# Patient Record
Sex: Female | Born: 1960 | Race: Black or African American | Hispanic: No | Marital: Single | Smoking: Never smoker
Health system: Southern US, Community
[De-identification: ages and names within clinical notes are randomized; demographics above are authoritative.]

## PROBLEM LIST (undated history)

## (undated) DIAGNOSIS — E785 Hyperlipidemia, unspecified: Secondary | ICD-10-CM

## (undated) DIAGNOSIS — Q039 Congenital hydrocephalus, unspecified: Secondary | ICD-10-CM

## (undated) DIAGNOSIS — F79 Unspecified intellectual disabilities: Secondary | ICD-10-CM

## (undated) DIAGNOSIS — K219 Gastro-esophageal reflux disease without esophagitis: Secondary | ICD-10-CM

## (undated) DIAGNOSIS — K5909 Other constipation: Secondary | ICD-10-CM

## (undated) DIAGNOSIS — G40909 Epilepsy, unspecified, not intractable, without status epilepticus: Secondary | ICD-10-CM

## (undated) DIAGNOSIS — D128 Benign neoplasm of rectum: Secondary | ICD-10-CM

## (undated) DIAGNOSIS — R569 Unspecified convulsions: Secondary | ICD-10-CM

## (undated) DIAGNOSIS — I10 Essential (primary) hypertension: Secondary | ICD-10-CM

## (undated) HISTORY — DX: Benign neoplasm of rectum: D12.8

## (undated) HISTORY — DX: Unspecified intellectual disabilities: F79

## (undated) HISTORY — DX: Epilepsy, unspecified, not intractable, without status epilepticus: G40.909

## (undated) HISTORY — DX: Essential (primary) hypertension: I10

## (undated) HISTORY — PX: OTHER SURGICAL HISTORY: SHX169

## (undated) HISTORY — DX: Congenital hydrocephalus, unspecified: Q03.9

## (undated) HISTORY — DX: Gastro-esophageal reflux disease without esophagitis: K21.9

## (undated) HISTORY — DX: Hyperlipidemia, unspecified: E78.5

## (undated) HISTORY — DX: Other constipation: K59.09

---

## 2000-09-21 ENCOUNTER — Encounter: Payer: Self-pay | Admitting: Family Medicine

## 2000-09-21 ENCOUNTER — Ambulatory Visit (HOSPITAL_COMMUNITY): Admission: RE | Admit: 2000-09-21 | Discharge: 2000-09-21 | Payer: Self-pay | Admitting: Family Medicine

## 2002-04-18 ENCOUNTER — Other Ambulatory Visit: Admission: RE | Admit: 2002-04-18 | Discharge: 2002-04-18 | Payer: Self-pay | Admitting: Obstetrics and Gynecology

## 2002-05-04 ENCOUNTER — Encounter: Payer: Self-pay | Admitting: Obstetrics and Gynecology

## 2002-05-04 ENCOUNTER — Ambulatory Visit (HOSPITAL_COMMUNITY): Admission: RE | Admit: 2002-05-04 | Discharge: 2002-05-04 | Payer: Self-pay | Admitting: Obstetrics and Gynecology

## 2002-05-11 ENCOUNTER — Encounter: Admission: RE | Admit: 2002-05-11 | Discharge: 2002-05-11 | Payer: Self-pay | Admitting: Obstetrics and Gynecology

## 2002-05-11 ENCOUNTER — Encounter: Payer: Self-pay | Admitting: Obstetrics and Gynecology

## 2003-04-16 ENCOUNTER — Ambulatory Visit (HOSPITAL_COMMUNITY): Admission: RE | Admit: 2003-04-16 | Discharge: 2003-04-16 | Payer: Self-pay | Admitting: Family Medicine

## 2003-04-23 ENCOUNTER — Emergency Department (HOSPITAL_COMMUNITY): Admission: EM | Admit: 2003-04-23 | Discharge: 2003-04-24 | Payer: Self-pay | Admitting: Emergency Medicine

## 2003-04-27 ENCOUNTER — Ambulatory Visit (HOSPITAL_COMMUNITY): Admission: RE | Admit: 2003-04-27 | Discharge: 2003-04-27 | Payer: Self-pay | Admitting: Internal Medicine

## 2003-04-30 ENCOUNTER — Ambulatory Visit (HOSPITAL_COMMUNITY): Admission: RE | Admit: 2003-04-30 | Discharge: 2003-04-30 | Payer: Self-pay | Admitting: Internal Medicine

## 2003-05-14 ENCOUNTER — Ambulatory Visit (HOSPITAL_COMMUNITY): Admission: RE | Admit: 2003-05-14 | Discharge: 2003-05-14 | Payer: Self-pay | Admitting: Internal Medicine

## 2003-08-14 ENCOUNTER — Ambulatory Visit (HOSPITAL_COMMUNITY): Admission: RE | Admit: 2003-08-14 | Discharge: 2003-08-14 | Payer: Self-pay | Admitting: Internal Medicine

## 2003-08-14 HISTORY — PX: FLEXIBLE SIGMOIDOSCOPY: SHX1649

## 2004-10-24 ENCOUNTER — Ambulatory Visit: Payer: Self-pay | Admitting: Internal Medicine

## 2004-10-24 ENCOUNTER — Encounter: Payer: Self-pay | Admitting: Family Medicine

## 2005-09-18 ENCOUNTER — Ambulatory Visit: Payer: Self-pay | Admitting: Family Medicine

## 2005-11-12 ENCOUNTER — Ambulatory Visit: Payer: Self-pay | Admitting: Family Medicine

## 2005-11-12 ENCOUNTER — Ambulatory Visit: Payer: Self-pay | Admitting: Internal Medicine

## 2005-12-25 ENCOUNTER — Ambulatory Visit: Payer: Self-pay | Admitting: Family Medicine

## 2006-02-08 ENCOUNTER — Encounter (INDEPENDENT_AMBULATORY_CARE_PROVIDER_SITE_OTHER): Payer: Self-pay | Admitting: *Deleted

## 2006-02-08 ENCOUNTER — Ambulatory Visit (HOSPITAL_COMMUNITY): Admission: RE | Admit: 2006-02-08 | Discharge: 2006-02-08 | Payer: Self-pay | Admitting: Internal Medicine

## 2006-02-08 ENCOUNTER — Ambulatory Visit: Payer: Self-pay | Admitting: Internal Medicine

## 2006-02-08 HISTORY — PX: COLONOSCOPY: SHX174

## 2006-04-27 ENCOUNTER — Encounter: Payer: Self-pay | Admitting: Family Medicine

## 2006-04-27 LAB — CONVERTED CEMR LAB
Albumin: 4.6 g/dL (ref 3.5–5.2)
Alkaline Phosphatase: 69 units/L (ref 39–117)
BUN: 5 mg/dL — ABNORMAL LOW (ref 6–23)
CO2: 27 meq/L (ref 19–32)
Chloride: 92 meq/L — ABNORMAL LOW (ref 96–112)
Creatinine, Ser: 0.63 mg/dL (ref 0.40–1.20)
HDL: 52 mg/dL (ref >39)
Indirect Bilirubin: 0.3 mg/dL (ref 0.0–0.9)
LDL Cholesterol: 93 mg/dL (ref 0–99)
Total Bilirubin: 0.4 mg/dL (ref 0.3–1.2)

## 2006-05-19 ENCOUNTER — Ambulatory Visit: Payer: Self-pay | Admitting: Family Medicine

## 2006-11-19 ENCOUNTER — Ambulatory Visit: Payer: Self-pay | Admitting: Internal Medicine

## 2006-11-19 ENCOUNTER — Encounter: Payer: Self-pay | Admitting: Family Medicine

## 2006-11-19 LAB — CONVERTED CEMR LAB
AST: 13 units/L (ref 0–37)
Alkaline Phosphatase: 104 units/L (ref 39–117)
BUN: 10 mg/dL (ref 6–23)
Basophils Absolute: 0 10*3/uL (ref 0.0–0.1)
Bilirubin, Direct: 0.1 mg/dL (ref 0.0–0.3)
CO2: 30 meq/L (ref 19–32)
Calcium: 9.9 mg/dL (ref 8.4–10.5)
Creatinine, Ser: 0.66 mg/dL (ref 0.40–1.20)
Eosinophils Relative: 2 % (ref 0–5)
Glucose, Bld: 94 mg/dL (ref 70–99)
HCT: 40.7 % (ref 36.0–46.0)
Hemoglobin: 13.6 g/dL (ref 12.0–15.0)
Indirect Bilirubin: 0.2 mg/dL (ref 0.0–0.9)
LDL Cholesterol: 92 mg/dL (ref 0–99)
Lymphocytes Relative: 34 % (ref 12–46)
Monocytes Absolute: 0.7 10*3/uL (ref 0.2–0.7)
Monocytes Relative: 10 % (ref 3–11)
RBC: 4.57 M/uL (ref 3.87–5.11)
RDW: 12.8 % (ref 11.5–14.0)
Total Bilirubin: 0.3 mg/dL (ref 0.3–1.2)

## 2006-12-06 ENCOUNTER — Ambulatory Visit: Payer: Self-pay | Admitting: Family Medicine

## 2007-01-20 ENCOUNTER — Encounter: Payer: Self-pay | Admitting: Family Medicine

## 2007-04-08 ENCOUNTER — Ambulatory Visit: Payer: Self-pay | Admitting: Family Medicine

## 2007-08-12 ENCOUNTER — Ambulatory Visit: Payer: Self-pay | Admitting: Family Medicine

## 2007-08-17 DIAGNOSIS — F79 Unspecified intellectual disabilities: Secondary | ICD-10-CM | POA: Insufficient documentation

## 2007-08-17 DIAGNOSIS — I10 Essential (primary) hypertension: Secondary | ICD-10-CM

## 2007-08-17 DIAGNOSIS — K5909 Other constipation: Secondary | ICD-10-CM

## 2007-08-17 DIAGNOSIS — Q039 Congenital hydrocephalus, unspecified: Secondary | ICD-10-CM | POA: Insufficient documentation

## 2007-08-17 DIAGNOSIS — E785 Hyperlipidemia, unspecified: Secondary | ICD-10-CM

## 2007-08-17 DIAGNOSIS — R569 Unspecified convulsions: Secondary | ICD-10-CM

## 2007-10-20 ENCOUNTER — Encounter: Payer: Self-pay | Admitting: Family Medicine

## 2007-11-18 ENCOUNTER — Ambulatory Visit: Payer: Self-pay | Admitting: Internal Medicine

## 2007-11-23 ENCOUNTER — Encounter: Payer: Self-pay | Admitting: Family Medicine

## 2007-12-03 ENCOUNTER — Encounter: Payer: Self-pay | Admitting: Family Medicine

## 2007-12-03 LAB — CONVERTED CEMR LAB
AST: 17 units/L (ref 0–37)
Albumin: 4.3 g/dL (ref 3.5–5.2)
Alkaline Phosphatase: 104 units/L (ref 39–117)
BUN: 9 mg/dL (ref 6–23)
Basophils Absolute: 0 10*3/uL (ref 0.0–0.1)
Basophils Relative: 0 % (ref 0–1)
CO2: 27 meq/L (ref 19–32)
Calcium: 9.4 mg/dL (ref 8.4–10.5)
Chloride: 95 meq/L — ABNORMAL LOW (ref 96–112)
Creatinine, Ser: 0.59 mg/dL (ref 0.40–1.20)
Eosinophils Relative: 2 % (ref 0–5)
HCT: 40.9 % (ref 36.0–46.0)
HDL: 41 mg/dL (ref >39)
Hemoglobin: 13.8 g/dL (ref 12.0–15.0)
Indirect Bilirubin: 0.2 mg/dL (ref 0.0–0.9)
LDL Cholesterol: 98 mg/dL (ref 0–99)
MCHC: 33.7 g/dL (ref 30.0–36.0)
Monocytes Absolute: 0.7 10*3/uL (ref 0.1–1.0)
Monocytes Relative: 9 % (ref 3–12)
RBC: 4.6 M/uL (ref 3.87–5.11)
RDW: 12.5 % (ref 11.5–15.5)
Total Bilirubin: 0.3 mg/dL (ref 0.3–1.2)
Triglycerides: 120 mg/dL (ref <150)

## 2007-12-09 ENCOUNTER — Ambulatory Visit: Payer: Self-pay | Admitting: Family Medicine

## 2007-12-23 ENCOUNTER — Ambulatory Visit: Payer: Self-pay | Admitting: Family Medicine

## 2008-02-01 ENCOUNTER — Encounter: Payer: Self-pay | Admitting: Family Medicine

## 2008-02-29 ENCOUNTER — Encounter: Payer: Self-pay | Admitting: Family Medicine

## 2008-04-02 ENCOUNTER — Encounter: Payer: Self-pay | Admitting: Family Medicine

## 2008-05-18 ENCOUNTER — Ambulatory Visit: Payer: Self-pay | Admitting: Family Medicine

## 2008-05-25 DIAGNOSIS — H6120 Impacted cerumen, unspecified ear: Secondary | ICD-10-CM

## 2008-05-30 ENCOUNTER — Encounter: Payer: Self-pay | Admitting: Family Medicine

## 2008-06-15 ENCOUNTER — Encounter: Payer: Self-pay | Admitting: Family Medicine

## 2008-06-19 LAB — CONVERTED CEMR LAB
ALT: 13 units/L (ref 0–35)
AST: 17 units/L (ref 0–37)
Albumin: 4.7 g/dL (ref 3.5–5.2)
Alkaline Phosphatase: 116 units/L (ref 39–117)
BUN: 9 mg/dL (ref 6–23)
Bilirubin, Direct: 0.1 mg/dL (ref 0.0–0.3)
CO2: 27 meq/L (ref 19–32)
Calcium: 9.8 mg/dL (ref 8.4–10.5)
Chloride: 98 meq/L (ref 96–112)
Cholesterol: 169 mg/dL (ref 0–200)
Creatinine, Ser: 0.72 mg/dL (ref 0.40–1.20)
Glucose, Bld: 81 mg/dL (ref 70–99)
HDL: 45 mg/dL (ref >39)
Indirect Bilirubin: 0.2 mg/dL (ref 0.0–0.9)
LDL Cholesterol: 107 mg/dL — ABNORMAL HIGH (ref 0–99)
Potassium: 4.2 meq/L (ref 3.5–5.3)
Sodium: 140 meq/L (ref 135–145)
Total Bilirubin: 0.3 mg/dL (ref 0.3–1.2)
Total CHOL/HDL Ratio: 3.8
Total Protein: 7.5 g/dL (ref 6.0–8.3)
Triglycerides: 86 mg/dL (ref <150)
VLDL: 17 mg/dL (ref 0–40)

## 2008-08-29 ENCOUNTER — Encounter: Payer: Self-pay | Admitting: Family Medicine

## 2008-10-15 ENCOUNTER — Encounter: Payer: Self-pay | Admitting: Family Medicine

## 2008-11-09 ENCOUNTER — Ambulatory Visit: Payer: Self-pay | Admitting: Family Medicine

## 2008-11-23 ENCOUNTER — Encounter: Payer: Self-pay | Admitting: Family Medicine

## 2008-11-29 DIAGNOSIS — K219 Gastro-esophageal reflux disease without esophagitis: Secondary | ICD-10-CM

## 2008-11-30 ENCOUNTER — Ambulatory Visit: Payer: Self-pay | Admitting: Gastroenterology

## 2008-11-30 DIAGNOSIS — Z860100 Personal history of colon polyps, unspecified: Secondary | ICD-10-CM | POA: Insufficient documentation

## 2008-12-04 ENCOUNTER — Telehealth (INDEPENDENT_AMBULATORY_CARE_PROVIDER_SITE_OTHER): Payer: Self-pay

## 2008-12-27 ENCOUNTER — Encounter: Payer: Self-pay | Admitting: Family Medicine

## 2009-04-12 ENCOUNTER — Ambulatory Visit: Payer: Self-pay | Admitting: Family Medicine

## 2009-04-17 ENCOUNTER — Telehealth: Payer: Self-pay | Admitting: Family Medicine

## 2009-04-17 ENCOUNTER — Encounter: Payer: Self-pay | Admitting: Family Medicine

## 2009-04-17 LAB — CONVERTED CEMR LAB
ALT: 21 units/L (ref 0–35)
Alkaline Phosphatase: 99 units/L (ref 39–117)
BUN: 9 mg/dL (ref 6–23)
Basophils Absolute: 0 10*3/uL (ref 0.0–0.1)
Bilirubin, Direct: 0.1 mg/dL (ref 0.0–0.3)
Cholesterol: 170 mg/dL (ref 0–200)
Creatinine, Ser: 0.74 mg/dL (ref 0.40–1.20)
Eosinophils Absolute: 0.2 10*3/uL (ref 0.0–0.7)
Eosinophils Relative: 2 % (ref 0–5)
Glucose, Bld: 97 mg/dL (ref 70–99)
HCT: 45.4 % (ref 36.0–46.0)
Hemoglobin: 14.8 g/dL (ref 12.0–15.0)
Indirect Bilirubin: 0.3 mg/dL (ref 0.0–0.9)
LDL Cholesterol: 111 mg/dL — ABNORMAL HIGH (ref 0–99)
Lymphocytes Relative: 36 % (ref 12–46)
MCHC: 32.6 g/dL (ref 30.0–36.0)
MCV: 91.9 fL (ref 78.0–100.0)
Monocytes Absolute: 0.6 10*3/uL (ref 0.1–1.0)
Platelets: 223 10*3/uL (ref 150–400)
Potassium: 3.8 meq/L (ref 3.5–5.3)
RDW: 12.8 % (ref 11.5–15.5)
VLDL: 21 mg/dL (ref 0–40)

## 2009-04-18 LAB — CONVERTED CEMR LAB: Vit D, 25-Hydroxy: <10 ng/mL — ABNORMAL LOW (ref 30–89)

## 2009-04-30 ENCOUNTER — Telehealth: Payer: Self-pay | Admitting: Family Medicine

## 2009-06-28 ENCOUNTER — Telehealth: Payer: Self-pay | Admitting: Family Medicine

## 2009-07-09 ENCOUNTER — Ambulatory Visit (HOSPITAL_COMMUNITY): Admission: RE | Admit: 2009-07-09 | Discharge: 2009-07-09 | Payer: Self-pay | Admitting: Family Medicine

## 2009-07-09 ENCOUNTER — Ambulatory Visit: Payer: Self-pay | Admitting: Family Medicine

## 2009-07-09 DIAGNOSIS — J209 Acute bronchitis, unspecified: Secondary | ICD-10-CM

## 2009-07-09 LAB — CONVERTED CEMR LAB
BUN: 8 mg/dL (ref 6–23)
CO2: 29 meq/L (ref 19–32)
Chloride: 98 meq/L (ref 96–112)
Eosinophils Relative: 1 % (ref 0–5)
Glucose, Bld: 119 mg/dL — ABNORMAL HIGH (ref 70–99)
HCT: 43.6 % (ref 36.0–46.0)
Hemoglobin: 14.4 g/dL (ref 12.0–15.0)
Lymphocytes Relative: 32 % (ref 12–46)
Lymphs Abs: 2.6 10*3/uL (ref 0.7–4.0)
MCV: 92.4 fL (ref 78.0–100.0)
Monocytes Absolute: 0.6 10*3/uL (ref 0.1–1.0)
Potassium: 4.3 meq/L (ref 3.5–5.3)
WBC: 8.3 10*3/uL (ref 4.0–10.5)

## 2009-08-17 ENCOUNTER — Encounter: Payer: Self-pay | Admitting: Family Medicine

## 2009-09-06 ENCOUNTER — Encounter: Payer: Self-pay | Admitting: Family Medicine

## 2009-10-18 ENCOUNTER — Ambulatory Visit: Payer: Self-pay | Admitting: Family Medicine

## 2009-10-21 ENCOUNTER — Encounter: Payer: Self-pay | Admitting: Family Medicine

## 2009-10-31 ENCOUNTER — Ambulatory Visit: Payer: Self-pay | Admitting: Family Medicine

## 2009-11-03 DIAGNOSIS — L819 Disorder of pigmentation, unspecified: Secondary | ICD-10-CM | POA: Insufficient documentation

## 2009-11-07 ENCOUNTER — Encounter (INDEPENDENT_AMBULATORY_CARE_PROVIDER_SITE_OTHER): Payer: Self-pay | Admitting: *Deleted

## 2009-11-21 ENCOUNTER — Telehealth: Payer: Self-pay | Admitting: Family Medicine

## 2009-11-21 DIAGNOSIS — L989 Disorder of the skin and subcutaneous tissue, unspecified: Secondary | ICD-10-CM | POA: Insufficient documentation

## 2009-11-27 ENCOUNTER — Encounter: Payer: Self-pay | Admitting: Family Medicine

## 2010-01-01 ENCOUNTER — Ambulatory Visit: Payer: Self-pay | Admitting: Internal Medicine

## 2010-02-18 NOTE — Progress Notes (Signed)
  Phone Note From Pharmacy   Caller: [facility]* Summary of Call: omeprazole requires pa insurance allowable is #90 every year Initial call taken by: Adella Hare LPN,  April 30, 2009 8:42 AM  Follow-up for Phone Call        pls check and se if they will do omeprazole 20mg  Take 1 capsule by mouth once a day #30 refill 11, a l;ower dose, if so let family knowwhy doseis decresed , and they should call if she appears to dev stomach pain Follow-up by: Syliva Overman MD,  April 30, 2009 12:11 PM  Additional Follow-up for Phone Call Additional follow up Details #1::        this is dose patient is currently on Additional Follow-up by: Adella Hare LPN,  April 30, 2009 4:01 PM    Additional Follow-up for Phone Call Additional follow up Details #2::    trial of  ranitidine in place of omeprazole, plsadvise pt and pharmacy, stamp and fax Follow-up by: Syliva Overman MD,  April 30, 2009 5:14 PM  Additional Follow-up for Phone Call Additional follow up Details #3:: Details for Additional Follow-up Action Taken: sent rx, caretaker aware Additional Follow-up by: Adella Hare LPN,  May 01, 2009 11:50 AM  New/Updated Medications: RANITIDINE HCL 150 MG TABS (RANITIDINE HCL) Take 1 tablet by mouth once a day Prescriptions: RANITIDINE HCL 150 MG TABS (RANITIDINE HCL) Take 1 tablet by mouth once a day  #30 x 11   Entered and Authorized by:   Syliva Overman MD   Signed by:   Syliva Overman MD on 04/30/2009   Method used:   Printed then faxed to ...       [facility] (retail)       [address]       [place], [place]  [address]       Ph: [phone]       Fax: [phone]   RxID:   [id number]

## 2010-02-18 NOTE — Letter (Signed)
Summary: med review sheet  med review sheet   Imported By: Rudene Anda 11/01/2009 15:55:51  _____________________________________________________________________  External Attachment:    Type:   Image     Comment:   External Document

## 2010-02-18 NOTE — Progress Notes (Signed)
Summary: CALL BACK  Phone Note Call from Patient   Summary of Call: [name] WANTS YOU TO CALL HER ABOUT [place] Initial call taken by: Lind Guest,  April 17, 2009 9:29 AM  Follow-up for Phone Call        spoke with her already  Follow-up by: Everitt Amber LPN,  April 17, 2009 12:59 PM

## 2010-02-18 NOTE — Progress Notes (Signed)
Summary: COUGH  Phone Note Call from Patient   Summary of Call: [name] CALLED AND SHE HAS A COUGH CONGESTED AND  NO FEVER OR ANYTHING SHE HAS NOBODY TO HELP BRING HER HERE HER BROTHER IS OUT OF TOWN AND WANTS TO KNOW WILL YOU CALL SOMETHING INTO [place] [facility] CALL BACK AT [phone] Initial call taken by: Lind Guest,  June 28, 2009 8:41 AM  Follow-up for Phone Call        advise her i wiill sendin tessalon perles, but this is probably not covered, if shethinks it's too much, she can give her robitussin dm one tsp 3 times daily for 5 days, then as needed Follow-up by: Syliva Overman MD,  June 28, 2009 12:21 PM  Additional Follow-up for Phone Call Additional follow up Details #1::        Aware Additional Follow-up by: Everitt Amber LPN,  June 28, 2009 4:08 PM    New/Updated Medications: TESSALON PERLES 100 MG CAPS (BENZONATATE) Take 1 capsule by mouth three times a day as needed Prescriptions: TESSALON PERLES 100 MG CAPS (BENZONATATE) Take 1 capsule by mouth three times a day as needed  #42 x 0   Entered and Authorized by:   Syliva Overman MD   Signed by:   Syliva Overman MD on 06/28/2009   Method used:   Electronically to        [facility] (retail)       [address]       [place], [place]  [address]       Ph: [phone]       Fax: [phone]   RxID:   [id number]

## 2010-02-18 NOTE — Letter (Signed)
Summary: CAPS  CAPS   Imported By: Lind Guest 10/22/2009 13:26:29  _____________________________________________________________________  External Attachment:    Type:   Image     Comment:   External Document

## 2010-02-18 NOTE — Medication Information (Signed)
Summary: wheelchair  wheelchair   Imported By: Curtis Sites 08/22/2009 14:45:58  _____________________________________________________________________  External Attachment:    Type:   Image     Comment:   External Document

## 2010-02-18 NOTE — Miscellaneous (Signed)
  Clinical Lists Changes  Medications: Removed medication of VITAMIN D (ERGOCALCIFEROL) 50000 UNIT CAPS (ERGOCALCIFEROL) one cap by mouth every week

## 2010-02-18 NOTE — Letter (Signed)
Summary: gastroerterology  gastroerterology   Imported By: Lind Guest 10/17/2009 15:07:43  _____________________________________________________________________  External Attachment:    Type:   Image     Comment:   External Document

## 2010-02-18 NOTE — Medication Information (Signed)
Summary: wheelchair   wheelchair   Imported By: Curtis Sites 08/20/2009 13:49:26  _____________________________________________________________________  External Attachment:    Type:   Image     Comment:   External Document

## 2010-02-18 NOTE — Assessment & Plan Note (Signed)
Summary: cough   Vital Signs:  Patient profile:   50 year old female O2 Sat:      97 % Pulse rate:   71 / minute Pulse rhythm:   regular Resp:     16 per minute BP sitting:   122 / 80  (left arm)  Vitals Entered By: Everitt Amber LPN (July 09, 2009 11:11 AM) CC: has a cough and is unable to cough anything up. Has been going on for over a week. The pills that were called in didn't help   Primary Care Provider:  Syliva Overman, MD  CC:  has a cough and is unable to cough anything up. Has been going on for over a week. The pills that were called in didn't help.  History of Present Illness: 10 day h/o chest congestion , no response top decongestants, apetite somewaht reduced. c/o left angle of mouth and rash around mouth x 5 days. no fevre or chills noted, energy level appears unchanged. Reports  that prior to this she had been doing well. Denies recent fever or chills. Denies sinus pressure, or nasal congestion ,  . Denies , orthopnea or leg swelling. Denies abdominal pain, nausea, vomitting, diarrhea or constipation. Denies change in bowel movements or bloody stool. Denies dysuria or  frequency, chronic reduced mobility of lower ext with lower ext weakness, and inability to weight bear or ambulate Denies, seizures. Denies depression, anxiety or insomnia.      Allergies (verified): No Known Drug Allergies  Review of Systems      See HPI Eyes:  Denies discharge and red eye. Derm:  Complains of itching, lesion(s), and rash; red rash around lower mouth x approx 5 days, and itching at corners of the mouth. Heme:  Denies abnormal bruising and bleeding.  Physical Exam  General:  alert, well-nourished, and well-hydrated.hydrocephalus. HEENT: No facial asymmetry,  EOMI, No sinus tenderness, TM's cler  bilaterally, oropharynx  pink and moist. Neck supple , no adenopathy  Chest: dewcreased air entry in the bases , with bibasilar crackles, no wheezes CVS: S1, S2, No murmurs,  No S3.   Abd: Soft, Nontender.  MS: decreaseede  ROM spine, hips, shoulders and knees.  Ext: No edema.   CNS: no facial assymetry, EOMI, vision grossly normal, hearing grossly normal,sensation normal throughout.Decreased power and tone.   Skin: Intact, erythematous maculo[papular rash on chin.  Psych: Good eye contact, normal affect.  , not anxious or depressed appearing.      Impression & Recommendations:  Problem # 1:  ACUTE BRONCHITIS (ICD-466.0) Assessment Comment Only  Her updated medication list for this problem includes:    Tessalon Perles 100 Mg Caps (Benzonatate) .Marland Kitchen... Take 1 capsule by mouth three times a day as needed    Penicillin V Potassium 500 Mg Tabs (Penicillin v potassium) .Marland Kitchen... Take 1 tablet by mouth three times a day  Orders: CXR- 2view (CXR) T-CBC w/Diff ([id number]) T-Basic Metabolic Panel ([id number]) Rocephin  250mg  (G9562) Admin of Therapeutic Inj  intramuscular or subcutaneous (13086) Albuterol Sulfate Sol 1mg  unit dose (V7846) Atrovent 1mg  (Neb) (N6295) Nebulizer Tx (28413)  Problem # 2:  UNSPECIFIED CONSTIPATION (ICD-564.00) Assessment: Improved  Her updated medication list for this problem includes:    Docusate Sodium 100 Mg Caps (Docusate sodium) .Marland Kitchen... Take 1 tablet by mouth two times a day  Problem # 3:  SEIZURE DISORDER (ICD-780.39) Assessment: Unchanged  Her updated medication list for this problem includes:    Phenobarbital 30 Mg Tabs (Phenobarbital) .Marland Kitchen... Take 1  tablet by mouth two times a day no seizure activity  Problem # 4:  HYPERTENSION (ICD-401.9) Assessment: Unchanged  Her updated medication list for this problem includes:    Bisoprolol-hydrochlorothiazide 5-6.25 Mg Tabs (Bisoprolol-hydrochlorothiazide) .Marland Kitchen... Take 1 tablet by mouth once a day  Orders: T-Basic Metabolic Panel [phone])  BP today: 122/80 Prior BP: 120/80 (04/12/2009)  Labs Reviewed: K+: 3.8 (04/12/2009) Creat: : 0.74 (04/12/2009)   Chol: 170  (04/12/2009)   HDL: 38 (04/12/2009)   LDL: 111 (04/12/2009)   TG: 103 (04/12/2009)  Complete Medication List: 1)  Polyethylene Glycol 3350 Liqd (Polyethylene glycol 3350) .... Swallow 17gm in 8oz h20 twice daily 2)  Simvastatin 10 Mg Tabs (Simvastatin) .... Take 1 tab by mouth at bedtime 3)  Docusate Sodium 100 Mg Caps (Docusate sodium) .... Take 1 tablet by mouth two times a day 4)  Bisoprolol-hydrochlorothiazide 5-6.25 Mg Tabs (Bisoprolol-hydrochlorothiazide) .... Take 1 tablet by mouth once a day 5)  Klor-con M10 10 Meq Cr-tabs (Potassium chloride crys cr) .... Take 1 tablet by mouth once a day 6)  Phenobarbital 30 Mg Tabs (Phenobarbital) .... Take 1 tablet by mouth two times a day 7)  Vitamin D (ergocalciferol) 50000 Unit Caps (Ergocalciferol) .... One cap by mouth every week 8)  Ranitidine Hcl 150 Mg Tabs (Ranitidine hcl) .... Take 1 tablet by mouth once a day 9)  Tessalon Perles 100 Mg Caps (Benzonatate) .... Take 1 capsule by mouth three times a day as needed 10)  Penicillin V Potassium 500 Mg Tabs (Penicillin v potassium) .... Take 1 tablet by mouth three times a day 11)  Prednisone 5 Mg Tabs (Prednisone) .... One tablet twice daily for 2 days then one tablet daily for 3 days  Patient Instructions: 1)  f/u as before. 2)  You are being treated for acute bronchitis. 3)  You will get a breathing treatment and an injection of Rocephin in the office today. Meds are als sent to [facility], It is vital that you take them all. 4)  For the rash on your face use a small quantity of hydrocortisone cream twice daily for the next 5 days. 5)  you may use pure vaseline to the corner of you mouth till it stops hurting, use 2 to 3 times daily   6)  CBC and diff and chem 7 today 7)  CXR today if possible Prescriptions: PREDNISONE 5 MG TABS (PREDNISONE) one tablet twice daily for 2 days then one tablet daily for 3 days  #7 x 0   Entered and Authorized by:   Syliva Overman MD   Signed by:    Syliva Overman MD on 07/09/2009   Method used:   Electronically to        [facility] (retail)       [address]       [place], [place]  [address]       Ph: [phone]       Fax: [phone]   RxID:   [id number] PENICILLIN V POTASSIUM 500 MG TABS (PENICILLIN V POTASSIUM) Take 1 tablet by mouth three times a day  #30 x 0   Entered and Authorized by:   Syliva Overman MD   Signed by:   Syliva Overman MD on 07/09/2009   Method used:   Electronically to        [facility] (retail)       [address]  [place], [place]  [address]       Ph: [phone]       Fax: [phone]   RxID:   [id number]    Medication Administration  Injection # 1:    Medication: Rocephin  250mg     Diagnosis: ACUTE BRONCHITIS (ICD-466.0)    Route: IM    Site: R deltoid    Exp Date: 4/13    Lot #: [id number]    Mfr: [email or web address]    Comments: rocephin 500mg  given    Patient tolerated injection without complications    Given by: Adella Hare LPN (July 09, 2009 12:05 PM)  Medication # 1:    Medication: Albuterol Sulfate Sol 1mg  unit dose    Diagnosis: ACUTE BRONCHITIS (ICD-466.0)    Dose: 2.5/57ml    Route: inhaled    Exp Date: 8/11    Lot #: [id number]    Mfr: nephron    Patient tolerated medication without complications    Given by: Everitt Amber LPN (July 10, 2009 9:03 AM)  Medication # 2:    Medication: Atrovent 1mg  (Neb)    Diagnosis: ACUTE BRONCHITIS (ICD-466.0)    Dose: 0.5/2.35ml    Route: inhaled    Exp Date: 8/11    Lot #: [id number]    Mfr: nephron    Patient tolerated medication without complications    Given by: Everitt Amber LPN (July 10, 2009 9:04 AM)  Orders Added: 1)  Est. Patient Level IV [40347] 2)  CXR- 2view [CXR] 3)  T-CBC w/Diff [[id number]] 4)  T-Basic Metabolic Panel [[id number]] 5)  Rocephin  250mg  [J0696] 6)  Admin of Therapeutic Inj  intramuscular or subcutaneous [96372] 7)  Albuterol  Sulfate Sol 1mg  unit dose [J7613] 8)  Atrovent 1mg  (Neb) [I4332] 9)  Nebulizer Tx [95188]

## 2010-02-18 NOTE — Letter (Signed)
Summary: Recall Office Visit  [facility]  [address]   [place], [place] [address]   Phone: [phone]  Fax: [phone]      November 07, 2009   April Watson [address] [place], [place]  [address] 08-21-1960   Dear [name],   According to our records, it is time for you to schedule a follow-up office visit with [place].   At your convenience, please call [phone] to schedule an office visit. If you have any questions, concerns, or feel that this letter is in error, we would appreciate your call.   Sincerely,    Rosine Beat  [facility] Ph: [phone]   Fax: [phone]

## 2010-02-18 NOTE — Miscellaneous (Signed)
Summary: CAPS  CAPS   Imported By: Lind Guest 10/22/2009 13:26:04  _____________________________________________________________________  External Attachment:    Type:   Image     Comment:   External Document

## 2010-02-18 NOTE — Assessment & Plan Note (Signed)
Summary: flu shot  Nurse Visit   Vitals Entered By: Everitt Amber LPN (October 18, 2009 10:06 AM) Comments Patient in to recieve flu vaccine     Allergies: No Known Drug Allergies  Orders Added: 1)  Influenza Vaccine NON MCR [00028]   Influenza Vaccine    Vaccine Type: Fluvax Non-MCR    Site: left deltoid    Mfr: novartis    Dose: 0.5 ml    Route: IM    Given by: Everitt Amber LPN    Exp. Date: 05/2010    Lot #: [id number]

## 2010-02-18 NOTE — Assessment & Plan Note (Signed)
Summary: F UP   Vital Signs:  Patient profile:   50 year old female O2 Sat:      92 % on Room air Pulse rate:   72 / minute Pulse rhythm:   regular Resp:     16 per minute BP sitting:   120 / 88  (left arm)  Vitals Entered By: Mauricia Area CMA (October 31, 2009 1:12 PM)  O2 Flow:  Room air CC: follow up   Primary Care Thomasenia Dowse:  Syliva Overman, MD  CC:  follow up.  History of Present Illness: Pt has been stable and doing well per the report of her family members. Her only conern is a dark areaon the right 5th toe over the past 2 to 3 weeks following trauma. Her apetite is unchanged, her bowels move regularly, aDN SHE HAS NO NEW OR INCREASED HEAD OR CHEST CONGESTION.  Allergies (verified): No Known Drug Allergies  Review of Systems      See HPI Eyes:  Denies discharge and red eye. ENT:  Denies nasal congestion. CV:  Denies difficulty breathing at night, difficulty breathing while lying down, and swelling of feet. Resp:  Denies cough and sputum productive. GI:  Denies abdominal pain, constipation, diarrhea, loss of appetite, and nausea. GU:  Denies dysuria and urinary frequency. MS:  Complains of muscle weakness; PT  has cerebral palsy. Derm:  Complains of lesion(s); dark lesion on leright 5th toe, hought to be due to truma, approx 3 weeks ago. Neuro:  Complains of inability to speak; expressive aphasia. Endo:  Denies excessive thirst and excessive urination. Heme:  Denies abnormal bruising and bleeding. Allergy:  Complains of seasonal allergies; denies hives or rash and itching eyes.  Physical Exam  General:  alert, well-nourished, and well-hydrated.hydrocephalus. HEENT: No facial asymmetry,  EOMI, No sinus tenderness, TM's cerumen bilaterally, oropharynx  pink and moist. Neck supple , no adenopathy  Chest: dewcreased air entry in the bases , with bibasilar crackles, no wheezes CVS: S1, S2, No murmurs, No S3.   Abd: Soft, Nontender.  MS: decreaseede  ROM spine,  hips, shoulders and knees.  Ext: No edema.   CNS: no facial assymetry, EOMI, vision grossly normal, hearing grossly normal,sensation normal throughout.Decreased power and tone.   Skin: Intact, hyperpigmented area on right 5th toe resemblig old bleed under the skin with h/o trauma Psych: Good eye contact, normal affect.  , not anxious or depressed appearing.      Impression & Recommendations:  Problem # 1:  ACUTE BRONCHITIS (ICD-466.0) Assessment Improved  Her updated medication list for this problem includes:    Tessalon Perles 100 Mg Caps (Benzonatate) .Marland Kitchen... Take 1 capsule by mouth three times a day as needed    Penicillin V Potassium 500 Mg Tabs (Penicillin v potassium) .Marland Kitchen... Take 1 tablet by mouth three times a day  Problem # 2:  UNSPECIFIED CONSTIPATION (ICD-564.00) Assessment: Improved  Her updated medication list for this problem includes:    Docusate Sodium 100 Mg Caps (Docusate sodium) .Marland Kitchen... Take 1 tablet by mouth two times a day  Problem # 3:  SEIZURE DISORDER (ICD-780.39) Assessment: Unchanged  Her updated medication list for this problem includes:    Phenobarbital 30 Mg Tabs (Phenobarbital) .Marland Kitchen... Take 1 tablet by mouth two times a day no seizure activity   Problem # 4:  HYPERTENSION (ICD-401.9) Assessment: Unchanged  Her updated medication list for this problem includes:    Bisoprolol-hydrochlorothiazide 5-6.25 Mg Tabs (Bisoprolol-hydrochlorothiazide) .Marland Kitchen... Take 1 tablet by mouth once a day  BP today: 120/88 Prior BP: 122/80 (07/09/2009)  Labs Reviewed: K+: 4.3 (07/09/2009) Creat: : 0.71 (07/09/2009)   Chol: 170 (04/12/2009)   HDL: 38 (04/12/2009)   LDL: 111 (04/12/2009)   TG: 103 (04/12/2009)  Problem # 5:  HYPERLIPIDEMIA (ICD-272.4) Assessment: Comment Only  Her updated medication list for this problem includes:    Simvastatin 10 Mg Tabs (Simvastatin) .Marland Kitchen... Take 1 tab by mouth at bedtime  Labs Reviewed: SGOT: 15 (04/12/2009)   SGPT: 21 (04/12/2009)    HDL:38 (04/12/2009), 45 (06/15/2008)  LDL:111 (04/12/2009), 107 (06/15/2008)  Chol:170 (04/12/2009), 169 (06/15/2008)  Trig:103 (04/12/2009), 86 (06/15/2008)  Problem # 6:  DYSCHROMIA, UNSPECIFIED (ICD-709.00) Assessment: Comment Only familyto monitor lesion for increase in size and advised to call for derm referral if this happens I explaind that i believe it is a "bruise" and shopuld resolve , they understand  Complete Medication List: 1)  Polyethylene Glycol 3350 Liqd (Polyethylene glycol 3350) .... Swallow 17gm in 8oz h20 twice daily 2)  Simvastatin 10 Mg Tabs (Simvastatin) .... Take 1 tab by mouth at bedtime 3)  Docusate Sodium 100 Mg Caps (Docusate sodium) .... Take 1 tablet by mouth two times a day 4)  Bisoprolol-hydrochlorothiazide 5-6.25 Mg Tabs (Bisoprolol-hydrochlorothiazide) .... Take 1 tablet by mouth once a day 5)  Klor-con M10 10 Meq Cr-tabs (Potassium chloride crys cr) .... Take 1 tablet by mouth once a day 6)  Phenobarbital 30 Mg Tabs (Phenobarbital) .... Take 1 tablet by mouth two times a day 7)  Ranitidine Hcl 150 Mg Tabs (Ranitidine hcl) .... Take 1 tablet by mouth once a day 8)  Tessalon Perles 100 Mg Caps (Benzonatate) .... Take 1 capsule by mouth three times a day as needed 9)  Penicillin V Potassium 500 Mg Tabs (Penicillin v potassium) .... Take 1 tablet by mouth three times a day 10)  Prednisone 5 Mg Tabs (Prednisone) .... One tablet twice daily for 2 days then one tablet daily for 3 days  Patient Instructions: 1)  f/u in inn5 months and 3 weeks 2)  BMP prior to visit, ICD-9: 3)  Hepatic Panel prior to visit, ICD-9:   4)  Lipid Panel prior to visit, ICD-9:   fasting in January. 5)  pls keep an eye on the lesion on the right 5th toe, it should DECREASE in size, if it gets bigger then pls call so I can have a podiatrist look at it.I am assuming it is due to old blood from trauma. 6)  Keep well. 7)  Happy 50 when iit comes. 8)  no med changes

## 2010-02-18 NOTE — Letter (Signed)
Summary: WHEELCHAIR  WHEELCHAIR   Imported By: Lind Guest 09/06/2009 13:43:16  _____________________________________________________________________  External Attachment:    Type:   Image     Comment:   External Document

## 2010-02-18 NOTE — Miscellaneous (Signed)
Summary: Home Care Report  Home Care Report   Imported By: Lind Guest 09/06/2009 13:50:38  _____________________________________________________________________  External Attachment:    Type:   Image     Comment:   External Document

## 2010-02-18 NOTE — Letter (Signed)
Summary: [facility]   Imported By: Lind Guest 12/10/2009 17:29:46  _____________________________________________________________________  External Attachment:    Type:   Image     Comment:   External Document

## 2010-02-18 NOTE — Assessment & Plan Note (Signed)
Summary: office visit   Vital Signs:  Patient profile:   50 year old female O2 Sat:      98 % Pulse rate:   73 / minute Resp:     16 per minute BP sitting:   120 / 80  (left arm)  Vitals Entered By: Everitt Amber LPN (April 12, 2009 11:35 AM) CC: Follow up chronic problems   Primary Care Provider:  Syliva Overman, MD  CC:  Follow up chronic problems.  History of Present Illness: the family reports  that she has been doing well.The entire history is from the family as the pt is aphasic. Denies recent fever or chills. Denies sinus pressure, nasal congestion , ear pain or sore throat. Denies chest congestion, or cough productive of sputum. Denies chest pain, palpitations, PND, orthopnea or leg swelling. Denies abdominal pain, nausea, vomitting, diarrhea or constipation. Denies change in bowel movements or bloody stool. Denies dysuria , frequency, or malodoros urine. Denies  joint pain, she does have chronic reduced mobility.she is not ambulatory and is in a wheelchair. Denies headaches, vertigo, seizures.  Denies  rash, lesions, or itch.     Current Medications (verified): 1)  Polyethylene Glycol 3350   Liqd (Polyethylene Glycol 3350) .... Swallow 17gm in 8oz H20 Twice Daily 2)  Simvastatin 10 Mg  Tabs (Simvastatin) .... Take 1 Tab By Mouth At Bedtime 3)  Docusate Sodium 100 Mg  Caps (Docusate Sodium) .... Take 1 Tablet By Mouth Two Times A Day 4)  Bisoprolol-Hydrochlorothiazide 5-6.25 Mg  Tabs (Bisoprolol-Hydrochlorothiazide) .... Take 1 Tablet By Mouth Once A Day 5)  Klor-Con M10 10 Meq  Cr-Tabs (Potassium Chloride Crys Cr) .... Take 1 Tablet By Mouth Once A Day 6)  Phenobarbital 30 Mg  Tabs (Phenobarbital) .... Take 1 Tablet By Mouth Two Times A Day 7)  Omeprazole 20 Mg  Cpdr (Omeprazole) .... Take 1 Tablet By Mouth Once A Day  Allergies (verified): No Known Drug Allergies  Review of Systems      See HPI Eyes:  Denies discharge and red eye. Neuro:  Complains of  inability to speak, seizures, and weakness. Heme:  Denies abnormal bruising and bleeding. Allergy:  Denies persistent infections and sneezing.  Physical Exam  General:  alert, well-nourished, and well-hydrated.hydrocephalus. HEENT: No facial asymmetry,  EOMI, No sinus tenderness, TM's partial impaction bilaterally, oropharynx  pink and moist.   Chest: Clear to auscultation bilaterally.  CVS: S1, S2, No murmurs, No S3.   Abd: Soft, Nontender.  MS: decreaseed though adequate  ROM spine, hips, shoulders and knees.  Ext: No edema.   CNS: no facial assymetry, EOMI, vision grossly normal, hearing grossly normal,sensation normal throughout.Decreased power and tone.   Skin: Intact, no visible lesions or rashes.  Psych: Good eye contact, normal affect.  , not anxious or depressed appearing.      Impression & Recommendations:  Problem # 1:  CERUMEN IMPACTION, RECURRENT (ICD-380.4) Assessment Comment Only  bilateral ear irrigation succesfully done at [facility]  Orders: Cerumen Impaction Removal (04540)  Problem # 2:  HYPERTENSION (ICD-401.9) Assessment: Unchanged  Her updated medication list for this problem includes:    Bisoprolol-hydrochlorothiazide 5-6.25 Mg Tabs (Bisoprolol-hydrochlorothiazide) .Marland Kitchen... Take 1 tablet by mouth once a day  BP today: 120/80 Prior BP: 120/82 (11/30/2008)  Labs Reviewed: K+: 4.2 (06/15/2008) Creat: : 0.72 (06/15/2008)   Chol: 169 (06/15/2008)   HDL: 45 (06/15/2008)   LDL: 107 (06/15/2008)   TG: 86 (06/15/2008)  Problem # 3:  HYPERLIPIDEMIA (ICD-272.4) Assessment: Comment  Only  Her updated medication list for this problem includes:    Simvastatin 10 Mg Tabs (Simvastatin) .Marland Kitchen... 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 Swallow 17gm in 8oz h20 twice daily 2)  Simvastatin 10 Mg Tabs (Simvastatin) .... Take 1 tab by mouth at bedtime 3)  Docusate Sodium 100 Mg Caps (Docusate sodium) .... Take 1 tablet by mouth two times a day 4)  Bisoprolol-hydrochlorothiazide 5-6.25 Mg Tabs (Bisoprolol-hydrochlorothiazide) .... Take 1 tablet by mouth once a day 5)  Klor-con M10 10 Meq Cr-tabs (Potassium chloride crys cr) .... Take 1 tablet by mouth once a day 6)  Phenobarbital 30 Mg Tabs (Phenobarbital) .... Take 1 tablet by mouth two times a day 7)  Omeprazole 20 Mg Cpdr (Omeprazole) .... Take 1 tablet by mouth once a day  Patient Instructions: 1)  F/U   end september or early October. 2)  No med changes . 3)  I will look into the referral for assistance with caregivers, contact Laverle Pillard, tele [phone] Prescriptions: PHENOBARBITAL 30 MG  TABS (PHENOBARBITAL) Take 1 tablet by mouth two times a day  #60 x 6   Entered by:   Everitt Amber LPN   Authorized by:   Syliva Overman MD   Signed by:   Everitt Amber LPN on 91/47/8295   Method used:   Printed then faxed to ...       [facility] (retail)       [address]       [place], [place]  [address]       Ph: [phone]       Fax: [phone]   RxID:   [id number] BISOPROLOL-HYDROCHLOROTHIAZIDE 5-6.25 MG  TABS (BISOPROLOL-HYDROCHLOROTHIAZIDE) Take 1 tablet by mouth once a day  #30 Each x 6   Entered by:   Everitt Amber LPN   Authorized by:   Syliva Overman MD   Signed by:   Everitt Amber LPN on 42/59/5638   Method used:   Electronically to        [facility] (retail)       [address] [place], [place]  [address]       Ph: [phone]       Fax: [phone]   RxID:   [id number] DOCUSATE SODIUM 100 MG  CAPS (DOCUSATE SODIUM) Take 1 tablet by mouth two times a day  #60 Each x 6   Entered by:   Everitt Amber LPN   Authorized by:   Syliva Overman MD   Signed by:   Everitt Amber LPN on 07/12/7626   Method used:   Electronically to        [facility] (retail)       [address]       [place], [place]  [address]  Ph: [phone]       Fax: [phone]   RxID:   [id number]

## 2010-02-18 NOTE — Progress Notes (Signed)
Summary: foot looks worse  Phone Note Call from Patient   Summary of Call: [name] called for patient [phone] patient is complaining of righ foot pain, family member states it looks worse then when Dr. Anthony Sar looked at it. Initial call taken by: Curtis Sites,  November 21, 2009 9:49 AM  Follow-up for Phone Call        pls refer to podiatry to eval and check for convenient dya/time first pls since 2 family members will have to take her. I will also enter in referalbox, thanks Follow-up by: Syliva Overman MD,  November 21, 2009 12:01 PM  Additional Follow-up for Phone Call Additional follow up Details #1::        [name] has completed this referal Additional Follow-up by: Adella Hare LPN,  November 21, 2009 3:26 PM  New Problems: SKIN LESION (ICD-709.9)   New Problems: SKIN LESION (ICD-709.9)

## 2010-02-20 NOTE — Assessment & Plan Note (Signed)
Summary: ONE YEAR F/U/LAW   Visit Type:  Follow-up Visit Primary Care Provider:  simpson  Chief Complaint:  1 year follow up- doing good.  History of Present Illness: One year followup GERD and constipation. History of colonic adenoma. Ms. Burack has done very well on her current acid suppression and bowel regimen. Along the way, she was switched from omeprazole to ranitidine 150 mg once daily. But, again, reflux symptoms seem to be well controlled on her current regimen. Denies dysphagia, nausea or vomiting. Has not been hospitalized / no surgeries. No rectal bleeding. Appetite described as being good according to accompanying caregivers. She will be due for a surveillance colonoscopy January 2013.  Current Problems (verified): 1)  Skin Lesion  (ICD-709.9) 2)  Dyschromia, Unspecified  (ICD-709.00) 3)  Acute Bronchitis  (ICD-466.0) 4)  Unspecified Constipation  (ICD-564.00) 5)  Gerd  (ICD-530.81) 6)  Cerumen Impaction, Recurrent  (ICD-380.4) 7)  Seizure Disorder  (ICD-780.39) 8)  Mental Retardation  (ICD-319) 9)  Hx of Colonic Polyps, Adenomatous, Hx of  (ICD-V12.72) 10)  Congenital Hydrocephalus  (ICD-742.3) 11)  Constipation, Chronic  (ICD-564.09) 12)  Hyperlipidemia  (ICD-272.4) 13)  Hypertension  (ICD-401.9)  Current Medications (verified): 1)  Polyethylene Glycol 3350   Liqd (Polyethylene Glycol 3350) .... Swallow 17gm in 8oz H20 Twice Daily 2)  Simvastatin 10 Mg Tabs (Simvastatin) .... Take 1 Tab By Mouth At Bedtime 3)  Docusate Sodium 100 Mg  Caps (Docusate Sodium) .... Take 1 Tablet By Mouth Two Times A Day 4)  Bisoprolol-Hydrochlorothiazide 5-6.25 Mg  Tabs (Bisoprolol-Hydrochlorothiazide) .... Take 1 Tablet By Mouth Once A Day 5)  Klor-Con M10 10 Meq  Cr-Tabs (Potassium Chloride Crys Cr) .... Take 1 Tablet By Mouth Once A Day 6)  Phenobarbital 30 Mg  Tabs (Phenobarbital) .... Take 1 Tablet By Mouth Two Times A Day 7)  Ranitidine Hcl 150 Mg Tabs (Ranitidine Hcl) .... 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 However, the procedure was aborted because she coded.  Family History: Last updated: 2010/01/17 NO CHILDREN MOTHER DECEASED  DIABETIC AND HAD RENAL FAILURE FATHER  DECEASED  PROSTATE CANCER SHE DOES HAVE SIBLINGS WITH HISTORIES OF COLON CANCER, LYMPHOMA, AND HYPERTENSION Sister, colon cancer in age 7s  Social History: Last updated: 08/17/2007 DISABLED Single Never Smoked Alcohol use-no Drug use-no  Risk Factors: Smoking Status: never (05/18/2008)  Family History: NO CHILDREN MOTHER DECEASED  DIABETIC AND HAD RENAL FAILURE FATHER  DECEASED  PROSTATE CANCER SHE DOES HAVE SIBLINGS WITH HISTORIES OF COLON CANCER, LYMPHOMA, AND HYPERTENSION Sister, colon cancer in age 53s  Vital Signs:  Patient profile:   50 year old female Temp:     97.9 degrees F oral Pulse rate:   68 / minute BP sitting:   120 / 82  (left arm) Cuff size:   regular  Vitals Entered By: Hendricks Limes LPN (January 17, 2010 11:12 AM)  Physical Exam  General:  alert conversant obviously mentally and physically handicapped confined to a wheelchair. Accompanied biopsied caregivers Eyes:  no jaundice Abdomen:  abdomen obese soft nontender no obvious mass or organomegaly  Impression & Recommendations: Impression: Reflux symptoms well-controlled. Constipationdoing well on her  current  regimen.  History of colonic adenoma ; due for surveillance 2013.  Recommendations: continue polyethylene glycol p.r.n. constipation.  Ranitidine once daily is nearly homeopathic dose of acid suppressing medication. [place] for tachyphlaxisis. However, as long as she is doing wel,l we'll leave her on this regimen. However, should she develop any symptoms consistent with recurrent reflux, would have a low threshold, for getting her back on omeprazole 20 mg orally daily.  Appended Document: Orders Update    Clinical Lists Changes  Orders: Added new Service order of Est. Patient Level III (57846) - Signed

## 2010-02-21 NOTE — Letter (Signed)
Summary: Historic Patient File  Historic Patient File   Imported By: Lind Guest 10/16/2009 11:09:40  _____________________________________________________________________  External Attachment:    Type:   Image     Comment:   External Document

## 2010-03-24 ENCOUNTER — Telehealth: Payer: Self-pay | Admitting: Family Medicine

## 2010-04-01 NOTE — Progress Notes (Signed)
Summary: THROWING UP  Phone Note Call from Patient   Summary of Call: April Watson CALLED AND SAID THAT April Watson IS THROWING UP  AND WHAT CAN SHE DO TO HELP HER PLEASE CALL BACK AT [phone] Initial call taken by: Lind Guest,  March 24, 2010 3:17 PM  Follow-up for Phone Call        pls advise ensure she drinks small quantitiies odf fluid often, and advise on the BRAt diet , I have entered phenegan suppos historically only, but she needs this also Follow-up by: Syliva Overman MD,  March 24, 2010 3:48 PM  Additional Follow-up for Phone Call Additional follow up Details #1::        mattie aware, med sent Additional Follow-up by: Adella Hare LPN,  March 24, 2010 3:53 PM    New/Updated Medications: PROMETHAZINE HCL 12.5 MG SUPP (PROMETHAZINE HCL) one suppository every 6 to 8 hours as needed for vomitting Prescriptions: PROMETHAZINE HCL 12.5 MG SUPP (PROMETHAZINE HCL) one suppository every 6 to 8 hours as needed for vomitting  #20 x 0   Entered by:   Adella Hare LPN   Authorized by:   Syliva Overman MD   Signed by:   Adella Hare LPN on 45/40/9811   Method used:   Electronically to        [facility] (retail)       [address]       [place], [place]  [address]       Ph: [phone]       Fax: [phone]   RxID:   [id number] PROMETHAZINE HCL 12.5 MG SUPP (PROMETHAZINE HCL) one suppository every 6 to 8 hours as needed for vomitting  #20 x 0   Entered and Authorized by:   Syliva Overman MD   Signed by:   Syliva Overman MD on 03/24/2010   Method used:   Historical   RxID:   [id number]

## 2010-04-21 ENCOUNTER — Other Ambulatory Visit: Payer: Self-pay | Admitting: Family Medicine

## 2010-04-22 ENCOUNTER — Ambulatory Visit: Payer: Self-pay | Admitting: Family Medicine

## 2010-04-23 ENCOUNTER — Other Ambulatory Visit: Payer: Self-pay

## 2010-04-23 ENCOUNTER — Telehealth: Payer: Self-pay | Admitting: Family Medicine

## 2010-04-23 DIAGNOSIS — R569 Unspecified convulsions: Secondary | ICD-10-CM

## 2010-04-23 MED ORDER — PHENOBARBITAL 32.4 MG PO TABS: 32.4000 mg | ORAL_TABLET | Freq: Two times a day (BID) | ORAL | Status: DC

## 2010-04-23 NOTE — Telephone Encounter (Signed)
Med sent to [facility]  ?

## 2010-04-28 ENCOUNTER — Other Ambulatory Visit: Payer: Self-pay | Admitting: Family Medicine

## 2010-04-29 ENCOUNTER — Encounter: Payer: Self-pay | Admitting: Family Medicine

## 2010-05-01 ENCOUNTER — Encounter: Payer: Self-pay | Admitting: Family Medicine

## 2010-05-01 ENCOUNTER — Ambulatory Visit (INDEPENDENT_AMBULATORY_CARE_PROVIDER_SITE_OTHER): Payer: Medicare Other | Admitting: Family Medicine

## 2010-05-01 VITALS — BP 110/80 | HR 72 | Resp 16

## 2010-05-01 DIAGNOSIS — R7301 Impaired fasting glucose: Secondary | ICD-10-CM

## 2010-05-01 DIAGNOSIS — R5383 Other fatigue: Secondary | ICD-10-CM

## 2010-05-01 DIAGNOSIS — R569 Unspecified convulsions: Secondary | ICD-10-CM

## 2010-05-01 DIAGNOSIS — I10 Essential (primary) hypertension: Secondary | ICD-10-CM

## 2010-05-01 DIAGNOSIS — K219 Gastro-esophageal reflux disease without esophagitis: Secondary | ICD-10-CM

## 2010-05-01 DIAGNOSIS — R5381 Other malaise: Secondary | ICD-10-CM

## 2010-05-01 DIAGNOSIS — E785 Hyperlipidemia, unspecified: Secondary | ICD-10-CM

## 2010-05-01 DIAGNOSIS — L989 Disorder of the skin and subcutaneous tissue, unspecified: Secondary | ICD-10-CM

## 2010-05-01 MED ORDER — SIMVASTATIN 10 MG PO TABS: 10.0000 mg | ORAL_TABLET | Freq: Every day | ORAL | Status: DC

## 2010-05-01 MED ORDER — RANITIDINE HCL 150 MG PO TABS: 150.0000 mg | ORAL_TABLET | Freq: Every day | ORAL | Status: DC

## 2010-05-01 MED ORDER — POTASSIUM CHLORIDE CRYS ER 10 MEQ PO TBCR: 10.0000 meq | EXTENDED_RELEASE_TABLET | Freq: Every day | ORAL | Status: DC

## 2010-05-01 MED ORDER — PHENOBARBITAL 32.4 MG PO TABS: 32.4000 mg | ORAL_TABLET | Freq: Two times a day (BID) | ORAL | Status: DC

## 2010-05-01 MED ORDER — BISOPROLOL-HYDROCHLOROTHIAZIDE 5-6.25 MG PO TABS: 1.0000 | ORAL_TABLET | Freq: Every day | ORAL | Status: DC

## 2010-05-01 NOTE — Patient Instructions (Signed)
F/U in 6 months.  I am referring you to the dermatologist about your left foot  You need to have fasting labs in the next 1 to 2 weeks

## 2010-05-08 ENCOUNTER — Encounter: Payer: Self-pay | Admitting: Family Medicine

## 2010-05-16 LAB — BASIC METABOLIC PANEL
CO2: 26 mEq/L (ref 19–32)
Chloride: 94 mEq/L — ABNORMAL LOW (ref 96–112)
Potassium: 4.4 mEq/L (ref 3.5–5.3)
Sodium: 137 mEq/L (ref 135–145)

## 2010-05-16 LAB — CBC WITH DIFFERENTIAL/PLATELET
Eosinophils Absolute: 0.1 10*3/uL (ref 0.0–0.7)
HCT: 45.1 % (ref 36.0–46.0)
Hemoglobin: 15 g/dL (ref 12.0–15.0)
Lymphs Abs: 2.4 10*3/uL (ref 0.7–4.0)
MCH: 30.4 pg (ref 26.0–34.0)
MCHC: 33.3 g/dL (ref 30.0–36.0)
MCV: 91.3 fL (ref 78.0–100.0)
Monocytes Absolute: 0.6 10*3/uL (ref 0.1–1.0)
Monocytes Relative: 7 % (ref 3–12)
Neutrophils Relative %: 60 % (ref 43–77)
RBC: 4.94 MIL/uL (ref 3.87–5.11)

## 2010-05-16 LAB — HEPATIC FUNCTION PANEL
AST: 16 U/L (ref 0–37)
Albumin: 4.5 g/dL (ref 3.5–5.2)
Alkaline Phosphatase: 87 U/L (ref 39–117)
Total Protein: 7.3 g/dL (ref 6.0–8.3)

## 2010-05-16 LAB — LIPID PANEL
HDL: 38 mg/dL — ABNORMAL LOW (ref >39)
LDL Cholesterol: 103 mg/dL — ABNORMAL HIGH (ref 0–99)
Total CHOL/HDL Ratio: 4.2 Ratio
Triglycerides: 101 mg/dL (ref <150)

## 2010-05-16 LAB — HEMOGLOBIN A1C: Hgb A1c MFr Bld: 7.2 % — ABNORMAL HIGH (ref <5.7)

## 2010-05-16 LAB — TSH: TSH: 2.018 u[IU]/mL (ref 0.350–4.500)

## 2010-05-16 MED ORDER — METFORMIN HCL 500 MG PO TABS: 500.0000 mg | ORAL_TABLET | Freq: Two times a day (BID) | ORAL | Status: DC

## 2010-05-19 ENCOUNTER — Encounter: Payer: Self-pay | Admitting: Family Medicine

## 2010-05-19 ENCOUNTER — Other Ambulatory Visit (INDEPENDENT_AMBULATORY_CARE_PROVIDER_SITE_OTHER): Payer: Medicare Other

## 2010-05-19 DIAGNOSIS — E119 Type 2 diabetes mellitus without complications: Secondary | ICD-10-CM

## 2010-05-19 MED ORDER — GLUCOSE BLOOD VI STRP: ORAL_STRIP | Status: DC

## 2010-05-19 MED ORDER — ACCU-CHEK MULTICLIX LANCETS MISC: Status: DC

## 2010-05-19 NOTE — Assessment & Plan Note (Signed)
Controlled, no change in medication  

## 2010-05-19 NOTE — Assessment & Plan Note (Signed)
Low fat diet stressed, continue current med unless lab shows worsened control

## 2010-05-19 NOTE — Progress Notes (Signed)
  Subjective:    Patient ID: April Watson, female    DOB: [birth year], 50 y.o.   MRN: [id number]  HPI Pt in for follow up up of chronic conditions. She is accompanied by 2 siblings , whose only concern is regarding a dark area on her right foot which may be enlarging. There is no h/o known trauma , no bleeding or drainage from the area. Her apetite is reportedly fair, but both siblings are concerned that her consumption of sweets is excessive. I respectfully remind them that due to her limitations from cerebral palsy, she is only able to eat what is given. They both state the desire to improve her diet.    Review of Systems History is provided by family, pt has expressive aphasia Denies recent fever or chills. Denies sinus pressure, nasal congestion, ear pain or sore throat. Denies chest congestion, productive cough or wheezing. Denies  paroxysmal nocturnal dyspnea, orthopnea and leg swelling Denies abdominal pain, nausea, vomiting,diarrhea or constipation.  Denies rectal bleeding or change in bowel movement. Denies dysuria,or malodorus  urine. Denies joint pain, swelling and limitation in mobility. Denies headaches, seizure, numbness, or tingling. Denies depression, anxiety or insomnia.        Objective:   Physical Exam Patient alert  and in no Cardiopulmonary distress.Pt is chronically wheelchair bound  HEENT :Macrocephaly.No facial asymmetry, EOMI, no sinus tenderness, TM's clear, Oropharynx pink and moist.  Neck decreased ROM no adenopathy.  Chest: Clear to auscultation bilaterally.Decreased in bases  CVS: S1, S2 no murmurs, no S3.  ABD: Soft non tender. Bowel sounds normal.  Ext: No edema  MS: decreased  ROM spine, shoulders, hips and knees.  Skin: Intact, hyperpigmented macular lesion on dorsum of foot  Psych: Good eye contact, normal affect. not anxious or depressed appearing.  CNS: CN 2-12 intact        Assessment & Plan:

## 2010-05-19 NOTE — Assessment & Plan Note (Signed)
Stable and controlled on current meds no change at this time

## 2010-05-19 NOTE — Assessment & Plan Note (Signed)
Increased in size with irregularity in pigmentation ,referral will be made for eval

## 2010-05-29 ENCOUNTER — Other Ambulatory Visit: Payer: Self-pay | Admitting: Family Medicine

## 2010-06-03 NOTE — Assessment & Plan Note (Signed)
NAMEADRAIN, BUTRICK                  CHART#:  [id number]   DATE:  11/19/2006                       DOB:  11/04/1960   HISTORY:  History of gastroesophageal reflux disease, obstipation and  constipation, a distant history of colonic polyps, last seen February 08, 2006, at which time we did a complete colonoscopy given her history of  polyps.  It was a difficult prep.  She had a long, redundant colon.  She  had some erosions in her proximal rectum and sigmoid colon.  Biopsies  revealed mild ischemic colitis, which is likely pressure effect from  obstipation.  She has been on Colace 100 mg b.i.d. and MiraLax 17 gm  orally nightly, and she is really moving her bowels once daily and is  doing well.  Has a history of gastroesophageal reflux disease taking  Reglan 10 mg twice daily and Omeprazole 20 mg orally daily.  Apparently  no side effects with Reglan, but the family members really think that  the Reglan is not adding much particularly since her bowel movements  have improved tremendously.   CURRENT MEDICATION REGIMEN:  See list.   ALLERGIES:  No known drug allergies.   PHYSICAL EXAMINATION:  GENERAL:  Ms. Vandenboom is in the wheelchair  accompanied by her family members.  VITAL SIGNS:  Temp 97.4, BP 122/82, pulse 62.  EXTREMITIES:  She has marked contractures of her extremities.  ABDOMEN:  Flat, soft, and nontender without appreciable mass or  organomegaly.   ASSESSMENT:  Gastroesophageal reflux disease symptoms well-controlled on  Omeprazole, and she also takes Reglan.  No apparent side effects, but I  have recommended that Ms. Breidenbach stop taking Reglan and if they can tell  no difference in how she is doing, would leave that medicine off for  good and continue Omeprazole 20 mg orally daily indefinitely.  Her  constipation and obstipation are much better, some changes of ischemic  colitis recent biopsies not surprising; however, I suspect she would do  just fine on a regimen of stool  softeners and daily MiraLax.  That is  just what I have recommended they continue with at this point in time.  She is slated to have a repeat colonoscopy in 2013 given her prior  history of polyps, and we will plan to see this nice patient and family  back in one year.  Again, if she seems not to do as well off of  Reglan, I have told her family to get her right back on at 10 mg orally  b.i.d., but there is a risk of tardive dyskinesia and dystonia with this  agent.       Jonathon Bellows, M.D.  Electronically Signed     RMR/MEDQ  D:  11/19/2006  T:  11/19/2006  Job:  [id number]   cc:   Milus Mallick. Lodema Hong, M.D.

## 2010-06-03 NOTE — Assessment & Plan Note (Signed)
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  The patient has done  well.  She is well cared for by family members as always.  She is  reporting that she is moving her bowels every day.  Reflux symptoms are  well controlled on omeprazole 20 mg orally daily.  She is not taking any  Reglan at this time nor she take any stool softeners.  She is doing very  well.  She is slated to have a surveillance colonoscopy in January 2013.  She is keeping up with her appointments with Dr. Lodema Hong and apparently  is not having any difficulties otherwise.   CURRENT MEDICATIONS:  See updated list.   ALLERGIES:  No known drug allergies.   PHYSICAL EXAMINATION:  GENERAL:  She is accompanied by family members  and is seen in the wheelchair.  VITAL SIGNS:  Temperature 97.9, BP 120/78, and pulse 72.  SKIN:  Warm and dry.  LUNGS:  Clear to auscultation.  CARDIAC:  Regular rate and rhythm without murmur, gallop, or rub.  ABDOMEN:  Obese, soft, but obvious nontender to palpation.  No obvious  mass or organomegaly.   ASSESSMENT:  1. Gastroesophageal reflux disease, quiescent on omeprazole.  She is      to continue that regimen.  2. History of obstipation, constipation well managed with MiraLax 17 g      orally at bedtime.  3. Personal history of colonic adenoma, due for surveillance in 2013.   Unless something comes up, plan to touch base with the patient to see  how she is doing in 1 year.       Jonathon Bellows, M.D.  Electronically Signed     RMR/MEDQ  D:  11/18/2007  T:  11/19/2007  Job:  [id number]   cc:   Milus Mallick. Lodema Hong, M.D.

## 2010-06-06 NOTE — Op Note (Signed)
NAME:  April, Watson                           ACCOUNT NO.:  [id number]   MEDICAL RECORD NO.:  [id number]                   PATIENT TYPE:  AMB   LOCATION:  DAY                                  FACILITY:  [facility]   PHYSICIAN:  R. Roetta Sessions, M.D.              DATE OF BIRTH:  1960-03-15   DATE OF PROCEDURE:  DATE OF DISCHARGE:                                 OPERATIVE REPORT   PROCEDURE:  Incomplete colonoscopy with rectal snare polypectomy.   ENDOSCOPIST:  Gerrit Friends. Rourk, M.D.   INDICATIONS FOR PROCEDURE:  The patient is a 50 year old lady who presented  to me with severe obstipation.  She had 2 rounds of GoLYTELY last week plus  enemas.  We attempted the colonoscopy on April 27, 2003; however, the prep  was still poor. We found a good size 2 cm sessile polyp in her rectum which  was not manipulated.  She was reprepped over the weekend.  We gave her  Zelnorm for 3 days.  She now comes for colonoscopy.  This approach has been  discussed with the patient's family at length.  The potential risks,  benefits, and alternatives have been reviewed.  Please see the documentation  in the medical record.   PROCEDURE NOTE:  O2 saturation, blood pressure, pulse and respirations were  monitored throughout the entirety of the procedure.  Conscious sedation: IV  Versed in incremental doses.   INSTRUMENT:  Olympus CF-140 colonoscope.   FINDINGS:  A digital rectal exam revealed some liquid stool.   ENDOSCOPIC FINDINGS:  The prep was marginal; however, a colonoscopy was  doable and was much better than previously seen 3 days' earlier.   RECTUM:  Examination of the rectal mucosa including the retroflex view of  the anal verge again revealed an irregular somewhat ulcerated, 2 cm polypoid  lesion at 15 cm in from the anal verge.  Please see photos.  The remainder  of the rectal mucosa appeared normal.   COLON:  The colonic mucosa was surveyed from the rectosigmoid junction well  into what, I feel,  is the right colon; however, the colon was quite  elongated, capacious, and there was viscous mucus stool which kept  repeatedly clogging the scope. I was able to wash and suction this material  out.  However, do to the redundancy of the colon I was unable to make it to  the cecum in spite of changing of the patient's position and various  applications of external abdominal pressure.  No other lesions were seen.   The scope was pulled back to the rectum and this 2 cm polyp was removed,  totally, in piece meal fashion.  There was good hemostasis.  No apparent  complications.  Multiple fragments were recovered.   The patient tolerated the procedure well and was reacted in endoscopy.   IMPRESSION:  1. A large rectal polyp removed with snare cautery in  piecemeal fashion,     otherwise normal rectum.  2. Long capacious colon incompletely seen as described above.  No colonic     mucosal abnormalities observed.   RECOMMENDATIONS:  1. No aspirin or arthritis medications for 10 days.  2. Colace 100 mg orally b.i.d.  3. Was to continue Zelnorm 6 mg twice daily.  4. MiraLax 8 ounces at bedtime.  5. Air contrast barium enema in 2-3 weeks to evaluate the right colon not     seen adequately today.  6. Follow up appointment with [place] in 3 months.      ___________________________________________                                            Jonathon Bellows, M.D.   RMR/MEDQ  D:  04/30/2003  T:  04/30/2003  Job:  [id number]   cc:   Annia Friendly. Loleta Chance, M.D.  [address]  [place]  [place] [address]  Fax: [phone]   R. Roetta Sessions, M.D.  [address]  [place]  [place] [address]  Fax: [phone]

## 2010-06-06 NOTE — Op Note (Signed)
NAME:  April Watson, April Watson                           ACCOUNT NO.:  [id number]   MEDICAL RECORD NO.:  [id number]                   PATIENT TYPE:  AMB   LOCATION:  DAY                                  FACILITY:  [facility]   PHYSICIAN:  R. Roetta Sessions, M.D.              DATE OF BIRTH:  [birth year]   DATE OF PROCEDURE:  08/14/2003  DATE OF DISCHARGE:                                 OPERATIVE REPORT   PROCEDURE PERFORMED:  Flexible sigmoidoscopy.   INDICATIONS FOR PROCEDURE:  The patient is a 50 year old lady with chronic  constipation who underwent attempted colonoscopy back in April and had a 2  cm rectal adenoma at 15 cm which was removed in piecemeal fashion.  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  Because of the piecemeal  nature of the removal, she is being brought back to check the site.  It is  notable I was unable to reach the cecum previously.  She was sent for barium  enema.  The radiologist was unable to reach the cecum.  The right colon has  not been imaged.  Sigmoidoscopy is now being done to look at the polypectomy  site.  This approach has been discussed with caregivers.  Potential risks,  benefits and alternatives have been reviewed and questions answered.  Please  see documentation in the medical record.   DESCRIPTION OF PROCEDURE:  Oxygen saturations, blood pressure, pulse and  respirations were monitored throughout the entire procedure.  Conscious  sedation Versed 2 mg IV, Demerol 50 mg IV in divided doses.  Instrument  Olympus video scope system.   FINDINGS:  Digital rectal exam revealed stool on examiner's finger.  Endoscopically prep was poor.  Liquid stool in the rectum.  Examination of  rectal mucosa.  Examination of the sigmoid all the way up to 50 cm was  undertaken.  The rectal mucosa appeared normal as did the sigmoid mucosa to  50 cm.  The mucosa around the site of prior polypectomy appeared entirely  normal.  The patient tolerated the procedure  well, reacted in endoscopy.   IMPRESSION:  Normal-appearing rectum, normal-appearing sigmoid colon to 50  cm.  Relatively poor prep made exam more difficult.   RECOMMENDATIONS:  Will attempt a surveillance colonoscopy in three years.  However, in the near future, will attempt to get her a virtual colonoscopy  to look at her right colon which has not yet been seen.  Further  recommendations to follow.      ___________________________________________                                            Jonathon Bellows, M.D.   RMR/MEDQ  D:  08/14/2003  T:  08/14/2003  Job:  [id number]   cc:   Annia Friendly.  Loleta Chance, M.D.  [address]  [place]  [place] [address]  Fax: [phone]

## 2010-06-06 NOTE — Op Note (Signed)
NAME:  April Watson, April Watson                           ACCOUNT NO.:  [id number]   MEDICAL RECORD NO.:  [id number]                   PATIENT TYPE:  AMB   LOCATION:  DAY                                  FACILITY:  [facility]   PHYSICIAN:  R. Roetta Sessions, M.D.              DATE OF BIRTH:  1960-10-24   DATE OF PROCEDURE:  DATE OF DISCHARGE:                                 OPERATIVE REPORT   PROCEDURE:  Attempted colonoscopy.   ENDOSCOPIST:  Gerrit Friends. Rourk, M.D.   INDICATIONS FOR PROCEDURE:  The patient is a 50 year old lady with advanced  mental retardation who presented with spurious diarrhea.  She was found to  have severe obstipation/constipation.  She has been given 2 rounds of  GoLYTELY earlier this week and actually took a third round in preparation  for colonoscopy today because she was Hemoccult-positive.  This morning a  digital examination by me revealed formed stool and lots of liquid stool in  the rectal vault.  Subsequently she was given tap water enemas until clear.  This afternoon a digital rectal examination revealed minimal stool residue  on the examiner's finger.   ENDOSCOPIC FINDINGS:  There was a large amount of liquid stool in the rectum  and at least a 2 cm sessile polyp, in the rectum, at 15 cm.  At the  rectosigmoid there was a large formed stool that was occluding the lumen  (please see photos).  This precluded attempts at completing the exam and  certainly precluded attempts at a major snare polypectomy with cautery.   The procedure was terminated, the patient was not given any conscious  sedation.   ASSESSMENT:  Severe obstipation/constipation with an extensive colon prep  earlier this week still not adequate for complete colonoscopy and snare  polypectomy.   RECOMMENDATIONS:  I have discussed this situation, at length, with the  patient's family.  She has been able to take p.o. without any problems.  I  have recommended that we keep her on clear liquids  over-the-weekend and have  her go to the office this afternoon to start her on Zelnorm 6 mg orally  b.i.d. over the next 3 days.  Reprep with an additional 4 liters of GoLYTELY  Sunday afternoon and bring her back here Monday morning, April 30, 2003 for  enemas and another attempt at full colonoscopy with polypectomy.  The family  members understand this approach and are agreeable.  Will plan to continue  prep as an outpatient and bring her back for a colonoscopy on April 30, 2003.  Will also go ahead and take a serum calcium and TSH today.  Further  recommendations to follow.      ___________________________________________  Jonathon Bellows, M.D.   RMR/MEDQ  D:  04/27/2003  T:  04/28/2003  Job:  [id number]   cc:   Annia Friendly. Loleta Chance, M.D.  [address]  [place]  [place] [address]  Fax: [phone]   R. Roetta Sessions, M.D.  [address]  [place]  [place] [address]  Fax: [phone]

## 2010-06-06 NOTE — H&P (Signed)
NAME:  April Watson, April Watson                           ACCOUNT NO.:  [id number]   MEDICAL RECORD NO.:  [id number]                   PATIENT TYPE:  OUT   LOCATION:  RAD                                  FACILITY:  [facility]   PHYSICIAN:  R. Roetta Sessions, M.D.              DATE OF BIRTH:  [birth year]   DATE OF ADMISSION:  05/14/2003  DATE OF DISCHARGE:  05/14/2003                                HISTORY & PHYSICAL   PROCEDURE:  Flexible sigmoidoscopy.   HISTORY OF PRESENT ILLNESS:  April Watson is a 50 year old African-American  female with advanced mental retardation, who was found to have an irregular,  somewhat ulcerated 2 cm polypoid lesion at 15 cm in from the anal verge.  The polyp was removed totally in piecemeal fashion by April Watson on  colonoscopy on April 30, 2003.  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  There was no invasive  tumor identified.  Due to the redundancy of the colon, April Watson was unable  to make it to the cecum in spite of position changes and external pressure.  Therefore, she was scheduled for barium enema.  Barium enema revealed a  dilated atonic colon from rectum to mid transverse colon.  There was no sign  of obstruction or mass seen at that point.  As far as her obstipation is  concerned, she has had regular bowel movements on a daily basis which are  soft and brown since the initiation of Zelnorm 6 mg b.i.d. along with Colace  100 mg b.i.d. and Miralax 17 g daily.  Her sister accompanies with her today  with whom she lives, and she denies any rectal bleeding or melena.  She  denies any abdominal pain or proctalgia.  She notes that her appetite is  good, and she is continuing with her normal activity level.  She denies any  fever or chills.  Ionized calcium was low back in April at 1.04, and family  is unsure whether this was treated.   PAST MEDICAL HISTORY:  1. Hypercholesterolemia.  2. GERD.  3. Hypertension.  4. Constipation.  5. Obstipation.  6. Adenomatous rectal polyp as described in HPI with the last colonoscopy by     April Watson for November 2005.   PAST SURGICAL HISTORY:  Denies any.   CURRENT MEDICATIONS:  1. Zocor 10 mg daily.  2. Phenobarbital 30 mg t.i.d.  3. Mellaril 25 mg t.i.d.  4. Reglan 10 mg t.i.d.  5. Protonix 40 mg daily.  6. K-Dur 10 mEq daily.  7. Ziac 5/6.25 mg daily.  8. Zelnorm 6 mg b.i.d.  9. Colace 100 mg b.i.d.  10.      Miralax 17 g q.h.s.   ALLERGIES:  No known drug allergies.   FAMILY HISTORY:  Mother had rheumatic fever and history of colonic polyps.  Father died with prostate cancer.  No history of chronic  GI or liver  disease, otherwise.   SOCIAL HISTORY:  April Watson is single and disabled.  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  She currently  lives with her sister.  She denies any tobacco, alcohol, or drug use.   REVIEW OF SYSTEMS:  See HPI.   PHYSICAL EXAMINATION:  VITAL SIGNS:  Temp 98.1, blood pressure 120/76, pulse  65.  GENERAL:  April Watson is a 50 year old, wheelchair-bound, African-American  female who is alert and pleasant.  HEENT:  Hydrocephalic.  Sclerae clear, nonicteric, conjunctivae pink.  Oropharynx pink and moist without any lesions.  CHEST/HEART:  Regular rate and rhythm with normal S1, S2, without any  murmurs, rubs, thrills, or gallops.  ABDOMEN:  Positive bowel sounds.  Slightly distended.  Nontender.  Without  any mass or hepatosplenomegaly.  No rebound tenderness or guarding.   ASSESSMENT:  April Watson is a 50 year old African-American female with  significant mental retardation who will need follow up on adenomatous rectal  polyp, as described in HPI.  We will schedule her for a flexible  sigmoidoscopy.  As far as her severe obstipation/constipation is concerned,  she is responding well to Zelnorm stool softener and Miralax.  We will  continue this regimen.   RECOMMENDATIONS:  1. I  do not see where TSH has been obtained, and we will check this.  Also,     we will follow up on metabolic panel and check calcium to see if she     still has hypocalcemia.  2. We will schedule flexible sigmoidoscopy with April Watson in the near     future.  She may need some IV sedation during the procedure as well, and     I will discuss this further with April Watson.  I have discussed this     procedure including risks and benefits with April Watson's sister and     brother today as well.  3. Further recommendations pending procedure.  4. Continue current medications.     _____________________________________  ___________________________________________  April Watson, N.P.               April Watson, M.D.   KC/MEDQ  D:  08/06/2003  T:  08/06/2003  Job:  [id number]   cc:   April Watson, M.D.  [address]  [place]  [place] [address]  Fax: [phone]

## 2010-06-06 NOTE — Op Note (Signed)
NAME:  April Watson, April Watson                 ACCOUNT NO.:  [id number]   MEDICAL RECORD NO.:  [id number]          PATIENT TYPE:  AMB   LOCATION:  DAY                           FACILITY:  [facility]   PHYSICIAN:  R. Roetta Sessions, M.D. DATE OF BIRTH:  1960-02-06   DATE OF PROCEDURE:  02/08/2006  DATE OF DISCHARGE:                               OPERATIVE REPORT   PROCEDURE:  Colonoscopy surveillance.   INDICATIONS FOR PROCEDURE:  The patient is a 50 year old African-  American female with history of colonic polyps.  She has had a difficult  time with a prep previously.  She comes here in the setting of a  somewhat prolonged prep for surveillance colonoscopy.  This approach has  discussed with the patient at length.  Potential risks, benefits and  alternatives have been reviewed, questions answered, she is agreeable.  Please see documentation in the medical record.   PROCEDURE NOTE:  O2 saturation, blood pressure, pulse and respirations  were monitored throughout the entire procedure.   CONSCIOUS SEDATION:  Versed 2 mg IV, Demerol 50 mg IV in a single dose.   INSTRUMENT:  Pentax video chip system.   FINDINGS:  Digital rectal exam revealed mild anal stenosis.   ENDOSCOPIC FINDINGS:  Prep was marginal with pools of liquid stool  throughout colon which had to be suctioned and washed.  Examination of  colonic mucosa was undertaken.  The scope was advanced from the  rectosigmoid junction through the left, transverse and right colon to  area of the appendiceal orifice, ileocecal valve, and cecum.  These  structures were well seen and photographed for the record.  From this  level scope was slowly withdrawn.  All previously mentioned mucosal  surfaces were again seen.  The scope was pulled down to the rectum where  thorough examination of rectal mucosa including retroflex view of the  anal verge was undertaken.  The patient had a long redundant baggy  colon.  The mucosa appeared entirely normal and  except for in the  proximal rectum and distal sigmoid there were numerous 1-2 mm small  erosions present.  Please see photos.  This was not at all consistent  with enema tip trauma.  These areas were biopsied.  Otherwise the rectal  and colonic mucosa appeared normal.  The patient tolerated the procedure  very well and was reacted in endoscopy.   IMPRESSION:  Mild anal stenosis, multiple erosions of the proximal  rectum and sigmoid as described above, biopsied.  Remainder of rectal  and colonic mucosa appeared normal.  The patient had a long redundant  colon.   RECOMMENDATIONS:  1. Follow-up on path.  2. Further recommendations to follow.   ADDENDUM:  This patient is chronically constipated.  There has been no  diarrhea or hematochezia.      Jonathon Bellows, M.D.  Electronically Signed     RMR/MEDQ  D:  02/08/2006  T:  02/08/2006  Job:  [id number]   cc:   Annia Friendly. Loleta Chance, MD  Fax: [phone]

## 2010-06-20 ENCOUNTER — Other Ambulatory Visit: Payer: Self-pay | Admitting: Family Medicine

## 2010-07-16 ENCOUNTER — Telehealth: Payer: Self-pay | Admitting: *Deleted

## 2010-07-16 DIAGNOSIS — E785 Hyperlipidemia, unspecified: Secondary | ICD-10-CM

## 2010-07-16 DIAGNOSIS — I10 Essential (primary) hypertension: Secondary | ICD-10-CM

## 2010-07-16 NOTE — Telephone Encounter (Signed)
Orders only

## 2010-08-23 LAB — BASIC METABOLIC PANEL
CO2: 29 mEq/L (ref 19–32)
Glucose, Bld: 86 mg/dL (ref 70–99)
Potassium: 4.1 mEq/L (ref 3.5–5.3)
Sodium: 133 mEq/L — ABNORMAL LOW (ref 135–145)

## 2010-09-15 ENCOUNTER — Other Ambulatory Visit: Payer: Self-pay | Admitting: Family Medicine

## 2010-10-15 ENCOUNTER — Other Ambulatory Visit: Payer: Self-pay | Admitting: Family Medicine

## 2010-10-24 ENCOUNTER — Ambulatory Visit (INDEPENDENT_AMBULATORY_CARE_PROVIDER_SITE_OTHER): Payer: Medicare Other

## 2010-10-24 DIAGNOSIS — Z23 Encounter for immunization: Secondary | ICD-10-CM

## 2010-10-27 ENCOUNTER — Other Ambulatory Visit: Payer: Self-pay | Admitting: Family Medicine

## 2010-11-05 ENCOUNTER — Ambulatory Visit: Payer: Medicare Other | Admitting: Family Medicine

## 2010-11-07 ENCOUNTER — Encounter: Payer: Self-pay | Admitting: Internal Medicine

## 2010-11-18 ENCOUNTER — Other Ambulatory Visit: Payer: Self-pay | Admitting: Family Medicine

## 2010-11-26 ENCOUNTER — Other Ambulatory Visit: Payer: Self-pay | Admitting: Family Medicine

## 2010-12-08 ENCOUNTER — Encounter: Payer: Self-pay | Admitting: Family Medicine

## 2010-12-17 ENCOUNTER — Encounter: Payer: Self-pay | Admitting: Internal Medicine

## 2010-12-17 ENCOUNTER — Ambulatory Visit (INDEPENDENT_AMBULATORY_CARE_PROVIDER_SITE_OTHER): Payer: Medicare Other | Admitting: Family Medicine

## 2010-12-17 ENCOUNTER — Other Ambulatory Visit: Payer: Self-pay

## 2010-12-17 VITALS — BP 122/66 | HR 72 | Resp 18

## 2010-12-17 DIAGNOSIS — I10 Essential (primary) hypertension: Secondary | ICD-10-CM

## 2010-12-17 DIAGNOSIS — K5909 Other constipation: Secondary | ICD-10-CM

## 2010-12-17 DIAGNOSIS — E119 Type 2 diabetes mellitus without complications: Secondary | ICD-10-CM

## 2010-12-17 DIAGNOSIS — E785 Hyperlipidemia, unspecified: Secondary | ICD-10-CM

## 2010-12-17 DIAGNOSIS — R5381 Other malaise: Secondary | ICD-10-CM

## 2010-12-17 DIAGNOSIS — R7301 Impaired fasting glucose: Secondary | ICD-10-CM

## 2010-12-17 DIAGNOSIS — K219 Gastro-esophageal reflux disease without esophagitis: Secondary | ICD-10-CM

## 2010-12-17 LAB — HEMOGLOBIN A1C: Mean Plasma Glucose: 128 mg/dL — ABNORMAL HIGH (ref <117)

## 2010-12-17 MED ORDER — GLUCOSE BLOOD VI STRP: ORAL_STRIP | Status: DC

## 2010-12-17 MED ORDER — METFORMIN HCL 500 MG PO TABS: ORAL_TABLET | ORAL | Status: DC

## 2010-12-17 MED ORDER — ACCU-CHEK MULTICLIX LANCETS MISC: Status: DC

## 2010-12-17 NOTE — Progress Notes (Signed)
  Subjective:    Patient ID: April Watson, female    DOB: 30-Jul-1960, 50 y.o.   MRN: [id number]  HPI  The PT is here for follow up and re-evaluation of chronic medical conditions, medication management and review of any available recent lab and radiology data.  Preventive health is updated, specifically  Cancer screening and Immunization.   Questions or concerns regarding consultations or procedures which the PT has had in the interim are  addressed. The PT denies any adverse reactions to current medications since the last visit.  There are no new concerns.  There are no specific complaints      Review of Systems See HPI Denies recent fever or chills. Denies sinus pressure, nasal congestion, ear pain or sore throat. Denies chest congestion, productive cough or wheezing. Denies chest pains, palpitations and leg swelling Denies abdominal pain, nausea, vomiting,diarrhea or constipation.   Denies dysuria, frequency, hesitancy or incontinence. Chronic joint pain and reduced mobility. Denies headaches, seizures, numbness, or tingling. Denies depression, anxiety or insomnia. Denies skin break down or rash.        Objective:   Physical Exam Patient alert and oriented and in no cardiopulmonary distress.  HEENT: No facial asymmetry, EOMI, no sinus tenderness,  oropharynx pink and moist.  Neck supple no adenopathy.Nacrocephaly, mental retardation  Chest: Clear to auscultation bilaterally.  CVS: S1, S2 no murmurs, no S3.  ABD: Soft non tender. Bowel sounds normal.  Ext: No edema  WU:[id number] ROM spine , and reduced power in lower extremities  Skin: Intact, no ulcerations or rash noted.  Psych: Good eye contact, normal affect.  not anxious or depressed appearing.  CNS: CN 2-12 intact, power, tone and sensation normal throughout.        Assessment & Plan:

## 2010-12-17 NOTE — Patient Instructions (Addendum)
F/U April 30 or after.  You are doing very well, no changes in medication.  HBA1C and chem 7 today,  Cbc , fasting cmp and EGFR, lipid , TSH and HBA1C April 26 or after.  Needs microalbumin asap, pls give family a container

## 2010-12-18 ENCOUNTER — Ambulatory Visit (INDEPENDENT_AMBULATORY_CARE_PROVIDER_SITE_OTHER): Payer: Medicare Other | Admitting: Gastroenterology

## 2010-12-18 ENCOUNTER — Encounter: Payer: Self-pay | Admitting: Gastroenterology

## 2010-12-18 VITALS — BP 135/83 | HR 73 | Temp 97.3°F

## 2010-12-18 DIAGNOSIS — Z86010 Personal history of colonic polyps: Secondary | ICD-10-CM

## 2010-12-18 DIAGNOSIS — K219 Gastro-esophageal reflux disease without esophagitis: Secondary | ICD-10-CM

## 2010-12-18 DIAGNOSIS — K5909 Other constipation: Secondary | ICD-10-CM

## 2010-12-18 LAB — BASIC METABOLIC PANEL
CO2: 29 mEq/L (ref 19–32)
Chloride: 94 mEq/L — ABNORMAL LOW (ref 96–112)
Sodium: 136 mEq/L (ref 135–145)

## 2010-12-18 NOTE — Progress Notes (Signed)
Primary Care Physician:  Syliva Overman, MD, MD  Primary Gastroenterologist:  Roetta Sessions, MD   Chief Complaint  Patient presents with  . Follow-up    HPI:  April Watson is a 50 y.o. female here with her 2 sisters today. She is here to followup chronic constipation, GERD. She also has a history of colon polyps, in 2005 had one with high-grade dysplasia. She is due for a surveillance colonoscopy in January 2013. In 2008, she had a prolonged bowel prep because of history of very poor bowel prep and incomplete colonoscopies. In 2008, she consumed a clear liquid diet for 4 days. She had GoLYTELY and 2 enemas. According to her sisters, patient has been doing fairly well at home. There's been no concerns for her heartburn, difficulty swallowing, vomiting, abdominal pain. She has a bowel movement almost every day on Colace and MiraLax. No melena or rectal bleeding. They are requesting to postpone her surveillance colonoscopy for a few months in order to coordinate family help. Their brother is recovering from surgery.  Current Outpatient Prescriptions  Medication Sig Dispense Refill  . DOC-Q-LACE 100 MG capsule TAKE 1 CAPSULE TWICE     DAILY FOR CONSTIPATION.  60 each  4  . glucose blood (ACCU-CHEK AVIVA) test strip Use as instructed once daily  DX 250.00  100 each  12  . Lancets (ACCU-CHEK MULTICLIX) lancets Use as directed once daily DX 250.00  100 each  12  . metFORMIN (GLUCOPHAGE) 500 MG tablet TAKE ONE TABLET ORALLY   TWICE DAILY WITH MEALS.  60 tablet  4  . MIRALAX powder MIX 1 CAPFUL (17 G) IN 8 OUNCES OF WATER/JUICE ANDDRINK TWICE A DAY FOR    CONSTIPATION.  850 g  3  . PHENobarbital (LUMINAL) 32.4 MG tablet TAKE 1 TABLET BY MOUTH   TWO TIMES DAILY FORSEIZURE DISORDER.  60 tablet  3  . potassium chloride (K-DUR,KLOR-CON) 10 MEQ tablet TAKE (1) TABLET BY MOUTH ONCE DAILY WITH FOOD.  30 tablet  2  . promethazine (PHENERGAN) 12.5 MG suppository Place 12.5 mg rectally every 6 (six) hours as  needed. One suppository every 6 to 8 hours as needed for vomiting       . ZANTAC 150 MG tablet TAKE 1 TABLET BY MOUTH ONCE A DAY.  30 each  4  . ZIAC 5-6.25 MG per tablet TAKE (1) TABLET BY MOUTH ONCE DAILY FOR BLOOD PRESSURE.  30 each  2  . ZOCOR 10 MG tablet TAKE (1) TABLET BY MOUTH AT BEDTIME FOR CHOLESTEROL.  30 each  2    Allergies as of 12/18/2010  . (No Known Allergies)    Past Medical History  Diagnosis Date  . Seizure disorder   . Mental retardation   . 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 Congenital hydrocephalus   . Chronic constipation   . Hyperlipidemia   . Hypertension   . GERD (gastroesophageal reflux disease)     Past Surgical History  Procedure Date  . Patient reports that they had brain surgery attempted for hydrocephalus in infancy. however , the procedure was aborted because she coded. .   . Cholecystectomy   . Colonoscopy 02/08/06    mild anal stenosis, multiple erosions o fprox rectum and sigmoid. Long redundant colon. Bx  ?ischemic colitis    Family History  Problem Relation Age of Onset  . Kidney failure Mother   . Diabetes Mother   . Prostate cancer Father   .  Colon cancer      family history   . Lymphoma      family history   . Hypertension      family history   . Colon cancer Sister 44    History   Social History  . Marital Status: Single    Spouse Name: N/A    Number of Children: 0  . Years of Education: N/A   Occupational History  . disabled     Social History Main Topics  . Smoking status: Never Smoker   . Smokeless tobacco: Not on file  . Alcohol Use: No  . Drug Use: No  . Sexually Active: Not on file   Other Topics Concern  . Not on file   Social History Narrative  . No narrative on file      ROS: Patient unable to provide review of systems. According to family review of systems are negative. General: Negative for anorexia, weight loss, fever, chills, fatigue, weakness. Eyes: Negative for  vision changes.  ENT: Negative for hoarseness, difficulty swallowing , nasal congestion. CV: Negative for chest pain, angina, palpitations, dyspnea on exertion, peripheral edema.  Respiratory: Negative for dyspnea at rest, dyspnea on exertion, cough, sputum, wheezing.  GI: See history of present illness. GU:  Negative for dysuria, hematuria, urinary incontinence, urinary frequency, nocturnal urination.  MS: Negative for joint pain, low back pain.  Derm: Negative for rash or itching.  Neuro: Negative for weakness, abnormal sensation, seizure, frequent headaches, memory loss, confusion.  Psych: Negative for anxiety, depression, suicidal ideation, hallucinations.  Endo: Negative for unusual weight change.  Heme: Negative for bruising or bleeding. Allergy: Negative for rash or hives.    Physical Examination:  BP 135/83  Pulse 73  Temp(Src) 97.3 F (36.3 C) (Temporal)   General: Well-nourished, well-developed in no acute distress. Very pleasant. Does not communicate verbally during visit. Head: Normocephalic, atraumatic.   Eyes: Conjunctiva pink, no icterus. Mouth: Oropharyngeal mucosa moist and pink , no lesions erythema or exudate. Neck: Supple without thyromegaly, masses, or lymphadenopathy.  Lungs: Clear to auscultation bilaterally.  Heart: Regular rate and rhythm, no murmurs rubs or gallops.  Abdomen: Bowel sounds are normal, nontender, nondistended, no hepatosplenomegaly or masses, no abdominal bruits or    hernia , no rebound or guarding. Exam was limited as it had to be done in a wheelchair   Rectal: Not performed. Extremities: No lower extremity edema. No clubbing or deformities.  Neuro: Alert and oriented x 4 , grossly normal neurologically.  Skin: Warm and dry, no rash or jaundice.   Psych: Alert and cooperative. Smiling constantly.

## 2010-12-18 NOTE — Assessment & Plan Note (Signed)
For surveillance colonoscopy January 2013. Family requests to postpone for a few months. We will send him a reminder in March 2013. They will need to make appointment for an office visit in order to schedule procedure. She will need a prolonged clear liquid diet and bowel prep as before. Previously received 4 days on clear liquids. Consider split dose prepping as patient would likely tolerate better. May consider giving dulcolax several days prior to procedure and/or given an additional 2 L of GoLYTELY.

## 2010-12-18 NOTE — Progress Notes (Signed)
Cc to PCP 

## 2010-12-18 NOTE — Patient Instructions (Signed)
You will be due for a colonoscopy in January 2013. As discussed during your office visit, due to family circumstances we will postpone your procedure until you are ready. We will send a reminder letter in March of 2013. He will need to have an office visit prior to scheduling the colonoscopy.  Continue stool softener, MiraLax daily for constipation.

## 2010-12-18 NOTE — Assessment & Plan Note (Signed)
Continue current regimen

## 2010-12-21 DIAGNOSIS — E1149 Type 2 diabetes mellitus with other diabetic neurological complication: Secondary | ICD-10-CM | POA: Insufficient documentation

## 2010-12-21 NOTE — Assessment & Plan Note (Signed)
Improved, no med change 

## 2010-12-21 NOTE — Assessment & Plan Note (Signed)
Low fat diet encouraged, continue medication, rept labs next year

## 2010-12-21 NOTE — Assessment & Plan Note (Signed)
Well managed on current med regime

## 2010-12-21 NOTE — Assessment & Plan Note (Signed)
Controlled, no change in medication  

## 2010-12-23 NOTE — Progress Notes (Signed)
Reminder in epic to have OV in March 2013 prior to setting up tcs

## 2011-02-14 ENCOUNTER — Other Ambulatory Visit: Payer: Self-pay | Admitting: Family Medicine

## 2011-02-23 ENCOUNTER — Other Ambulatory Visit: Payer: Self-pay | Admitting: Family Medicine

## 2011-04-06 ENCOUNTER — Encounter: Payer: Self-pay | Admitting: Gastroenterology

## 2011-04-15 ENCOUNTER — Ambulatory Visit: Payer: Medicare Other | Admitting: Gastroenterology

## 2011-04-24 ENCOUNTER — Other Ambulatory Visit: Payer: Self-pay | Admitting: Family Medicine

## 2011-05-18 ENCOUNTER — Other Ambulatory Visit: Payer: Self-pay | Admitting: Family Medicine

## 2011-05-18 ENCOUNTER — Encounter: Payer: Self-pay | Admitting: Internal Medicine

## 2011-05-19 ENCOUNTER — Ambulatory Visit (INDEPENDENT_AMBULATORY_CARE_PROVIDER_SITE_OTHER): Payer: Medicare Other | Admitting: Family Medicine

## 2011-05-19 ENCOUNTER — Ambulatory Visit (INDEPENDENT_AMBULATORY_CARE_PROVIDER_SITE_OTHER): Payer: Medicare Other | Admitting: Urgent Care

## 2011-05-19 ENCOUNTER — Encounter: Payer: Self-pay | Admitting: Family Medicine

## 2011-05-19 ENCOUNTER — Encounter: Payer: Self-pay | Admitting: Urgent Care

## 2011-05-19 VITALS — BP 142/92 | HR 83 | Temp 98.1°F

## 2011-05-19 VITALS — BP 118/80 | HR 75 | Resp 16

## 2011-05-19 DIAGNOSIS — Z86010 Personal history of colonic polyps: Secondary | ICD-10-CM | POA: Diagnosis not present

## 2011-05-19 DIAGNOSIS — E119 Type 2 diabetes mellitus without complications: Secondary | ICD-10-CM | POA: Diagnosis not present

## 2011-05-19 DIAGNOSIS — Z800 Family history of malignant neoplasm of digestive organs: Secondary | ICD-10-CM

## 2011-05-19 DIAGNOSIS — K5909 Other constipation: Secondary | ICD-10-CM | POA: Diagnosis not present

## 2011-05-19 DIAGNOSIS — K219 Gastro-esophageal reflux disease without esophagitis: Secondary | ICD-10-CM | POA: Diagnosis not present

## 2011-05-19 DIAGNOSIS — E785 Hyperlipidemia, unspecified: Secondary | ICD-10-CM

## 2011-05-19 DIAGNOSIS — R7301 Impaired fasting glucose: Secondary | ICD-10-CM | POA: Diagnosis not present

## 2011-05-19 DIAGNOSIS — R5381 Other malaise: Secondary | ICD-10-CM | POA: Diagnosis not present

## 2011-05-19 DIAGNOSIS — I10 Essential (primary) hypertension: Secondary | ICD-10-CM

## 2011-05-19 DIAGNOSIS — R5383 Other fatigue: Secondary | ICD-10-CM | POA: Diagnosis not present

## 2011-05-19 DIAGNOSIS — R569 Unspecified convulsions: Secondary | ICD-10-CM

## 2011-05-19 LAB — CBC WITH DIFFERENTIAL/PLATELET
Basophils Relative: 0 % (ref 0–1)
Eosinophils Absolute: 0.1 10*3/uL (ref 0.0–0.7)
HCT: 39.9 % (ref 36.0–46.0)
Hemoglobin: 13.3 g/dL (ref 12.0–15.0)
MCH: 29.6 pg (ref 26.0–34.0)
MCHC: 33.3 g/dL (ref 30.0–36.0)
MCV: 88.9 fL (ref 78.0–100.0)
Monocytes Absolute: 0.7 10*3/uL (ref 0.1–1.0)
Monocytes Relative: 10 % (ref 3–12)

## 2011-05-19 LAB — HEMOGLOBIN A1C: Hgb A1c MFr Bld: 5.8 % — ABNORMAL HIGH (ref <5.7)

## 2011-05-19 MED ORDER — ZOCOR 10 MG PO TABS: ORAL_TABLET | ORAL | Status: DC

## 2011-05-19 NOTE — Patient Instructions (Signed)
F/U in early October.  Please call if you need me before OK to test blood sugars twice weekly   Labs in October before next visit   No med changes  Keep well and keep smiling

## 2011-05-19 NOTE — Progress Notes (Signed)
Primary Care Physician:  Syliva Overman, MD, MD Primary Gastroenterologist:  Dr. Jena Gauss  Chief Complaint  Patient presents with  . Follow-up    colonoscopy   HPI:  April Watson is a 51 y.o. female here to set up colonoscopy with her two sisters today. She has a history of adenomatous colon polyps in 2005, one with high-grade dysplasia. She was due for a surveillance colonoscopy in January 2013, but this was postponed secondary to scheduling conflicts. In 2008, she had a prolonged bowel prep because of history of very poor bowel prep and incomplete colonoscopies. She also has a family history of colon ancer in a sister at age 31.   Denies any lower GI symptoms including constipation, diarrhea, rectal bleeding, melena or weight loss.  Past Medical History  Diagnosis Date  . Seizure disorder   . Mental retardation   . 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 Congenital hydrocephalus   . Chronic constipation   . Hyperlipidemia   . Hypertension   . GERD (gastroesophageal reflux disease)     Past Surgical History  Procedure Date  . Patient reports that they had brain surgery attempted for hydrocephalus in infancy. however , the procedure was aborted because she coded. .   . Cholecystectomy   . Colonoscopy 02/08/06    Rourk-mild anal stenosis, multiple erosions o fprox rectum and sigmoid. Long redundant colon. Bx  ?ischemic colitis  . Flexible sigmoidoscopy 08/14/2003    Rourk- Normal-appearing rectum, normal-appearing sigmoid colon to 50  cm.  Relatively poor prep made exam more difficult   Current Outpatient Prescriptions  Medication Sig Dispense Refill  . DOC-Q-LACE 100 MG capsule TAKE 1 CAPSULE TWICE DAILY FOR CONSTIPATION.  60 each  4  . GLUCOPHAGE 500 MG tablet TAKE ONE TABLET BY MOUTH TWICE DAILY WITH MEALS.  60 each  4  . glucose blood (ACCU-CHEK AVIVA) test strip Use as instructed once daily  DX 250.00  100 each  12  . Lancets (ACCU-CHEK MULTICLIX)  lancets Use as directed once daily DX 250.00  100 each  12  . MIRALAX powder MIX 1 CAPFUL (17 G) IN 8 OUNCES OF WATER/JUICE AND DRINK TWICE A DAY FOR CONSTIPATION.  1054 g  4  . PHENobarbital (LUMINAL) 32.4 MG tablet TAKE 1 TABLET TWICE DAILY FOR SEIZURE DISORDER  60 tablet  3  . potassium chloride (K-DUR,KLOR-CON) 10 MEQ tablet TAKE (1) TABLET BY MOUTH ONCE DAILY WITH FOOD.  30 tablet  4  . ZANTAC 150 MG tablet TAKE 1 TABLET BY MOUTH ONCE A DAY.  30 each  3  . ZIAC 5-6.25 MG per tablet TAKE (1) TABLET BY MOUTH ONCE DAILY FOR BLOOD PRESSURE.  30 each  4  . ZOCOR 10 MG tablet TAKE (1) TABLET BY MOUTH AT BEDTIME FOR CHOLESTEROL.  30 each  4  . DISCONTD: ZOCOR 10 MG tablet TAKE (1) TABLET BY MOUTH AT BEDTIME FOR CHOLESTEROL.  30 each  4   No Known Allergies  Family History  Problem Relation Age of Onset  . Kidney failure Mother   . Diabetes Mother   . Prostate cancer Father   . Colon cancer      family history   . Lymphoma      family history   . Hypertension      family history   . Colon cancer Sister 69    History   Social History  . Marital Status: Single  Spouse Name: N/A    Number of Children: 0  . Years of Education: N/A   Occupational History  . disabled     Social History Main Topics  . Smoking status: Never Smoker   . Smokeless tobacco: Not on file  . Alcohol Use: No  . Drug Use: No  . Sexually Active: Not on file   Other Topics Concern  . Not on file   Social History Narrative   Sisters assist w/ pt care    ROS: Patient is unable to answer. Per her sisters, negative review of systems other than history of present illness.  Physical Exam: BP 142/92  Pulse 83  Temp(Src) 98.1 F (36.7 C) (Temporal) General:   Alert, well-nourished, pleasant and cooperative in NAD.  She is wheelchair-bound and mentally challenged. She is accompanied by her sisters today. Head:  Hydrocephalic and elongated. Atraumatic. Eyes:  Sclera clear, no icterus.   Conjunctiva  pink. Ears:  Normal auditory acuity. Nose:  No deformity, discharge,  or lesions. Mouth:  No deformity or lesions, dentition poor. Neck:  Supple; no masses or thyromegaly. Lungs:  Clear throughout to auscultation.   No wheezes, crackles, or rhonchi. No acute distress. Heart:  Regular rate and rhythm; no murmurs, clicks, rubs,  or gallops. Abdomen:  Soft, nontender and nondistended. No masses, hepatosplenomegaly or hernias noted. Normal bowel sounds, without guarding, and without rebound.   Rectal:  Deferred until time of colonoscopy.   Msk:  Symmetrical. Pulses:  Normal pulses noted. Extremities:  Without  edema. Neurologic:  Alert. Skin:  Intact without significant lesions or rashes. Cervical Nodes:  No significant cervical adenopathy. Psych:  Alert and cooperative. At her baseline neurologically.

## 2011-05-19 NOTE — Assessment & Plan Note (Signed)
April Watson is a pleasant 51 y.o. female who is due for surveillance colonoscopy given history of adenomatous colon polyp 2005. She does have a family history of colon cancer in her sister in her 72s. April Watson is wheelchair-bound and mentally challenged. She is accompanied by her 2 sisters today who will assist with her colonoscopy prep.  Given history of incomplete prep years ago, she will have a tailored lengthy prep.  I have discussed risks & benefits which include, but are not limited to, bleeding, infection, perforation & drug reaction.  The patient agrees with this plan & written consent will be obtained.    She will begin Dulcolax the day before her colonoscopy prep. She will begin clear liquids the day before her colonoscopy prep begins. She will have a 2-day split prep.  If at any point her sisters are concerned that she her stools are not becoming clear or she is unable to tolerate prep, they're asked to call us. She is asked to hold her Glucophage the day of the procedure.

## 2011-05-19 NOTE — Patient Instructions (Signed)
Colonoscopy with Dr. Rourk.  

## 2011-05-19 NOTE — Progress Notes (Signed)
  Subjective:    Patient ID: April Watson, female    DOB: September 08, 1960, 51 y.o.   MRN: [id number]  HPI The PT is here for follow up and re-evaluation of chronic medical conditions, medication management and review of any available recent lab and radiology data.  Preventive health is updated, specifically  Cancer screening and Immunization.   Questions or concerns regarding consultations or procedures which the PT has had in the interim are  addressed. The PT denies any adverse reactions to current medications since the last visit.  There are no new concerns.  There are no specific complaints  Pt is aphasic, history is provided by her sisters who are her caregivers     Review of Systems See HPI, history is per family members Denies recent fever or chills. Denies sinus pressure, nasal congestion, ear pain or sore throat. Denies chest congestion, productive cough or wheezing. Denies chest pains, palpitations and leg swelling Denies abdominal pain, nausea, vomiting,diarrhea or constipation.   Denies dysuria, frequency, hesitancy or incontinence.  Chronic  limitation in mobility. Denies headaches, seizures, numbness, or tingling. Denies depression, anxiety or insomnia. Denies skin break down or rash.        Objective:   Physical Exam Patient alert and oriented and in no cardiopulmonary distress.  HEENT: No facial asymmetry, EOMI, no sinus tenderness,  oropharynx pink and moist.  Neck supple no adenopathy.  Chest: Clear to auscultation bilaterally.  CVS: S1, S2 no murmurs, no S3.  ABD: Soft non tender. Bowel sounds normal.  Ext: No edema  [id number]  ROM spine, shoulders, hips and knees.  Skin: Intact, no ulcerations or rash noted.  Psych: Good eye contact, normal affect.Not anxious or depressed appearing.  CNS: CN 2-12 intact, power, tone and sensation normal throughout.        Assessment & Plan:

## 2011-05-20 LAB — COMPLETE METABOLIC PANEL WITH GFR
Albumin: 4.7 g/dL (ref 3.5–5.2)
Alkaline Phosphatase: 66 U/L (ref 39–117)
BUN: 6 mg/dL (ref 6–23)
GFR, Est African American: >89 mL/min
GFR, Est Non African American: >89 mL/min
Glucose, Bld: 87 mg/dL (ref 70–99)
Total Bilirubin: 0.3 mg/dL (ref 0.3–1.2)

## 2011-05-20 LAB — LIPID PANEL
Cholesterol: 186 mg/dL (ref 0–200)
HDL: 43 mg/dL (ref >39)
Total CHOL/HDL Ratio: 4.3 Ratio

## 2011-05-20 NOTE — Progress Notes (Signed)
Faxed to PCP

## 2011-05-22 ENCOUNTER — Other Ambulatory Visit: Payer: Self-pay | Admitting: Gastroenterology

## 2011-05-22 DIAGNOSIS — Z800 Family history of malignant neoplasm of digestive organs: Secondary | ICD-10-CM

## 2011-05-22 MED ORDER — PEG-KCL-NACL-NASULF-NA ASC-C 100 G PO SOLR: 1.0000 | Freq: Once | ORAL | Status: DC

## 2011-05-24 NOTE — Assessment & Plan Note (Signed)
Low fat encouraged, no change in medication at this time

## 2011-05-24 NOTE — Assessment & Plan Note (Signed)
Controlled, no change in medication  

## 2011-05-24 NOTE — Assessment & Plan Note (Signed)
Controlled , will asses need for ongoing medication based on updated lab

## 2011-05-24 NOTE — Assessment & Plan Note (Signed)
Seizure free on chronic medication

## 2011-06-02 MED ORDER — MIDAZOLAM HCL 2 MG/2ML IJ SOLN
INTRAMUSCULAR | Status: AC
  Filled 2011-06-02: qty 2

## 2011-06-02 MED ORDER — SCOPOLAMINE 1 MG/3DAYS TD PT72
MEDICATED_PATCH | TRANSDERMAL | Status: AC
  Filled 2011-06-02: qty 1

## 2011-06-02 MED ORDER — ONDANSETRON HCL 4 MG/2ML IJ SOLN
INTRAMUSCULAR | Status: AC
  Filled 2011-06-02: qty 2

## 2011-06-02 MED ORDER — GLYCOPYRROLATE 0.2 MG/ML IJ SOLN
INTRAMUSCULAR | Status: AC
  Filled 2011-06-02: qty 1

## 2011-06-02 MED ORDER — DEXAMETHASONE SODIUM PHOSPHATE 4 MG/ML IJ SOLN
INTRAMUSCULAR | Status: AC
  Filled 2011-06-02: qty 1

## 2011-06-08 ENCOUNTER — Encounter (HOSPITAL_COMMUNITY): Payer: Self-pay | Admitting: Pharmacy Technician

## 2011-06-11 MED ORDER — SODIUM CHLORIDE 0.45 % IV SOLN
Freq: Once | INTRAVENOUS | Status: AC
  Administered 2011-06-12: 1000 mL via INTRAVENOUS

## 2011-06-12 ENCOUNTER — Encounter (HOSPITAL_COMMUNITY)
Admission: RE | Disposition: A | Discharge status: Home / Self Care | Payer: Self-pay | Source: Ambulatory Visit | Attending: Internal Medicine

## 2011-06-12 ENCOUNTER — Ambulatory Visit (HOSPITAL_COMMUNITY)
Admission: RE | Admit: 2011-06-12 | Discharge: 2011-06-12 | Disposition: A | Discharge status: Home / Self Care | Payer: Medicare Other | Source: Ambulatory Visit | Attending: Internal Medicine | Admitting: Internal Medicine

## 2011-06-12 ENCOUNTER — Encounter (HOSPITAL_COMMUNITY): Payer: Self-pay | Admitting: *Deleted

## 2011-06-12 DIAGNOSIS — E785 Hyperlipidemia, unspecified: Secondary | ICD-10-CM | POA: Diagnosis not present

## 2011-06-12 DIAGNOSIS — Z09 Encounter for follow-up examination after completed treatment for conditions other than malignant neoplasm: Secondary | ICD-10-CM | POA: Diagnosis not present

## 2011-06-12 DIAGNOSIS — Z538 Procedure and treatment not carried out for other reasons: Secondary | ICD-10-CM | POA: Insufficient documentation

## 2011-06-12 DIAGNOSIS — Z79899 Other long term (current) drug therapy: Secondary | ICD-10-CM | POA: Diagnosis not present

## 2011-06-12 DIAGNOSIS — E119 Type 2 diabetes mellitus without complications: Secondary | ICD-10-CM | POA: Diagnosis not present

## 2011-06-12 DIAGNOSIS — Z01812 Encounter for preprocedural laboratory examination: Secondary | ICD-10-CM | POA: Diagnosis not present

## 2011-06-12 DIAGNOSIS — Z800 Family history of malignant neoplasm of digestive organs: Secondary | ICD-10-CM | POA: Insufficient documentation

## 2011-06-12 DIAGNOSIS — I10 Essential (primary) hypertension: Secondary | ICD-10-CM | POA: Insufficient documentation

## 2011-06-12 DIAGNOSIS — Z86010 Personal history of colonic polyps: Secondary | ICD-10-CM

## 2011-06-12 HISTORY — PX: COLONOSCOPY: SHX5424

## 2011-06-12 HISTORY — DX: Unspecified convulsions: R56.9

## 2011-06-12 LAB — GLUCOSE, CAPILLARY: Glucose-Capillary: 78 mg/dL (ref 70–99)

## 2011-06-12 SURGERY — COLONOSCOPY
Anesthesia: Moderate Sedation

## 2011-06-12 MED ORDER — MIDAZOLAM HCL 5 MG/5ML IJ SOLN
INTRAMUSCULAR | Status: AC
  Filled 2011-06-12: qty 10

## 2011-06-12 MED ORDER — MEPERIDINE HCL 100 MG/ML IJ SOLN
INTRAMUSCULAR | Status: AC
  Filled 2011-06-12: qty 1

## 2011-06-12 NOTE — Interval H&P Note (Signed)
History and Physical Interval Note:  06/12/2011 11:57 AM  April Watson  has presented today for surgery, with the diagnosis of family hx of CRC  The various methods of treatment have been discussed with the patient and family. After consideration of risks, benefits and other options for treatment, the patient has consented to  Procedure(s) (LRB): COLONOSCOPY (N/A) as a surgical intervention .  The patients' history has been reviewed, patient examined, no change in status, stable for surgery.  I have reviewed the patients' chart and labs.  Questions were answered to the patient's satisfaction.     Eula Listen

## 2011-06-12 NOTE — Discharge Instructions (Signed)
Colonoscopy Discharge Instructions  Read the instructions outlined below and refer to this sheet in the next few weeks. These discharge instructions provide you with general information on caring for yourself after you leave the hospital. Your doctor may also give you specific instructions. While your treatment has been planned according to the most current medical practices available, unavoidable complications occasionally occur. If you have any problems or questions after discharge, call Dr. Jena Gauss at [phone]. ACTIVITY  You may resume your regular activity, but move at a slower pace for the next 24 hours.   Take frequent rest periods for the next 24 hours.   Walking will help get rid of the air and reduce the bloated feeling in your belly (abdomen).   No driving for 24 hours (because of the medicine (anesthesia) used during the test).    Do not sign any important legal documents or operate any machinery for 24 hours (because of the anesthesia used during the test).  NUTRITION  Drink plenty of fluids.   You may resume your normal diet as instructed by your doctor.   Begin with a light meal and progress to your normal diet. Heavy or fried foods are harder to digest and may make you feel sick to your stomach (nauseated).   Avoid alcoholic beverages for 24 hours or as instructed.  MEDICATIONS  You may resume your normal medications unless your doctor tells you otherwise.  WHAT YOU CAN EXPECT TODAY  Some feelings of bloating in the abdomen.   Passage of more gas than usual.   Spotting of blood in your stool or on the toilet paper.  IF YOU HAD POLYPS REMOVED DURING THE COLONOSCOPY:  No aspirin products for 7 days or as instructed.   No alcohol for 7 days or as instructed.   Eat a soft diet for the next 24 hours.  FINDING OUT THE RESULTS OF YOUR TEST Not all test results are available during your visit. If your test results are not back during the visit, make an appointment  with your caregiver to find out the results. Do not assume everything is normal if you have not heard from your caregiver or the medical facility. It is important for you to follow up on all of your test results.  SEEK IMMEDIATE MEDICAL ATTENTION IF:  You have more than a spotting of blood in your stool.   Your belly is swollen (abdominal distention).   You are nauseated or vomiting.   You have a temperature over 101.   You have abdominal pain or discomfort that is severe or gets worse throughout the day.   Colonoscopy not successful as discussed. We will attempt barium enema in the near future when you are ready to schedule to take pictures of the up stream colon not seen today.Colonoscopy A colonoscopy is an exam to evaluate your entire colon. In this exam, your colon is cleansed. A long fiberoptic tube is inserted through your rectum and into your colon. The fiberoptic scope (endoscope) is a long bundle of enclosed and very flexible fibers. These fibers transmit light to the area examined and send images from that area to your caregiver. Discomfort is usually minimal. You may be given a drug to help you sleep (sedative) during or prior to the procedure. This exam helps to detect lumps (tumors), polyps, inflammation, and areas of bleeding. Your caregiver may also take a small piece of tissue (biopsy) that will be examined under a microscope. LET YOUR CAREGIVER KNOW ABOUT:  Allergies to food or medicine.   Medicines taken, including vitamins, herbs, eyedrops, over-the-counter medicines, and creams.   Use of steroids (by mouth or creams).   Previous problems with anesthetics or numbing medicines.   History of bleeding problems or blood clots.   Previous surgery.   Other health problems, including diabetes and kidney problems.   Possibility of pregnancy, if this applies.  BEFORE THE PROCEDURE   A clear liquid diet may be required for 2 days before the exam.   Ask your caregiver  about changing or stopping your regular medications.   Liquid injections (enemas) or laxatives may be required.   A large amount of electrolyte solution may be given to you to drink over a short period of time. This solution is used to clean out your colon.   You should be present 60 minutes prior to your procedure or as directed by your caregiver.  AFTER THE PROCEDURE   If you received a sedative or pain relieving medication, you will need to arrange for someone to drive you home.   Occasionally, there is a little blood passed with the first bowel movement. Do not be concerned.  FINDING OUT THE RESULTS OF YOUR TEST Not all test results are available during your visit. If your test results are not back during the visit, make an appointment with your caregiver to find out the results. Do not assume everything is normal if you have not heard from your caregiver or the medical facility. It is important for you to follow up on all of your test results. HOME CARE INSTRUCTIONS   It is not unusual to pass moderate amounts of gas and experience mild abdominal cramping following the procedure. This is due to air being used to inflate your colon during the exam. Walking or a warm pack on your belly (abdomen) may help.   You may resume all normal meals and activities after sedatives and medicines have worn off.   Only take over-the-counter or prescription medicines for pain, discomfort, or fever as directed by your caregiver. Do not use aspirin or blood thinners if a biopsy was taken. Consult your caregiver for medicine usage if biopsies were taken.  SEEK IMMEDIATE MEDICAL CARE IF:   You have a fever.   You pass large blood clots or fill a toilet with blood following the procedure. This may also occur 10 to 14 days following the procedure. This is more likely if a biopsy was taken.   You develop abdominal pain that keeps getting worse and cannot be relieved with medicine.  Document Released:  01/03/2000 Document Revised: 12/25/2010 Document Reviewed: 08/18/2007 [facility] Patient Information 2012 [place], [place].

## 2011-06-12 NOTE — H&P (View-Only) (Signed)
  Subjective:    Patient ID: April Watson, female    DOB: 05/27/1960, 50 y.o.   MRN: [id number]  HPI The PT is here for follow up and re-evaluation of chronic medical conditions, medication management and review of any available recent lab and radiology data.  Preventive health is updated, specifically  Cancer screening and Immunization.   Questions or concerns regarding consultations or procedures which the PT has had in the interim are  addressed. The PT denies any adverse reactions to current medications since the last visit.  There are no new concerns.  There are no specific complaints  Pt is aphasic, history is provided by her sisters who are her caregivers     Review of Systems See HPI, history is per family members Denies recent fever or chills. Denies sinus pressure, nasal congestion, ear pain or sore throat. Denies chest congestion, productive cough or wheezing. Denies chest pains, palpitations and leg swelling Denies abdominal pain, nausea, vomiting,diarrhea or constipation.   Denies dysuria, frequency, hesitancy or incontinence.  Chronic  limitation in mobility. Denies headaches, seizures, numbness, or tingling. Denies depression, anxiety or insomnia. Denies skin break down or rash.        Objective:   Physical Exam Patient alert and oriented and in no cardiopulmonary distress.  HEENT: No facial asymmetry, EOMI, no sinus tenderness,  oropharynx pink and moist.  Neck supple no adenopathy.  Chest: Clear to auscultation bilaterally.  CVS: S1, S2 no murmurs, no S3.  ABD: Soft non tender. Bowel sounds normal.  Ext: No edema  MS:decreased  ROM spine, shoulders, hips and knees.  Skin: Intact, no ulcerations or rash noted.  Psych: Good eye contact, normal affect.Not anxious or depressed appearing.  CNS: CN 2-12 intact, power, tone and sensation normal throughout.        Assessment & Plan:   

## 2011-06-14 NOTE — Op Note (Signed)
[facility] [address] [place], [place]  [address]  COLONOSCOPY PROCEDURE REPORT  PATIENT:  April Watson, April Watson  MR#:  [id number] BIRTHDATE:  10/20/1960, 50 yrs. old  GENDER:  female ENDOSCOPIST:  R. Roetta Sessions, MD FACP [facility] REF. BY:  Syliva Overman, M.D. PROCEDURE DATE:  06/12/2011 PROCEDURE:  Attempted colonoscopy  INDICATIONS:  Family history of colon cancer and personal history of colonic adenoma  INFORMED CONSENT:  The risks, benefits, alternatives and imponderables including but not limited to bleeding, perforation as well as the possibility of a missed lesion have been reviewed. The potential for biopsy, lesion removal, etc. have also been discussed.  Questions have been answered.  All parties agreeable. Please see the history and physical in the medical record for more information.  MEDICATIONS:  None given. Patient very poor peripheral IV access. Even the PICC team and ultrasound could not find a peripheral IV site  DESCRIPTION OF PROCEDURE:  After a digital rectal exam was performed, the EC-[id number] ([id number]) and [id number] ([id number]) colonoscope was advanced from the anus through the rectum and colon to the area of the cecum, ileocecal valve and appendiceal orifice.  The cecum was deeply intubated.  These structures were well-seen and photographed for the record.  From the level of the cecum and ileocecal valve, the scope was slowly and cautiously withdrawn.  The mucosal surfaces were carefully surveyed utilizing scope tip deflection to facilitate fold flattening as needed.  The scope was pulled down into the rectum where a thorough examination including retroflexion was performed. <<PROCEDUREIMAGES>>  FINDINGS: Poor/inadequate preparation. Large dilated rectum and sigmoid colon large amount of vegetable matter / stool in the colonic effluent precluded completion of examination.  THERAPEUTIC / DIAGNOSTIC MANEUVERS PERFORMED: None  COMPLICATIONS:   None CECAL WITHDRAWAL TIME: Not applicable  IMPRESSION:  Incomplete colonoscopy/inadequate prep  RECOMMENDATIONS:  Will have patient come back in 4 weeks to attempt an air-contrast barium enema to image the upstream colon not seen today. Family wants to re-roup. They will call us back when they are ready to schedule the procedure er plan  ______________________________ R. Roetta Sessions, MD Caleen Essex  CC:  Syliva Overman, M.D.  n. eSIGNED:   R. Roetta Sessions at 06/14/2011 01:48 PM  Glory Buff, [phone]

## 2011-06-17 ENCOUNTER — Encounter (HOSPITAL_COMMUNITY): Payer: Self-pay | Admitting: Internal Medicine

## 2011-06-24 ENCOUNTER — Other Ambulatory Visit: Payer: Self-pay | Admitting: Family Medicine

## 2011-07-16 ENCOUNTER — Other Ambulatory Visit: Payer: Self-pay | Admitting: Family Medicine

## 2011-08-25 ENCOUNTER — Other Ambulatory Visit: Payer: Self-pay | Admitting: Family Medicine

## 2011-09-14 ENCOUNTER — Other Ambulatory Visit: Payer: Self-pay | Admitting: Family Medicine

## 2011-10-08 ENCOUNTER — Encounter: Payer: Self-pay | Admitting: *Deleted

## 2011-10-15 ENCOUNTER — Other Ambulatory Visit: Payer: Self-pay | Admitting: Family Medicine

## 2011-10-20 ENCOUNTER — Other Ambulatory Visit: Payer: Self-pay | Admitting: Family Medicine

## 2011-11-12 ENCOUNTER — Telehealth: Payer: Self-pay | Admitting: Family Medicine

## 2011-11-12 DIAGNOSIS — R5383 Other fatigue: Secondary | ICD-10-CM

## 2011-11-12 DIAGNOSIS — I10 Essential (primary) hypertension: Secondary | ICD-10-CM

## 2011-11-12 DIAGNOSIS — E785 Hyperlipidemia, unspecified: Secondary | ICD-10-CM

## 2011-11-12 DIAGNOSIS — E119 Type 2 diabetes mellitus without complications: Secondary | ICD-10-CM

## 2011-11-12 NOTE — Telephone Encounter (Signed)
Order CMET, CBC, A1C, FLP, diagnosis in chart

## 2011-11-12 NOTE — Telephone Encounter (Signed)
Pt coming in Monday. Hasn't had labs done since last year. Can you order labwork for her to have done tomorrow?

## 2011-11-12 NOTE — Telephone Encounter (Signed)
mattie aware and order faxed to lab

## 2011-11-16 ENCOUNTER — Ambulatory Visit (INDEPENDENT_AMBULATORY_CARE_PROVIDER_SITE_OTHER): Payer: Medicare Other | Admitting: Family Medicine

## 2011-11-16 ENCOUNTER — Other Ambulatory Visit: Payer: Self-pay | Admitting: Family Medicine

## 2011-11-16 ENCOUNTER — Encounter: Payer: Self-pay | Admitting: Family Medicine

## 2011-11-16 VITALS — BP 138/92 | HR 89 | Resp 15

## 2011-11-16 DIAGNOSIS — Z23 Encounter for immunization: Secondary | ICD-10-CM | POA: Diagnosis not present

## 2011-11-16 DIAGNOSIS — R569 Unspecified convulsions: Secondary | ICD-10-CM

## 2011-11-16 DIAGNOSIS — R5381 Other malaise: Secondary | ICD-10-CM | POA: Diagnosis not present

## 2011-11-16 DIAGNOSIS — I10 Essential (primary) hypertension: Secondary | ICD-10-CM | POA: Diagnosis not present

## 2011-11-16 DIAGNOSIS — E119 Type 2 diabetes mellitus without complications: Secondary | ICD-10-CM

## 2011-11-16 DIAGNOSIS — Z860100 Personal history of colon polyps, unspecified: Secondary | ICD-10-CM

## 2011-11-16 DIAGNOSIS — K5909 Other constipation: Secondary | ICD-10-CM | POA: Diagnosis not present

## 2011-11-16 DIAGNOSIS — E785 Hyperlipidemia, unspecified: Secondary | ICD-10-CM

## 2011-11-16 DIAGNOSIS — H6120 Impacted cerumen, unspecified ear: Secondary | ICD-10-CM

## 2011-11-16 LAB — CBC WITH DIFFERENTIAL/PLATELET
Basophils Absolute: 0 10*3/uL (ref 0.0–0.1)
Basophils Relative: 1 % (ref 0–1)
Eosinophils Relative: 1 % (ref 0–5)
Lymphocytes Relative: 32 % (ref 12–46)
MCV: 82.8 fL (ref 78.0–100.0)
Platelets: 321 10*3/uL (ref 150–400)
RDW: 12.6 % (ref 11.5–15.5)
WBC: 6.3 10*3/uL (ref 4.0–10.5)

## 2011-11-16 LAB — LIPID PANEL
Cholesterol: 175 mg/dL (ref 0–200)
Total CHOL/HDL Ratio: 3.8 Ratio

## 2011-11-16 LAB — BASIC METABOLIC PANEL
BUN: 8 mg/dL (ref 6–23)
Calcium: 10.1 mg/dL (ref 8.4–10.5)
Creat: 0.59 mg/dL (ref 0.50–1.10)
Glucose, Bld: 80 mg/dL (ref 70–99)
Sodium: 128 mEq/L — ABNORMAL LOW (ref 135–145)

## 2011-11-16 MED ORDER — RANITIDINE HCL 150 MG PO TABS: 150.0000 mg | ORAL_TABLET | Freq: Every day | ORAL | Status: DC

## 2011-11-16 MED ORDER — BISOPROLOL-HYDROCHLOROTHIAZIDE 5-6.25 MG PO TABS: 1.0000 | ORAL_TABLET | Freq: Every day | ORAL | Status: DC

## 2011-11-16 MED ORDER — POLYETHYLENE GLYCOL 3350 17 GM/SCOOP PO POWD: 17.0000 g | Freq: Every day | ORAL | Status: DC

## 2011-11-16 MED ORDER — ZOCOR 10 MG PO TABS: ORAL_TABLET | ORAL | Status: DC

## 2011-11-16 NOTE — Assessment & Plan Note (Signed)
Stable , no recent seizure activity

## 2011-11-16 NOTE — Assessment & Plan Note (Signed)
Blood pressure slightly elevated at visit. No med change, dietary change only

## 2011-11-16 NOTE — Assessment & Plan Note (Signed)
Updated lab today 

## 2011-11-16 NOTE — Assessment & Plan Note (Signed)
stool specimens to be returned at follow up

## 2011-11-16 NOTE — Progress Notes (Signed)
  Subjective:    Patient ID: April Watson, female    DOB: [birth year], 51 y.o.   MRN: [id number]  HPI Pt in for follow up of her chronic illnesses. Recent colonoscopy attempt  due to heavy stool burden despite family's best attempt. They will reconsider attempts to rept this at a later date. Appetite unchanged, "not the best " but adequate, blood sugars seldom over 90 fasting. Bowel movements remain regular, no malodorous urine, fever chills or cough. No skin breakdown   Review of Systems See HPI, history provided by family members Denies recent fever or chills. Denies e, nasal congestion, . Denies chest congestion, productive cough or wheezing. Denies chest pains, palpitations and leg swelling Denies abdominal pain, nausea, vomiting,diarrhea or constipation.    Chronic limitation in mobility, due to lower extremity weakness, from congenital hydrocephalus, wheelchair dependent. Requests gel cushion Denies headaches, seizures, numbness, or tingling. Denies  insomnia. Denies skin break down or rash.        Objective:   Physical Exam Patient alert  and in no cardiopulmonary distress.Expressive aphasia. Macrocephaly  HEENT: No facial asymmetry, EOMI, no sinus tenderness,  oropharynx pink and moist.  Neck decreased ROM no adenopathy.Bilateral cerumen impaction  Chest: Clear to auscultation bilaterally.  CVS: S1, S2 no murmurs, no S3.  ABD: Soft non tender. Bowel sounds normal.  Ext: No edema  MS: Decreased  ROM spine, shoulders, hips and knees.  Skin: Intact, no ulcerations or rash noted.  Psych: Good eye contact, not anxious or depressed appearing.  CNS: CN 2-12 intact, power and  tone decreased in upper and lower extremities        Assessment & Plan:

## 2011-11-16 NOTE — Assessment & Plan Note (Signed)
Updated lab today Hyperlipidemia:Low fat diet discussed and encouraged.   

## 2011-11-16 NOTE — Patient Instructions (Addendum)
F/u in 5.5 month  Please bring back 3 FOB stool cards to next visit for colons screen  No med changes, and keep well.  Call if you need me please   Flu vaccine today    Gel cushion script to be given  Bilateral ear irrigation today.  Please cut back on salty food, and eat a lot of fruit and vegetable blood pressure slightly high.  Consider mammogram , you need one

## 2011-11-16 NOTE — Assessment & Plan Note (Signed)
Bilateral ear irrigation with no complications and succesful cerumen removal

## 2011-11-16 NOTE — Assessment & Plan Note (Signed)
Adequately managed on current regime

## 2011-11-17 ENCOUNTER — Other Ambulatory Visit: Payer: Self-pay | Admitting: Family Medicine

## 2011-11-19 ENCOUNTER — Telehealth: Payer: Self-pay | Admitting: Family Medicine

## 2011-11-19 NOTE — Telephone Encounter (Signed)
Just apply topical antibiotic like neosporin OTC, no rx , twice daily, and most important, keep her off the area, with turning and pillows for support so that it heals

## 2011-11-19 NOTE — Telephone Encounter (Signed)
Noticed a little skin break on her left hip. States its a small area but she wants to know if she needs to be using anything on it or needs something sent in for it

## 2011-11-20 NOTE — Telephone Encounter (Signed)
mattie aware

## 2011-12-11 ENCOUNTER — Telehealth: Payer: Self-pay

## 2011-12-11 NOTE — Telephone Encounter (Signed)
Patient wheelchair bound - For a few weeks family has been watching a sore that had been developing on left hip. Pt keeps leaning on her left side even when repositioned. Now the area has developed into a stage 2 sore (3-4 cm wide abd 1/4-1/2 cm deep) Mainly pink in color- no odor. Recommends wound care nurse evaluation. They don't have any at [facility] so a referral to [facility] for would care is recommended. Also she thinks a gel pack to apply to the area would be helpful and relieve some of the pressure. Please advise

## 2011-12-11 NOTE — Telephone Encounter (Signed)
Referred to [facility] for wound eval

## 2011-12-11 NOTE — Telephone Encounter (Signed)
You can send orders for [facility] to assess and treat stage II ulcer

## 2011-12-12 DIAGNOSIS — G40909 Epilepsy, unspecified, not intractable, without status epilepticus: Secondary | ICD-10-CM | POA: Diagnosis not present

## 2011-12-12 DIAGNOSIS — I10 Essential (primary) hypertension: Secondary | ICD-10-CM | POA: Diagnosis not present

## 2011-12-12 DIAGNOSIS — L8992 Pressure ulcer of unspecified site, stage 2: Secondary | ICD-10-CM | POA: Diagnosis not present

## 2011-12-12 DIAGNOSIS — Q039 Congenital hydrocephalus, unspecified: Secondary | ICD-10-CM | POA: Diagnosis not present

## 2011-12-12 DIAGNOSIS — L89209 Pressure ulcer of unspecified hip, unspecified stage: Secondary | ICD-10-CM | POA: Diagnosis not present

## 2011-12-12 DIAGNOSIS — K5900 Constipation, unspecified: Secondary | ICD-10-CM | POA: Diagnosis not present

## 2011-12-14 DIAGNOSIS — I10 Essential (primary) hypertension: Secondary | ICD-10-CM | POA: Diagnosis not present

## 2011-12-14 DIAGNOSIS — K5900 Constipation, unspecified: Secondary | ICD-10-CM | POA: Diagnosis not present

## 2011-12-14 DIAGNOSIS — L8992 Pressure ulcer of unspecified site, stage 2: Secondary | ICD-10-CM | POA: Diagnosis not present

## 2011-12-14 DIAGNOSIS — Q039 Congenital hydrocephalus, unspecified: Secondary | ICD-10-CM | POA: Diagnosis not present

## 2011-12-14 DIAGNOSIS — L89209 Pressure ulcer of unspecified hip, unspecified stage: Secondary | ICD-10-CM | POA: Diagnosis not present

## 2011-12-14 DIAGNOSIS — G40909 Epilepsy, unspecified, not intractable, without status epilepticus: Secondary | ICD-10-CM | POA: Diagnosis not present

## 2011-12-16 DIAGNOSIS — L8992 Pressure ulcer of unspecified site, stage 2: Secondary | ICD-10-CM | POA: Diagnosis not present

## 2011-12-16 DIAGNOSIS — G40909 Epilepsy, unspecified, not intractable, without status epilepticus: Secondary | ICD-10-CM | POA: Diagnosis not present

## 2011-12-16 DIAGNOSIS — K5900 Constipation, unspecified: Secondary | ICD-10-CM | POA: Diagnosis not present

## 2011-12-16 DIAGNOSIS — Q039 Congenital hydrocephalus, unspecified: Secondary | ICD-10-CM | POA: Diagnosis not present

## 2011-12-16 DIAGNOSIS — L89209 Pressure ulcer of unspecified hip, unspecified stage: Secondary | ICD-10-CM | POA: Diagnosis not present

## 2011-12-16 DIAGNOSIS — I10 Essential (primary) hypertension: Secondary | ICD-10-CM | POA: Diagnosis not present

## 2011-12-22 ENCOUNTER — Telehealth: Payer: Self-pay

## 2011-12-22 ENCOUNTER — Other Ambulatory Visit: Payer: Self-pay

## 2011-12-22 ENCOUNTER — Other Ambulatory Visit: Payer: Self-pay | Admitting: Family Medicine

## 2011-12-22 DIAGNOSIS — I10 Essential (primary) hypertension: Secondary | ICD-10-CM | POA: Diagnosis not present

## 2011-12-22 DIAGNOSIS — IMO0002 Reserved for concepts with insufficient information to code with codable children: Secondary | ICD-10-CM

## 2011-12-22 DIAGNOSIS — Q039 Congenital hydrocephalus, unspecified: Secondary | ICD-10-CM | POA: Diagnosis not present

## 2011-12-22 DIAGNOSIS — G40909 Epilepsy, unspecified, not intractable, without status epilepticus: Secondary | ICD-10-CM | POA: Diagnosis not present

## 2011-12-22 DIAGNOSIS — K5900 Constipation, unspecified: Secondary | ICD-10-CM | POA: Diagnosis not present

## 2011-12-22 DIAGNOSIS — L89209 Pressure ulcer of unspecified hip, unspecified stage: Secondary | ICD-10-CM | POA: Diagnosis not present

## 2011-12-22 DIAGNOSIS — L8992 Pressure ulcer of unspecified site, stage 2: Secondary | ICD-10-CM | POA: Diagnosis not present

## 2011-12-22 MED ORDER — SULFAMETHOXAZOLE-TRIMETHOPRIM 800-160 MG PO TABS: 1.0000 | ORAL_TABLET | Freq: Two times a day (BID) | ORAL | Status: DC

## 2011-12-22 NOTE — Telephone Encounter (Signed)
Will hold off on referral to wound center for now, pls f/u with family, pls go ahead and send in the antibiotic today Pls ask the nurse to let family know to call to spk with you in the am

## 2011-12-22 NOTE — Telephone Encounter (Signed)
noted 

## 2011-12-22 NOTE — Telephone Encounter (Signed)
The wound measures 1.3x2x0.2.  April Watson with [facility] states that she will call with a status update next week.  Unable to notify family today due to not being available.  Cell phone number for sister incorrect.  Will try again in the morning.

## 2011-12-22 NOTE — Telephone Encounter (Signed)
Can I hear from the wound nurse width and depth of the ulcer. i will likely refer for wound care to follow as well. Will also start septra twice daily for 1 week.pls fax in abiotic after you spk with nurse and family Please get back to me asap.  Give verbal OK to increase nurse visits and change treatment as requested please

## 2011-12-23 NOTE — Telephone Encounter (Signed)
Family aware  

## 2011-12-25 DIAGNOSIS — L89209 Pressure ulcer of unspecified hip, unspecified stage: Secondary | ICD-10-CM | POA: Diagnosis not present

## 2011-12-25 DIAGNOSIS — G40909 Epilepsy, unspecified, not intractable, without status epilepticus: Secondary | ICD-10-CM | POA: Diagnosis not present

## 2011-12-25 DIAGNOSIS — Q039 Congenital hydrocephalus, unspecified: Secondary | ICD-10-CM | POA: Diagnosis not present

## 2011-12-25 DIAGNOSIS — K5900 Constipation, unspecified: Secondary | ICD-10-CM | POA: Diagnosis not present

## 2011-12-25 DIAGNOSIS — L8992 Pressure ulcer of unspecified site, stage 2: Secondary | ICD-10-CM | POA: Diagnosis not present

## 2011-12-25 DIAGNOSIS — I10 Essential (primary) hypertension: Secondary | ICD-10-CM | POA: Diagnosis not present

## 2011-12-28 DIAGNOSIS — L8992 Pressure ulcer of unspecified site, stage 2: Secondary | ICD-10-CM | POA: Diagnosis not present

## 2011-12-28 DIAGNOSIS — I10 Essential (primary) hypertension: Secondary | ICD-10-CM | POA: Diagnosis not present

## 2011-12-28 DIAGNOSIS — Q039 Congenital hydrocephalus, unspecified: Secondary | ICD-10-CM | POA: Diagnosis not present

## 2011-12-28 DIAGNOSIS — G40909 Epilepsy, unspecified, not intractable, without status epilepticus: Secondary | ICD-10-CM | POA: Diagnosis not present

## 2011-12-28 DIAGNOSIS — K5900 Constipation, unspecified: Secondary | ICD-10-CM | POA: Diagnosis not present

## 2011-12-28 DIAGNOSIS — L89209 Pressure ulcer of unspecified hip, unspecified stage: Secondary | ICD-10-CM | POA: Diagnosis not present

## 2011-12-30 DIAGNOSIS — L8992 Pressure ulcer of unspecified site, stage 2: Secondary | ICD-10-CM | POA: Diagnosis not present

## 2011-12-30 DIAGNOSIS — K5900 Constipation, unspecified: Secondary | ICD-10-CM | POA: Diagnosis not present

## 2011-12-30 DIAGNOSIS — G40909 Epilepsy, unspecified, not intractable, without status epilepticus: Secondary | ICD-10-CM | POA: Diagnosis not present

## 2011-12-30 DIAGNOSIS — Q039 Congenital hydrocephalus, unspecified: Secondary | ICD-10-CM | POA: Diagnosis not present

## 2011-12-30 DIAGNOSIS — I10 Essential (primary) hypertension: Secondary | ICD-10-CM | POA: Diagnosis not present

## 2011-12-30 DIAGNOSIS — L89209 Pressure ulcer of unspecified hip, unspecified stage: Secondary | ICD-10-CM | POA: Diagnosis not present

## 2012-01-01 DIAGNOSIS — L89209 Pressure ulcer of unspecified hip, unspecified stage: Secondary | ICD-10-CM | POA: Diagnosis not present

## 2012-01-01 DIAGNOSIS — L8992 Pressure ulcer of unspecified site, stage 2: Secondary | ICD-10-CM | POA: Diagnosis not present

## 2012-01-01 DIAGNOSIS — K5900 Constipation, unspecified: Secondary | ICD-10-CM | POA: Diagnosis not present

## 2012-01-01 DIAGNOSIS — Q039 Congenital hydrocephalus, unspecified: Secondary | ICD-10-CM | POA: Diagnosis not present

## 2012-01-01 DIAGNOSIS — G40909 Epilepsy, unspecified, not intractable, without status epilepticus: Secondary | ICD-10-CM | POA: Diagnosis not present

## 2012-01-01 DIAGNOSIS — I10 Essential (primary) hypertension: Secondary | ICD-10-CM | POA: Diagnosis not present

## 2012-01-08 DIAGNOSIS — Q039 Congenital hydrocephalus, unspecified: Secondary | ICD-10-CM | POA: Diagnosis not present

## 2012-01-08 DIAGNOSIS — K5900 Constipation, unspecified: Secondary | ICD-10-CM | POA: Diagnosis not present

## 2012-01-08 DIAGNOSIS — L8992 Pressure ulcer of unspecified site, stage 2: Secondary | ICD-10-CM | POA: Diagnosis not present

## 2012-01-08 DIAGNOSIS — I10 Essential (primary) hypertension: Secondary | ICD-10-CM | POA: Diagnosis not present

## 2012-01-08 DIAGNOSIS — L89209 Pressure ulcer of unspecified hip, unspecified stage: Secondary | ICD-10-CM | POA: Diagnosis not present

## 2012-01-08 DIAGNOSIS — G40909 Epilepsy, unspecified, not intractable, without status epilepticus: Secondary | ICD-10-CM | POA: Diagnosis not present

## 2012-01-15 DIAGNOSIS — Q039 Congenital hydrocephalus, unspecified: Secondary | ICD-10-CM | POA: Diagnosis not present

## 2012-01-15 DIAGNOSIS — G40909 Epilepsy, unspecified, not intractable, without status epilepticus: Secondary | ICD-10-CM | POA: Diagnosis not present

## 2012-01-15 DIAGNOSIS — L89209 Pressure ulcer of unspecified hip, unspecified stage: Secondary | ICD-10-CM | POA: Diagnosis not present

## 2012-01-15 DIAGNOSIS — L8992 Pressure ulcer of unspecified site, stage 2: Secondary | ICD-10-CM | POA: Diagnosis not present

## 2012-01-15 DIAGNOSIS — I10 Essential (primary) hypertension: Secondary | ICD-10-CM | POA: Diagnosis not present

## 2012-01-15 DIAGNOSIS — K5900 Constipation, unspecified: Secondary | ICD-10-CM | POA: Diagnosis not present

## 2012-01-22 DIAGNOSIS — G40909 Epilepsy, unspecified, not intractable, without status epilepticus: Secondary | ICD-10-CM | POA: Diagnosis not present

## 2012-01-22 DIAGNOSIS — I10 Essential (primary) hypertension: Secondary | ICD-10-CM | POA: Diagnosis not present

## 2012-01-22 DIAGNOSIS — K5900 Constipation, unspecified: Secondary | ICD-10-CM | POA: Diagnosis not present

## 2012-01-22 DIAGNOSIS — L8992 Pressure ulcer of unspecified site, stage 2: Secondary | ICD-10-CM | POA: Diagnosis not present

## 2012-01-22 DIAGNOSIS — Q039 Congenital hydrocephalus, unspecified: Secondary | ICD-10-CM | POA: Diagnosis not present

## 2012-01-22 DIAGNOSIS — L89209 Pressure ulcer of unspecified hip, unspecified stage: Secondary | ICD-10-CM | POA: Diagnosis not present

## 2012-01-26 ENCOUNTER — Telehealth: Payer: Self-pay | Admitting: Family Medicine

## 2012-01-26 DIAGNOSIS — G40909 Epilepsy, unspecified, not intractable, without status epilepticus: Secondary | ICD-10-CM | POA: Diagnosis not present

## 2012-01-26 DIAGNOSIS — Q039 Congenital hydrocephalus, unspecified: Secondary | ICD-10-CM | POA: Diagnosis not present

## 2012-01-26 DIAGNOSIS — L8992 Pressure ulcer of unspecified site, stage 2: Secondary | ICD-10-CM

## 2012-01-26 DIAGNOSIS — L89209 Pressure ulcer of unspecified hip, unspecified stage: Secondary | ICD-10-CM | POA: Diagnosis not present

## 2012-01-26 NOTE — Telephone Encounter (Signed)
Called [place]. She didn't know who had called or what it was pertaining to. Will try to find out and call back

## 2012-01-29 DIAGNOSIS — L89209 Pressure ulcer of unspecified hip, unspecified stage: Secondary | ICD-10-CM | POA: Diagnosis not present

## 2012-01-29 DIAGNOSIS — L8992 Pressure ulcer of unspecified site, stage 2: Secondary | ICD-10-CM | POA: Diagnosis not present

## 2012-01-29 DIAGNOSIS — Q039 Congenital hydrocephalus, unspecified: Secondary | ICD-10-CM | POA: Diagnosis not present

## 2012-01-29 DIAGNOSIS — K5900 Constipation, unspecified: Secondary | ICD-10-CM | POA: Diagnosis not present

## 2012-01-29 DIAGNOSIS — G40909 Epilepsy, unspecified, not intractable, without status epilepticus: Secondary | ICD-10-CM | POA: Diagnosis not present

## 2012-01-29 DIAGNOSIS — I10 Essential (primary) hypertension: Secondary | ICD-10-CM | POA: Diagnosis not present

## 2012-02-03 DIAGNOSIS — I10 Essential (primary) hypertension: Secondary | ICD-10-CM | POA: Diagnosis not present

## 2012-02-03 DIAGNOSIS — G40909 Epilepsy, unspecified, not intractable, without status epilepticus: Secondary | ICD-10-CM | POA: Diagnosis not present

## 2012-02-03 DIAGNOSIS — L89209 Pressure ulcer of unspecified hip, unspecified stage: Secondary | ICD-10-CM | POA: Diagnosis not present

## 2012-02-03 DIAGNOSIS — Q039 Congenital hydrocephalus, unspecified: Secondary | ICD-10-CM | POA: Diagnosis not present

## 2012-02-03 DIAGNOSIS — L8992 Pressure ulcer of unspecified site, stage 2: Secondary | ICD-10-CM | POA: Diagnosis not present

## 2012-02-03 DIAGNOSIS — K5900 Constipation, unspecified: Secondary | ICD-10-CM | POA: Diagnosis not present

## 2012-02-04 ENCOUNTER — Other Ambulatory Visit: Payer: Self-pay | Admitting: Family Medicine

## 2012-02-04 ENCOUNTER — Telehealth: Payer: Self-pay

## 2012-02-04 DIAGNOSIS — IMO0002 Reserved for concepts with insufficient information to code with codable children: Secondary | ICD-10-CM

## 2012-02-04 NOTE — Telephone Encounter (Signed)
pls let Cammy Brochure, pt's sister know and the nurse who has been caring that she is being referred to Dr Tanda Rockers , [facility] , wound center,I am entering the referral

## 2012-02-05 NOTE — Telephone Encounter (Signed)
Sister as well as Lyla Son with [facility] notified.

## 2012-02-18 ENCOUNTER — Other Ambulatory Visit: Payer: Self-pay | Admitting: Family Medicine

## 2012-02-18 ENCOUNTER — Other Ambulatory Visit: Payer: Self-pay

## 2012-02-18 DIAGNOSIS — Z79899 Other long term (current) drug therapy: Secondary | ICD-10-CM | POA: Diagnosis not present

## 2012-02-18 DIAGNOSIS — L8994 Pressure ulcer of unspecified site, stage 4: Secondary | ICD-10-CM | POA: Diagnosis not present

## 2012-02-18 DIAGNOSIS — E7800 Pure hypercholesterolemia, unspecified: Secondary | ICD-10-CM | POA: Diagnosis not present

## 2012-02-18 DIAGNOSIS — G911 Obstructive hydrocephalus: Secondary | ICD-10-CM | POA: Diagnosis not present

## 2012-02-18 DIAGNOSIS — G8220 Paraplegia, unspecified: Secondary | ICD-10-CM | POA: Diagnosis not present

## 2012-02-18 DIAGNOSIS — I10 Essential (primary) hypertension: Secondary | ICD-10-CM | POA: Diagnosis not present

## 2012-02-18 DIAGNOSIS — G40909 Epilepsy, unspecified, not intractable, without status epilepticus: Secondary | ICD-10-CM | POA: Diagnosis not present

## 2012-02-18 DIAGNOSIS — Q079 Congenital malformation of nervous system, unspecified: Secondary | ICD-10-CM | POA: Diagnosis not present

## 2012-02-18 DIAGNOSIS — L89209 Pressure ulcer of unspecified hip, unspecified stage: Secondary | ICD-10-CM | POA: Diagnosis not present

## 2012-02-18 MED ORDER — PHENOBARBITAL 32.4 MG PO TABS: ORAL_TABLET | ORAL | Status: DC

## 2012-02-19 DIAGNOSIS — L8993 Pressure ulcer of unspecified site, stage 3: Secondary | ICD-10-CM | POA: Diagnosis not present

## 2012-02-19 DIAGNOSIS — L89209 Pressure ulcer of unspecified hip, unspecified stage: Secondary | ICD-10-CM | POA: Diagnosis not present

## 2012-02-19 DIAGNOSIS — Z7401 Bed confinement status: Secondary | ICD-10-CM | POA: Diagnosis not present

## 2012-02-22 DIAGNOSIS — L89209 Pressure ulcer of unspecified hip, unspecified stage: Secondary | ICD-10-CM | POA: Diagnosis not present

## 2012-02-22 DIAGNOSIS — L8993 Pressure ulcer of unspecified site, stage 3: Secondary | ICD-10-CM | POA: Diagnosis not present

## 2012-02-22 DIAGNOSIS — Z7401 Bed confinement status: Secondary | ICD-10-CM | POA: Diagnosis not present

## 2012-02-24 DIAGNOSIS — Z7401 Bed confinement status: Secondary | ICD-10-CM | POA: Diagnosis not present

## 2012-02-24 DIAGNOSIS — L8993 Pressure ulcer of unspecified site, stage 3: Secondary | ICD-10-CM | POA: Diagnosis not present

## 2012-02-24 DIAGNOSIS — L89209 Pressure ulcer of unspecified hip, unspecified stage: Secondary | ICD-10-CM | POA: Diagnosis not present

## 2012-02-26 DIAGNOSIS — L89209 Pressure ulcer of unspecified hip, unspecified stage: Secondary | ICD-10-CM | POA: Diagnosis not present

## 2012-02-26 DIAGNOSIS — L8993 Pressure ulcer of unspecified site, stage 3: Secondary | ICD-10-CM | POA: Diagnosis not present

## 2012-02-26 DIAGNOSIS — Z7401 Bed confinement status: Secondary | ICD-10-CM | POA: Diagnosis not present

## 2012-02-29 DIAGNOSIS — Z7401 Bed confinement status: Secondary | ICD-10-CM | POA: Diagnosis not present

## 2012-02-29 DIAGNOSIS — L89209 Pressure ulcer of unspecified hip, unspecified stage: Secondary | ICD-10-CM | POA: Diagnosis not present

## 2012-02-29 DIAGNOSIS — L8993 Pressure ulcer of unspecified site, stage 3: Secondary | ICD-10-CM | POA: Diagnosis not present

## 2012-03-02 DIAGNOSIS — Z7401 Bed confinement status: Secondary | ICD-10-CM | POA: Diagnosis not present

## 2012-03-02 DIAGNOSIS — L8993 Pressure ulcer of unspecified site, stage 3: Secondary | ICD-10-CM | POA: Diagnosis not present

## 2012-03-02 DIAGNOSIS — L89209 Pressure ulcer of unspecified hip, unspecified stage: Secondary | ICD-10-CM | POA: Diagnosis not present

## 2012-03-05 DIAGNOSIS — Z7401 Bed confinement status: Secondary | ICD-10-CM | POA: Diagnosis not present

## 2012-03-05 DIAGNOSIS — L89209 Pressure ulcer of unspecified hip, unspecified stage: Secondary | ICD-10-CM | POA: Diagnosis not present

## 2012-03-05 DIAGNOSIS — L8993 Pressure ulcer of unspecified site, stage 3: Secondary | ICD-10-CM | POA: Diagnosis not present

## 2012-03-07 DIAGNOSIS — L89209 Pressure ulcer of unspecified hip, unspecified stage: Secondary | ICD-10-CM | POA: Diagnosis not present

## 2012-03-07 DIAGNOSIS — L8993 Pressure ulcer of unspecified site, stage 3: Secondary | ICD-10-CM | POA: Diagnosis not present

## 2012-03-07 DIAGNOSIS — Z7401 Bed confinement status: Secondary | ICD-10-CM | POA: Diagnosis not present

## 2012-03-09 DIAGNOSIS — L8994 Pressure ulcer of unspecified site, stage 4: Secondary | ICD-10-CM | POA: Diagnosis not present

## 2012-03-09 DIAGNOSIS — L89209 Pressure ulcer of unspecified hip, unspecified stage: Secondary | ICD-10-CM | POA: Diagnosis not present

## 2012-03-11 DIAGNOSIS — L89209 Pressure ulcer of unspecified hip, unspecified stage: Secondary | ICD-10-CM | POA: Diagnosis not present

## 2012-03-14 DIAGNOSIS — L8993 Pressure ulcer of unspecified site, stage 3: Secondary | ICD-10-CM | POA: Diagnosis not present

## 2012-03-14 DIAGNOSIS — L89209 Pressure ulcer of unspecified hip, unspecified stage: Secondary | ICD-10-CM | POA: Diagnosis not present

## 2012-03-14 DIAGNOSIS — Z7401 Bed confinement status: Secondary | ICD-10-CM | POA: Diagnosis not present

## 2012-03-16 DIAGNOSIS — Z7401 Bed confinement status: Secondary | ICD-10-CM | POA: Diagnosis not present

## 2012-03-16 DIAGNOSIS — L89209 Pressure ulcer of unspecified hip, unspecified stage: Secondary | ICD-10-CM | POA: Diagnosis not present

## 2012-03-18 DIAGNOSIS — L89209 Pressure ulcer of unspecified hip, unspecified stage: Secondary | ICD-10-CM | POA: Diagnosis not present

## 2012-03-21 DIAGNOSIS — L8993 Pressure ulcer of unspecified site, stage 3: Secondary | ICD-10-CM | POA: Diagnosis not present

## 2012-03-21 DIAGNOSIS — L89209 Pressure ulcer of unspecified hip, unspecified stage: Secondary | ICD-10-CM | POA: Diagnosis not present

## 2012-03-23 DIAGNOSIS — L8993 Pressure ulcer of unspecified site, stage 3: Secondary | ICD-10-CM | POA: Diagnosis not present

## 2012-03-26 DIAGNOSIS — Z7401 Bed confinement status: Secondary | ICD-10-CM | POA: Diagnosis not present

## 2012-03-26 DIAGNOSIS — L8993 Pressure ulcer of unspecified site, stage 3: Secondary | ICD-10-CM | POA: Diagnosis not present

## 2012-03-26 DIAGNOSIS — L89209 Pressure ulcer of unspecified hip, unspecified stage: Secondary | ICD-10-CM | POA: Diagnosis not present

## 2012-03-28 DIAGNOSIS — L8993 Pressure ulcer of unspecified site, stage 3: Secondary | ICD-10-CM | POA: Diagnosis not present

## 2012-03-28 DIAGNOSIS — Z7401 Bed confinement status: Secondary | ICD-10-CM | POA: Diagnosis not present

## 2012-03-30 DIAGNOSIS — L8993 Pressure ulcer of unspecified site, stage 3: Secondary | ICD-10-CM | POA: Diagnosis not present

## 2012-04-01 DIAGNOSIS — L8993 Pressure ulcer of unspecified site, stage 3: Secondary | ICD-10-CM | POA: Diagnosis not present

## 2012-04-05 DIAGNOSIS — L89209 Pressure ulcer of unspecified hip, unspecified stage: Secondary | ICD-10-CM | POA: Diagnosis not present

## 2012-04-06 DIAGNOSIS — L8994 Pressure ulcer of unspecified site, stage 4: Secondary | ICD-10-CM | POA: Diagnosis not present

## 2012-04-06 DIAGNOSIS — L89209 Pressure ulcer of unspecified hip, unspecified stage: Secondary | ICD-10-CM | POA: Diagnosis not present

## 2012-04-08 DIAGNOSIS — L89209 Pressure ulcer of unspecified hip, unspecified stage: Secondary | ICD-10-CM | POA: Diagnosis not present

## 2012-04-08 DIAGNOSIS — Z7401 Bed confinement status: Secondary | ICD-10-CM | POA: Diagnosis not present

## 2012-04-08 DIAGNOSIS — L8993 Pressure ulcer of unspecified site, stage 3: Secondary | ICD-10-CM | POA: Diagnosis not present

## 2012-04-11 DIAGNOSIS — L8993 Pressure ulcer of unspecified site, stage 3: Secondary | ICD-10-CM | POA: Diagnosis not present

## 2012-04-11 DIAGNOSIS — L89209 Pressure ulcer of unspecified hip, unspecified stage: Secondary | ICD-10-CM | POA: Diagnosis not present

## 2012-04-11 DIAGNOSIS — Z7401 Bed confinement status: Secondary | ICD-10-CM | POA: Diagnosis not present

## 2012-04-13 DIAGNOSIS — L89209 Pressure ulcer of unspecified hip, unspecified stage: Secondary | ICD-10-CM | POA: Diagnosis not present

## 2012-04-13 DIAGNOSIS — L8993 Pressure ulcer of unspecified site, stage 3: Secondary | ICD-10-CM | POA: Diagnosis not present

## 2012-04-13 DIAGNOSIS — Z7401 Bed confinement status: Secondary | ICD-10-CM | POA: Diagnosis not present

## 2012-04-18 DIAGNOSIS — L89209 Pressure ulcer of unspecified hip, unspecified stage: Secondary | ICD-10-CM | POA: Diagnosis not present

## 2012-04-18 DIAGNOSIS — Z7401 Bed confinement status: Secondary | ICD-10-CM | POA: Diagnosis not present

## 2012-04-18 DIAGNOSIS — L8993 Pressure ulcer of unspecified site, stage 3: Secondary | ICD-10-CM | POA: Diagnosis not present

## 2012-04-19 DIAGNOSIS — R32 Unspecified urinary incontinence: Secondary | ICD-10-CM | POA: Diagnosis not present

## 2012-04-19 DIAGNOSIS — L8993 Pressure ulcer of unspecified site, stage 3: Secondary | ICD-10-CM | POA: Diagnosis not present

## 2012-04-19 DIAGNOSIS — L89209 Pressure ulcer of unspecified hip, unspecified stage: Secondary | ICD-10-CM | POA: Diagnosis not present

## 2012-04-19 DIAGNOSIS — R159 Full incontinence of feces: Secondary | ICD-10-CM | POA: Diagnosis not present

## 2012-04-20 DIAGNOSIS — L98499 Non-pressure chronic ulcer of skin of other sites with unspecified severity: Secondary | ICD-10-CM | POA: Diagnosis not present

## 2012-04-20 DIAGNOSIS — IMO0002 Reserved for concepts with insufficient information to code with codable children: Secondary | ICD-10-CM | POA: Diagnosis not present

## 2012-04-22 DIAGNOSIS — R159 Full incontinence of feces: Secondary | ICD-10-CM | POA: Diagnosis not present

## 2012-04-22 DIAGNOSIS — R32 Unspecified urinary incontinence: Secondary | ICD-10-CM | POA: Diagnosis not present

## 2012-04-22 DIAGNOSIS — L8993 Pressure ulcer of unspecified site, stage 3: Secondary | ICD-10-CM | POA: Diagnosis not present

## 2012-04-22 DIAGNOSIS — L89209 Pressure ulcer of unspecified hip, unspecified stage: Secondary | ICD-10-CM | POA: Diagnosis not present

## 2012-04-25 DIAGNOSIS — R159 Full incontinence of feces: Secondary | ICD-10-CM | POA: Diagnosis not present

## 2012-04-25 DIAGNOSIS — R32 Unspecified urinary incontinence: Secondary | ICD-10-CM | POA: Diagnosis not present

## 2012-04-25 DIAGNOSIS — L89209 Pressure ulcer of unspecified hip, unspecified stage: Secondary | ICD-10-CM | POA: Diagnosis not present

## 2012-04-25 DIAGNOSIS — L8993 Pressure ulcer of unspecified site, stage 3: Secondary | ICD-10-CM | POA: Diagnosis not present

## 2012-04-27 ENCOUNTER — Other Ambulatory Visit: Payer: Self-pay

## 2012-04-27 MED ORDER — POLYETHYLENE GLYCOL 3350 17 GM/SCOOP PO POWD: 17.0000 g | Freq: Every day | ORAL | Status: DC

## 2012-05-02 DIAGNOSIS — L02419 Cutaneous abscess of limb, unspecified: Secondary | ICD-10-CM | POA: Diagnosis not present

## 2012-05-02 DIAGNOSIS — L98499 Non-pressure chronic ulcer of skin of other sites with unspecified severity: Secondary | ICD-10-CM | POA: Diagnosis not present

## 2012-05-02 DIAGNOSIS — L03119 Cellulitis of unspecified part of limb: Secondary | ICD-10-CM | POA: Diagnosis not present

## 2012-05-02 DIAGNOSIS — IMO0002 Reserved for concepts with insufficient information to code with codable children: Secondary | ICD-10-CM | POA: Diagnosis not present

## 2012-05-04 DIAGNOSIS — L89209 Pressure ulcer of unspecified hip, unspecified stage: Secondary | ICD-10-CM | POA: Diagnosis not present

## 2012-05-04 DIAGNOSIS — R32 Unspecified urinary incontinence: Secondary | ICD-10-CM | POA: Diagnosis not present

## 2012-05-04 DIAGNOSIS — R159 Full incontinence of feces: Secondary | ICD-10-CM | POA: Diagnosis not present

## 2012-05-04 DIAGNOSIS — L8993 Pressure ulcer of unspecified site, stage 3: Secondary | ICD-10-CM | POA: Diagnosis not present

## 2012-05-11 DIAGNOSIS — L89209 Pressure ulcer of unspecified hip, unspecified stage: Secondary | ICD-10-CM | POA: Diagnosis not present

## 2012-05-11 DIAGNOSIS — L8993 Pressure ulcer of unspecified site, stage 3: Secondary | ICD-10-CM | POA: Diagnosis not present

## 2012-05-11 DIAGNOSIS — R159 Full incontinence of feces: Secondary | ICD-10-CM | POA: Diagnosis not present

## 2012-05-11 DIAGNOSIS — R32 Unspecified urinary incontinence: Secondary | ICD-10-CM | POA: Diagnosis not present

## 2012-05-12 ENCOUNTER — Telehealth: Payer: Self-pay | Admitting: Family Medicine

## 2012-05-12 DIAGNOSIS — E785 Hyperlipidemia, unspecified: Secondary | ICD-10-CM

## 2012-05-12 DIAGNOSIS — I10 Essential (primary) hypertension: Secondary | ICD-10-CM

## 2012-05-12 DIAGNOSIS — E119 Type 2 diabetes mellitus without complications: Secondary | ICD-10-CM

## 2012-05-12 NOTE — Telephone Encounter (Signed)
Labs ordered and faxed to [facility]  

## 2012-05-16 ENCOUNTER — Encounter: Payer: Self-pay | Admitting: Family Medicine

## 2012-05-16 ENCOUNTER — Ambulatory Visit (INDEPENDENT_AMBULATORY_CARE_PROVIDER_SITE_OTHER): Payer: Medicare Other | Admitting: Family Medicine

## 2012-05-16 VITALS — BP 122/68 | HR 60 | Resp 18

## 2012-05-16 DIAGNOSIS — J309 Allergic rhinitis, unspecified: Secondary | ICD-10-CM | POA: Diagnosis not present

## 2012-05-16 DIAGNOSIS — R569 Unspecified convulsions: Secondary | ICD-10-CM

## 2012-05-16 DIAGNOSIS — I10 Essential (primary) hypertension: Secondary | ICD-10-CM | POA: Diagnosis not present

## 2012-05-16 DIAGNOSIS — E785 Hyperlipidemia, unspecified: Secondary | ICD-10-CM

## 2012-05-16 DIAGNOSIS — H6120 Impacted cerumen, unspecified ear: Secondary | ICD-10-CM

## 2012-05-16 DIAGNOSIS — E119 Type 2 diabetes mellitus without complications: Secondary | ICD-10-CM

## 2012-05-16 DIAGNOSIS — J302 Other seasonal allergic rhinitis: Secondary | ICD-10-CM | POA: Insufficient documentation

## 2012-05-16 DIAGNOSIS — H6123 Impacted cerumen, bilateral: Secondary | ICD-10-CM

## 2012-05-16 LAB — BASIC METABOLIC PANEL
Chloride: 94 mEq/L — ABNORMAL LOW (ref 96–112)
Glucose, Bld: 83 mg/dL (ref 70–99)
Potassium: 4.6 mEq/L (ref 3.5–5.3)
Sodium: 131 mEq/L — ABNORMAL LOW (ref 135–145)

## 2012-05-16 LAB — HEMOGLOBIN A1C
Hgb A1c MFr Bld: 5.6 % (ref <5.7)
Mean Plasma Glucose: 114 mg/dL (ref <117)

## 2012-05-16 LAB — CBC WITH DIFFERENTIAL/PLATELET
Basophils Absolute: 0 10*3/uL (ref 0.0–0.1)
Basophils Relative: 1 % (ref 0–1)
Hemoglobin: 13.7 g/dL (ref 12.0–15.0)
Lymphocytes Relative: 37 % (ref 12–46)
MCHC: 34.6 g/dL (ref 30.0–36.0)
Monocytes Relative: 10 % (ref 3–12)
Neutro Abs: 3.7 10*3/uL (ref 1.7–7.7)
Neutrophils Relative %: 50 % (ref 43–77)
WBC: 7.2 10*3/uL (ref 4.0–10.5)

## 2012-05-16 LAB — LIPID PANEL
Cholesterol: 158 mg/dL (ref 0–200)
Total CHOL/HDL Ratio: 3.6 Ratio

## 2012-05-16 MED ORDER — ZOCOR 10 MG PO TABS: ORAL_TABLET | ORAL | Status: DC

## 2012-05-16 MED ORDER — RANITIDINE HCL 150 MG PO TABS: 150.0000 mg | ORAL_TABLET | Freq: Every day | ORAL | Status: DC

## 2012-05-16 MED ORDER — PHENOBARBITAL 32.4 MG PO TABS: ORAL_TABLET | ORAL | Status: DC

## 2012-05-16 MED ORDER — BISOPROLOL-HYDROCHLOROTHIAZIDE 5-6.25 MG PO TABS: 1.0000 | ORAL_TABLET | Freq: Every day | ORAL | Status: DC

## 2012-05-16 MED ORDER — DOCUSATE SODIUM 100 MG PO CAPS: ORAL_CAPSULE | ORAL | Status: DC

## 2012-05-16 MED ORDER — POTASSIUM CHLORIDE CRYS ER 10 MEQ PO TBCR: EXTENDED_RELEASE_TABLET | ORAL | Status: DC

## 2012-05-16 MED ORDER — MULTI-VITS/FLUORIDE 0.25 MG PO CHEW: 1.0000 | CHEWABLE_TABLET | Freq: Every day | ORAL | Status: AC

## 2012-05-16 NOTE — Assessment & Plan Note (Signed)
Hyperlipidemia:Low fat diet discussed and encouraged. Updated lab shows good control

## 2012-05-16 NOTE — Progress Notes (Signed)
  Subjective:    Patient ID: April Watson, female    DOB: Jun 07, 1960, 52 y.o.   MRN: [id number]  HPI Pt brought in by family fo routine follow up. He has a right hip ulcer which is being followed by vascular surgery for at least 2.5 month, healing is slow, but she is getting weelyl checks by home health nurse and monthly by supervising Doc. No fever, wound is closing gradually, daily irrigation and packing by family. Notes intermittent runny nose and cough, no fever, drainage is clear, appetite good, bowel movements and urine are both good, has alternate day BM Sleep is good   Review of Systems See HPI, history from family Denies recent fever or chills. Denies sinus pressure, nasal congestion, ear pain or sore throat. Denies chest congestion, productive cough or wheezing. Denies chest pains, palpitations and leg swelling Denies abdominal pain, nausea, vomiting,diarrhea or constipation.   Denies dysuria, frequency,malodorous urine. Chronic limitation in mobility, wheelchair depenedent Denies headaches, seizures, numbness, or tingling. Denies depression, anxiety or insomnia. enies skin break down or rash.        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  Neck decreased ROM no adenopathy.Bilateral cerumen impaction, moderate  Chest: Clear to auscultation bilaterally.  CVS: S1, S2 no murmurs, no S3.  ABD: Soft non tender. Bowel sounds normal.  Ext: No edema  MS: decreased ROM spine, shoulders, hips and knees.  Skin: no breakdown noted, however has left hip ulcer .  Psych: Good eye contact  CNS: CN 2-12 intact,decreased power and tone in all extremities        Assessment & Plan:

## 2012-05-16 NOTE — Assessment & Plan Note (Signed)
Bilateral ear irrigation, moderate cerumen removed, no visible trauma

## 2012-05-16 NOTE — Patient Instructions (Addendum)
Annual wellness in end October  We will contact you with lab results.  Stool cards will be provided to be brought back in   Pls continue appts re skin and watch skin closely  New multivitamin has been sent in, pls contact if cost or other is an issue re alternatives.  Glucerna would be the recommended, supplement, if she needs one.

## 2012-05-16 NOTE — Assessment & Plan Note (Signed)
No seizures noted for years  contine med

## 2012-05-16 NOTE — Assessment & Plan Note (Signed)
Updated lab today, diet controlled only a this time

## 2012-05-16 NOTE — Assessment & Plan Note (Signed)
Controlled, no change in medication DASH diet and commitment to daily physical activity for a minimum of 30 minutes discussed and encouraged, as a part of hypertension management. The importance of attaining a healthy weight is also discussed.  

## 2012-05-17 DIAGNOSIS — L8993 Pressure ulcer of unspecified site, stage 3: Secondary | ICD-10-CM | POA: Diagnosis not present

## 2012-05-17 DIAGNOSIS — R32 Unspecified urinary incontinence: Secondary | ICD-10-CM | POA: Diagnosis not present

## 2012-05-17 DIAGNOSIS — R159 Full incontinence of feces: Secondary | ICD-10-CM | POA: Diagnosis not present

## 2012-05-17 DIAGNOSIS — L89209 Pressure ulcer of unspecified hip, unspecified stage: Secondary | ICD-10-CM | POA: Diagnosis not present

## 2012-05-24 DIAGNOSIS — L89209 Pressure ulcer of unspecified hip, unspecified stage: Secondary | ICD-10-CM | POA: Diagnosis not present

## 2012-05-24 DIAGNOSIS — R32 Unspecified urinary incontinence: Secondary | ICD-10-CM | POA: Diagnosis not present

## 2012-05-24 DIAGNOSIS — L8993 Pressure ulcer of unspecified site, stage 3: Secondary | ICD-10-CM | POA: Diagnosis not present

## 2012-05-24 DIAGNOSIS — R159 Full incontinence of feces: Secondary | ICD-10-CM | POA: Diagnosis not present

## 2012-05-31 DIAGNOSIS — IMO0002 Reserved for concepts with insufficient information to code with codable children: Secondary | ICD-10-CM | POA: Diagnosis not present

## 2012-05-31 DIAGNOSIS — L98499 Non-pressure chronic ulcer of skin of other sites with unspecified severity: Secondary | ICD-10-CM | POA: Diagnosis not present

## 2012-06-01 DIAGNOSIS — L89209 Pressure ulcer of unspecified hip, unspecified stage: Secondary | ICD-10-CM | POA: Diagnosis not present

## 2012-06-01 DIAGNOSIS — L8993 Pressure ulcer of unspecified site, stage 3: Secondary | ICD-10-CM | POA: Diagnosis not present

## 2012-06-01 DIAGNOSIS — R159 Full incontinence of feces: Secondary | ICD-10-CM | POA: Diagnosis not present

## 2012-06-01 DIAGNOSIS — R32 Unspecified urinary incontinence: Secondary | ICD-10-CM | POA: Diagnosis not present

## 2012-06-07 DIAGNOSIS — L8993 Pressure ulcer of unspecified site, stage 3: Secondary | ICD-10-CM | POA: Diagnosis not present

## 2012-06-07 DIAGNOSIS — R159 Full incontinence of feces: Secondary | ICD-10-CM | POA: Diagnosis not present

## 2012-06-07 DIAGNOSIS — L89209 Pressure ulcer of unspecified hip, unspecified stage: Secondary | ICD-10-CM | POA: Diagnosis not present

## 2012-06-07 DIAGNOSIS — R32 Unspecified urinary incontinence: Secondary | ICD-10-CM | POA: Diagnosis not present

## 2012-06-14 DIAGNOSIS — R32 Unspecified urinary incontinence: Secondary | ICD-10-CM | POA: Diagnosis not present

## 2012-06-14 DIAGNOSIS — R159 Full incontinence of feces: Secondary | ICD-10-CM | POA: Diagnosis not present

## 2012-06-14 DIAGNOSIS — L8993 Pressure ulcer of unspecified site, stage 3: Secondary | ICD-10-CM | POA: Diagnosis not present

## 2012-06-14 DIAGNOSIS — L89209 Pressure ulcer of unspecified hip, unspecified stage: Secondary | ICD-10-CM | POA: Diagnosis not present

## 2012-06-18 DIAGNOSIS — R159 Full incontinence of feces: Secondary | ICD-10-CM | POA: Diagnosis not present

## 2012-06-18 DIAGNOSIS — L89209 Pressure ulcer of unspecified hip, unspecified stage: Secondary | ICD-10-CM | POA: Diagnosis not present

## 2012-06-18 DIAGNOSIS — L8993 Pressure ulcer of unspecified site, stage 3: Secondary | ICD-10-CM | POA: Diagnosis not present

## 2012-06-18 DIAGNOSIS — R32 Unspecified urinary incontinence: Secondary | ICD-10-CM | POA: Diagnosis not present

## 2012-06-21 DIAGNOSIS — L89209 Pressure ulcer of unspecified hip, unspecified stage: Secondary | ICD-10-CM | POA: Diagnosis not present

## 2012-06-21 DIAGNOSIS — R32 Unspecified urinary incontinence: Secondary | ICD-10-CM | POA: Diagnosis not present

## 2012-06-21 DIAGNOSIS — L8993 Pressure ulcer of unspecified site, stage 3: Secondary | ICD-10-CM | POA: Diagnosis not present

## 2012-06-21 DIAGNOSIS — R159 Full incontinence of feces: Secondary | ICD-10-CM | POA: Diagnosis not present

## 2012-06-22 DIAGNOSIS — L8993 Pressure ulcer of unspecified site, stage 3: Secondary | ICD-10-CM | POA: Diagnosis not present

## 2012-06-22 DIAGNOSIS — R159 Full incontinence of feces: Secondary | ICD-10-CM | POA: Diagnosis not present

## 2012-06-22 DIAGNOSIS — L89209 Pressure ulcer of unspecified hip, unspecified stage: Secondary | ICD-10-CM | POA: Diagnosis not present

## 2012-06-22 DIAGNOSIS — R32 Unspecified urinary incontinence: Secondary | ICD-10-CM | POA: Diagnosis not present

## 2012-06-28 DIAGNOSIS — R32 Unspecified urinary incontinence: Secondary | ICD-10-CM | POA: Diagnosis not present

## 2012-06-28 DIAGNOSIS — L89209 Pressure ulcer of unspecified hip, unspecified stage: Secondary | ICD-10-CM | POA: Diagnosis not present

## 2012-06-28 DIAGNOSIS — R159 Full incontinence of feces: Secondary | ICD-10-CM | POA: Diagnosis not present

## 2012-06-28 DIAGNOSIS — L8993 Pressure ulcer of unspecified site, stage 3: Secondary | ICD-10-CM | POA: Diagnosis not present

## 2012-06-30 DIAGNOSIS — L8990 Pressure ulcer of unspecified site, unspecified stage: Secondary | ICD-10-CM | POA: Diagnosis not present

## 2012-06-30 DIAGNOSIS — L8994 Pressure ulcer of unspecified site, stage 4: Secondary | ICD-10-CM | POA: Diagnosis not present

## 2012-06-30 DIAGNOSIS — L89209 Pressure ulcer of unspecified hip, unspecified stage: Secondary | ICD-10-CM | POA: Diagnosis not present

## 2012-06-30 DIAGNOSIS — L03119 Cellulitis of unspecified part of limb: Secondary | ICD-10-CM | POA: Diagnosis not present

## 2012-06-30 DIAGNOSIS — L02419 Cutaneous abscess of limb, unspecified: Secondary | ICD-10-CM | POA: Diagnosis not present

## 2012-06-30 DIAGNOSIS — IMO0002 Reserved for concepts with insufficient information to code with codable children: Secondary | ICD-10-CM | POA: Diagnosis not present

## 2012-07-05 DIAGNOSIS — L89209 Pressure ulcer of unspecified hip, unspecified stage: Secondary | ICD-10-CM | POA: Diagnosis not present

## 2012-07-05 DIAGNOSIS — R32 Unspecified urinary incontinence: Secondary | ICD-10-CM | POA: Diagnosis not present

## 2012-07-05 DIAGNOSIS — R159 Full incontinence of feces: Secondary | ICD-10-CM | POA: Diagnosis not present

## 2012-07-05 DIAGNOSIS — L8993 Pressure ulcer of unspecified site, stage 3: Secondary | ICD-10-CM | POA: Diagnosis not present

## 2012-07-13 DIAGNOSIS — L8993 Pressure ulcer of unspecified site, stage 3: Secondary | ICD-10-CM | POA: Diagnosis not present

## 2012-07-13 DIAGNOSIS — L89209 Pressure ulcer of unspecified hip, unspecified stage: Secondary | ICD-10-CM | POA: Diagnosis not present

## 2012-07-13 DIAGNOSIS — R32 Unspecified urinary incontinence: Secondary | ICD-10-CM | POA: Diagnosis not present

## 2012-07-13 DIAGNOSIS — R159 Full incontinence of feces: Secondary | ICD-10-CM | POA: Diagnosis not present

## 2012-07-20 DIAGNOSIS — L8993 Pressure ulcer of unspecified site, stage 3: Secondary | ICD-10-CM | POA: Diagnosis not present

## 2012-07-20 DIAGNOSIS — R32 Unspecified urinary incontinence: Secondary | ICD-10-CM | POA: Diagnosis not present

## 2012-07-20 DIAGNOSIS — L89209 Pressure ulcer of unspecified hip, unspecified stage: Secondary | ICD-10-CM | POA: Diagnosis not present

## 2012-07-20 DIAGNOSIS — R159 Full incontinence of feces: Secondary | ICD-10-CM | POA: Diagnosis not present

## 2012-07-26 DIAGNOSIS — L8993 Pressure ulcer of unspecified site, stage 3: Secondary | ICD-10-CM | POA: Diagnosis not present

## 2012-07-26 DIAGNOSIS — R32 Unspecified urinary incontinence: Secondary | ICD-10-CM | POA: Diagnosis not present

## 2012-07-26 DIAGNOSIS — L89209 Pressure ulcer of unspecified hip, unspecified stage: Secondary | ICD-10-CM | POA: Diagnosis not present

## 2012-07-26 DIAGNOSIS — R159 Full incontinence of feces: Secondary | ICD-10-CM | POA: Diagnosis not present

## 2012-07-28 DIAGNOSIS — L98499 Non-pressure chronic ulcer of skin of other sites with unspecified severity: Secondary | ICD-10-CM | POA: Diagnosis not present

## 2012-07-28 DIAGNOSIS — L8994 Pressure ulcer of unspecified site, stage 4: Secondary | ICD-10-CM | POA: Diagnosis not present

## 2012-07-28 DIAGNOSIS — L89209 Pressure ulcer of unspecified hip, unspecified stage: Secondary | ICD-10-CM | POA: Diagnosis not present

## 2012-08-02 DIAGNOSIS — R32 Unspecified urinary incontinence: Secondary | ICD-10-CM | POA: Diagnosis not present

## 2012-08-02 DIAGNOSIS — L8993 Pressure ulcer of unspecified site, stage 3: Secondary | ICD-10-CM | POA: Diagnosis not present

## 2012-08-02 DIAGNOSIS — R159 Full incontinence of feces: Secondary | ICD-10-CM | POA: Diagnosis not present

## 2012-08-02 DIAGNOSIS — L89209 Pressure ulcer of unspecified hip, unspecified stage: Secondary | ICD-10-CM | POA: Diagnosis not present

## 2012-08-10 DIAGNOSIS — R159 Full incontinence of feces: Secondary | ICD-10-CM | POA: Diagnosis not present

## 2012-08-10 DIAGNOSIS — R32 Unspecified urinary incontinence: Secondary | ICD-10-CM | POA: Diagnosis not present

## 2012-08-10 DIAGNOSIS — L8993 Pressure ulcer of unspecified site, stage 3: Secondary | ICD-10-CM | POA: Diagnosis not present

## 2012-08-10 DIAGNOSIS — L89209 Pressure ulcer of unspecified hip, unspecified stage: Secondary | ICD-10-CM | POA: Diagnosis not present

## 2012-08-16 DIAGNOSIS — L89209 Pressure ulcer of unspecified hip, unspecified stage: Secondary | ICD-10-CM | POA: Diagnosis not present

## 2012-08-16 DIAGNOSIS — L8993 Pressure ulcer of unspecified site, stage 3: Secondary | ICD-10-CM | POA: Diagnosis not present

## 2012-08-16 DIAGNOSIS — R159 Full incontinence of feces: Secondary | ICD-10-CM | POA: Diagnosis not present

## 2012-08-16 DIAGNOSIS — R32 Unspecified urinary incontinence: Secondary | ICD-10-CM | POA: Diagnosis not present

## 2012-08-17 DIAGNOSIS — L8993 Pressure ulcer of unspecified site, stage 3: Secondary | ICD-10-CM | POA: Diagnosis not present

## 2012-08-17 DIAGNOSIS — R32 Unspecified urinary incontinence: Secondary | ICD-10-CM | POA: Diagnosis not present

## 2012-08-17 DIAGNOSIS — L89209 Pressure ulcer of unspecified hip, unspecified stage: Secondary | ICD-10-CM | POA: Diagnosis not present

## 2012-08-17 DIAGNOSIS — R159 Full incontinence of feces: Secondary | ICD-10-CM | POA: Diagnosis not present

## 2012-08-23 DIAGNOSIS — L89209 Pressure ulcer of unspecified hip, unspecified stage: Secondary | ICD-10-CM | POA: Diagnosis not present

## 2012-08-23 DIAGNOSIS — R32 Unspecified urinary incontinence: Secondary | ICD-10-CM | POA: Diagnosis not present

## 2012-08-23 DIAGNOSIS — R159 Full incontinence of feces: Secondary | ICD-10-CM | POA: Diagnosis not present

## 2012-08-23 DIAGNOSIS — L8993 Pressure ulcer of unspecified site, stage 3: Secondary | ICD-10-CM | POA: Diagnosis not present

## 2012-08-30 DIAGNOSIS — R159 Full incontinence of feces: Secondary | ICD-10-CM | POA: Diagnosis not present

## 2012-08-30 DIAGNOSIS — L89209 Pressure ulcer of unspecified hip, unspecified stage: Secondary | ICD-10-CM | POA: Diagnosis not present

## 2012-08-30 DIAGNOSIS — L8993 Pressure ulcer of unspecified site, stage 3: Secondary | ICD-10-CM | POA: Diagnosis not present

## 2012-08-30 DIAGNOSIS — R32 Unspecified urinary incontinence: Secondary | ICD-10-CM | POA: Diagnosis not present

## 2012-08-31 DIAGNOSIS — L03119 Cellulitis of unspecified part of limb: Secondary | ICD-10-CM | POA: Diagnosis not present

## 2012-08-31 DIAGNOSIS — Z09 Encounter for follow-up examination after completed treatment for conditions other than malignant neoplasm: Secondary | ICD-10-CM | POA: Diagnosis not present

## 2012-09-05 DIAGNOSIS — R159 Full incontinence of feces: Secondary | ICD-10-CM | POA: Diagnosis not present

## 2012-09-05 DIAGNOSIS — L89209 Pressure ulcer of unspecified hip, unspecified stage: Secondary | ICD-10-CM | POA: Diagnosis not present

## 2012-09-05 DIAGNOSIS — R32 Unspecified urinary incontinence: Secondary | ICD-10-CM | POA: Diagnosis not present

## 2012-09-05 DIAGNOSIS — L8993 Pressure ulcer of unspecified site, stage 3: Secondary | ICD-10-CM | POA: Diagnosis not present

## 2012-09-14 DIAGNOSIS — L8993 Pressure ulcer of unspecified site, stage 3: Secondary | ICD-10-CM | POA: Diagnosis not present

## 2012-09-14 DIAGNOSIS — R159 Full incontinence of feces: Secondary | ICD-10-CM | POA: Diagnosis not present

## 2012-09-14 DIAGNOSIS — L89209 Pressure ulcer of unspecified hip, unspecified stage: Secondary | ICD-10-CM | POA: Diagnosis not present

## 2012-09-14 DIAGNOSIS — R32 Unspecified urinary incontinence: Secondary | ICD-10-CM | POA: Diagnosis not present

## 2012-09-20 DIAGNOSIS — L89209 Pressure ulcer of unspecified hip, unspecified stage: Secondary | ICD-10-CM | POA: Diagnosis not present

## 2012-09-20 DIAGNOSIS — R32 Unspecified urinary incontinence: Secondary | ICD-10-CM | POA: Diagnosis not present

## 2012-09-20 DIAGNOSIS — R159 Full incontinence of feces: Secondary | ICD-10-CM | POA: Diagnosis not present

## 2012-09-20 DIAGNOSIS — L8993 Pressure ulcer of unspecified site, stage 3: Secondary | ICD-10-CM | POA: Diagnosis not present

## 2012-10-05 DIAGNOSIS — R159 Full incontinence of feces: Secondary | ICD-10-CM | POA: Diagnosis not present

## 2012-10-05 DIAGNOSIS — L89209 Pressure ulcer of unspecified hip, unspecified stage: Secondary | ICD-10-CM | POA: Diagnosis not present

## 2012-10-05 DIAGNOSIS — L8993 Pressure ulcer of unspecified site, stage 3: Secondary | ICD-10-CM | POA: Diagnosis not present

## 2012-10-05 DIAGNOSIS — R32 Unspecified urinary incontinence: Secondary | ICD-10-CM | POA: Diagnosis not present

## 2012-10-17 ENCOUNTER — Ambulatory Visit (INDEPENDENT_AMBULATORY_CARE_PROVIDER_SITE_OTHER): Payer: Medicare Other

## 2012-10-17 DIAGNOSIS — Z23 Encounter for immunization: Secondary | ICD-10-CM

## 2012-11-08 NOTE — Addendum Note (Signed)
Addended by: Kandis Fantasia B on: 11/08/2012 02:47 PM   Modules accepted: Orders

## 2012-11-11 ENCOUNTER — Other Ambulatory Visit: Payer: Self-pay | Admitting: Family Medicine

## 2012-11-17 ENCOUNTER — Encounter (INDEPENDENT_AMBULATORY_CARE_PROVIDER_SITE_OTHER): Payer: Self-pay

## 2012-11-17 ENCOUNTER — Ambulatory Visit (INDEPENDENT_AMBULATORY_CARE_PROVIDER_SITE_OTHER): Payer: Medicare Other | Admitting: Family Medicine

## 2012-11-17 ENCOUNTER — Encounter: Payer: Self-pay | Admitting: Family Medicine

## 2012-11-17 VITALS — BP 124/84 | HR 62 | Resp 16

## 2012-11-17 DIAGNOSIS — Z0000 Encounter for general adult medical examination without abnormal findings: Secondary | ICD-10-CM

## 2012-11-17 DIAGNOSIS — I10 Essential (primary) hypertension: Secondary | ICD-10-CM | POA: Diagnosis not present

## 2012-11-17 DIAGNOSIS — R05 Cough: Secondary | ICD-10-CM

## 2012-11-17 DIAGNOSIS — E119 Type 2 diabetes mellitus without complications: Secondary | ICD-10-CM | POA: Diagnosis not present

## 2012-11-17 DIAGNOSIS — E785 Hyperlipidemia, unspecified: Secondary | ICD-10-CM | POA: Diagnosis not present

## 2012-11-17 MED ORDER — BENZONATATE 100 MG PO CAPS: 100.0000 mg | ORAL_CAPSULE | Freq: Three times a day (TID) | ORAL | Status: DC | PRN

## 2012-11-17 NOTE — Progress Notes (Signed)
Subjective:    Patient ID: April Watson, female    DOB: 1960-04-17, 52 y.o.   MRN: [id number]  HPI Preventive Screening-Counseling & Management   Patient present here today for a Medicare annual wellness visit.   Current Problems (verified)   Medications Prior to Visit Allergies (verified)   PAST HISTORY  Family History: 3 siblings, 2 with HTN  And diabetes, one with colon cancer  Social History : single, never married, has been inn te care of her parents, then  siblings since birth, due to cerebral palsy, never alcohol, nicotine or street drugs    Risk Factors  Current exercise habits:incapable    Dietary issues discussed:low fat, low sugar    Cardiac risk factors: none significant  Depression Screen  (Note: if answer to either of the following is "Yes", a more complete depression screening is indicated)   Over the past two weeks, have you felt down, depressed or hopeless? No change in mood noted, smiles and chuckles most of the time Over the past two weeks, have you felt little interest or pleasure in doing things? No change in behavior noted Have you lost interest or pleasure in daily life? Not applicable  Do you often feel hopeless? Not applicable  Do you cry easily over simple problems? No   Activities of Daily Living  In your present state of health, do you have any difficulty performing the following activities?  Driving?: incapable,has never been able to drive Managing money?:incapable Feeding yourself?:yes, need help Getting from bed to chair?:yes , unable without assistance Climbing a flight of stairs?:unable Preparing food and eating?:unable to prepare food, assistance needed with eating Bathing or showering?:yes Getting dressed?:yes Getting to the toilet?:yes  Using the toilet?uses incontinence pads/diapers, needs assistance with all ADL's Moving around from place to place?: yes unable to walk or stand independently  Fall Risk Assessment In the past  year have you fallen or had a near fall?:No, wheelchair dependent, lower extremity weakness, Are you currently taking any medications that make you dizzy?:sister unaware of this   Hearing Difficulties: No,  Do you often ask people to speak up or repeat themselves?:No apparent hearing loss Do you experience ringing or noises in your ears?:unknown Do you have difficulty understanding soft or whispered voices?:unknown  Cognitive Testing : unable to assess, pt has cerebral palsy, and is aphasic (DK equals don't know) Alert? Yes Normal Appearance?cerbral palsy  Oriented to person?  DKPlace? DK unable to asses Time? Unable to assess Displays appropriate judgment?unable to assess Can read the correct time from a watch face? no Are you having problems remembering things?unable to assess, pt has CP Advanced Directives have been discussed with the patient?Yes , full code, per her sisier , one of 3 siblings who agree on and are responsible for her care   List the Names of Other Physician/Practitioners you currently use: none   Indicate any recent Medical Services you may have received from other than [facility] providers in the past year (date may be approximate).   Assessment:    Annual Wellness Exam   Plan:    During the course of the visit the patient was educated and counseled about appropriate screening and preventive services including:  A healthy diet is rich in fruit, vegetables and whole grains. Poultry fish, nuts and beans are a healthy choice for protein rather then red meat. A low sodium diet and drinking 64 ounces of water daily is generally recommended. Oils and sweet should be limited. Carbohydrates especially for  those who are diabetic or overweight, should be limited to 30-45 gram per meal. It is important to eat on a regular schedule, at least 3 times daily. Snacks should be primarily fruits, vegetables or nuts. It is important that you exercise regularly at least 30 minutes 5 times  a week. If you develop chest pain, have severe difficulty breathing, or feel very tired, stop exercising immediately and seek medical attention  Immunization reviewed and updated. Cancer screening reviewed and updated    Patient Instructions (the written plan) was given to the patient.  Medicare Attestation  I have personally reviewed:  The patient's medical and social history  Their use of alcohol, tobacco or illicit drugs  Their current medications and supplements  The patient's functional ability including ADLs,fall risks, home safety risks, cognitive, and hearing and visual impairment  Diet and physical activities  Evidence for depression or mood disorders  The patient's weight, height, BMI, and visual acuity have been recorded in the chart. I have made referrals, counseling, and provided education to the patient based on review of the above and I have provided the patient with a written personalized care plan for preventive services.      Review of Systems     Objective:   Physical Exam        Assessment & Plan:

## 2012-11-17 NOTE — Patient Instructions (Signed)
F/U in 6 month, call if you need me before   Keep well Zianna    Call for labs needed before 6 month visit

## 2012-11-18 ENCOUNTER — Other Ambulatory Visit: Payer: Self-pay

## 2012-11-18 LAB — COMPREHENSIVE METABOLIC PANEL
ALT: 15 U/L (ref 0–35)
AST: 17 U/L (ref 0–37)
Albumin: 4.2 g/dL (ref 3.5–5.2)
CO2: 26 mEq/L (ref 19–32)
Calcium: 9.9 mg/dL (ref 8.4–10.5)
Chloride: 99 mEq/L (ref 96–112)
Potassium: 4.7 mEq/L (ref 3.5–5.3)
Total Protein: 6.9 g/dL (ref 6.0–8.3)

## 2012-11-18 LAB — LIPID PANEL
Cholesterol: 160 mg/dL (ref 0–200)
Triglycerides: 95 mg/dL (ref <150)

## 2012-11-18 LAB — HEMOGLOBIN A1C: Mean Plasma Glucose: 126 mg/dL — ABNORMAL HIGH (ref <117)

## 2012-11-18 LAB — TSH: TSH: 1.737 u[IU]/mL (ref 0.350–4.500)

## 2012-11-18 MED ORDER — PHENOBARBITAL 32.4 MG PO TABS: ORAL_TABLET | ORAL | Status: DC

## 2012-11-18 MED ORDER — RANITIDINE HCL 150 MG PO TABS: 150.0000 mg | ORAL_TABLET | Freq: Every day | ORAL | Status: DC

## 2012-11-18 MED ORDER — POTASSIUM CHLORIDE CRYS ER 10 MEQ PO TBCR: EXTENDED_RELEASE_TABLET | ORAL | Status: DC

## 2012-11-18 MED ORDER — BISOPROLOL-HYDROCHLOROTHIAZIDE 5-6.25 MG PO TABS: 1.0000 | ORAL_TABLET | Freq: Every day | ORAL | Status: DC

## 2012-11-18 MED ORDER — ZOCOR 10 MG PO TABS: ORAL_TABLET | ORAL | Status: DC

## 2012-11-20 DIAGNOSIS — Z0000 Encounter for general adult medical examination without abnormal findings: Secondary | ICD-10-CM | POA: Insufficient documentation

## 2012-11-20 NOTE — Assessment & Plan Note (Signed)
Reportedly has strted dry cough since coming into poffice , tessalon perles prescribed for as needed use. Chest exam is clear

## 2012-11-20 NOTE — Assessment & Plan Note (Signed)
Annual wellness as documented. Pt is fully dependent on assistance with all ADL's from birth She is blessed with a loving family who continues to prove her with love and excellent care in her home. She does also have a CAP worker who has been consistent for years. No changes in management. She remains a full code. Eye exam is suggested , sister will think about this

## 2013-05-11 ENCOUNTER — Telehealth: Payer: Self-pay

## 2013-05-11 DIAGNOSIS — E119 Type 2 diabetes mellitus without complications: Secondary | ICD-10-CM

## 2013-05-12 DIAGNOSIS — E119 Type 2 diabetes mellitus without complications: Secondary | ICD-10-CM | POA: Diagnosis not present

## 2013-05-12 LAB — COMPREHENSIVE METABOLIC PANEL
ALBUMIN: 4 g/dL (ref 3.5–5.2)
ALK PHOS: 87 U/L (ref 39–117)
ALT: 11 U/L (ref 0–35)
AST: 17 U/L (ref 0–37)
BUN: 13 mg/dL (ref 6–23)
CO2: 28 mEq/L (ref 19–32)
CREATININE: 0.62 mg/dL (ref 0.50–1.10)
Calcium: 9.5 mg/dL (ref 8.4–10.5)
Chloride: 101 mEq/L (ref 96–112)
GLUCOSE: 72 mg/dL (ref 70–99)
Potassium: 4.2 mEq/L (ref 3.5–5.3)
Sodium: 142 mEq/L (ref 135–145)
Total Bilirubin: 0.4 mg/dL (ref 0.2–1.2)
Total Protein: 7.1 g/dL (ref 6.0–8.3)

## 2013-05-12 LAB — HEMOGLOBIN A1C
HEMOGLOBIN A1C: 6.1 % — AB (ref <5.7)
Mean Plasma Glucose: 128 mg/dL — ABNORMAL HIGH (ref <117)

## 2013-05-12 NOTE — Telephone Encounter (Signed)
Encounter for labs.

## 2013-05-16 ENCOUNTER — Other Ambulatory Visit: Payer: Self-pay | Admitting: Family Medicine

## 2013-05-18 ENCOUNTER — Encounter (INDEPENDENT_AMBULATORY_CARE_PROVIDER_SITE_OTHER): Payer: Self-pay

## 2013-05-18 ENCOUNTER — Encounter: Payer: Self-pay | Admitting: Family Medicine

## 2013-05-18 ENCOUNTER — Ambulatory Visit (INDEPENDENT_AMBULATORY_CARE_PROVIDER_SITE_OTHER): Payer: Medicare Other | Admitting: Family Medicine

## 2013-05-18 VITALS — BP 108/70 | HR 74 | Resp 18

## 2013-05-18 DIAGNOSIS — D239 Other benign neoplasm of skin, unspecified: Secondary | ICD-10-CM

## 2013-05-18 DIAGNOSIS — K5909 Other constipation: Secondary | ICD-10-CM

## 2013-05-18 DIAGNOSIS — E119 Type 2 diabetes mellitus without complications: Secondary | ICD-10-CM | POA: Diagnosis not present

## 2013-05-18 DIAGNOSIS — D229 Melanocytic nevi, unspecified: Secondary | ICD-10-CM

## 2013-05-18 DIAGNOSIS — I10 Essential (primary) hypertension: Secondary | ICD-10-CM

## 2013-05-18 DIAGNOSIS — R569 Unspecified convulsions: Secondary | ICD-10-CM

## 2013-05-18 DIAGNOSIS — K219 Gastro-esophageal reflux disease without esophagitis: Secondary | ICD-10-CM

## 2013-05-18 DIAGNOSIS — E785 Hyperlipidemia, unspecified: Secondary | ICD-10-CM | POA: Diagnosis not present

## 2013-05-18 DIAGNOSIS — M949 Disorder of cartilage, unspecified: Secondary | ICD-10-CM

## 2013-05-18 DIAGNOSIS — M2450 Contracture, unspecified joint: Secondary | ICD-10-CM | POA: Diagnosis not present

## 2013-05-18 DIAGNOSIS — M899 Disorder of bone, unspecified: Secondary | ICD-10-CM

## 2013-05-18 NOTE — Assessment & Plan Note (Addendum)
Refer PT/OT for home health to eval and treat , also will provide education for caregivers as far as home exercises which will be beneficial Pt is wheelchair restricted and incapable of ADL's

## 2013-05-18 NOTE — Patient Instructions (Signed)
F/u mid November, call if you need me before  CBC, lipid, cmp, HBa1C , tSH and vit D in November before follow up   You are referred for home PT/OT for contractures   Please continue to take good care of your skin, if there is breakdown/worsening , call for referral to wound clinic  Labs are excellent, heart and lung s and abdominal exam are good, no leg swelling present

## 2013-05-19 DIAGNOSIS — D229 Melanocytic nevi, unspecified: Secondary | ICD-10-CM | POA: Insufficient documentation

## 2013-05-19 NOTE — Assessment & Plan Note (Signed)
Hyperlipidemia:Low fat diet discussed and encouraged.  Updated lab needed at/ before next visit. Well controlled when last tested, continue current med

## 2013-05-19 NOTE — Assessment & Plan Note (Signed)
Controlled, no change in medication  

## 2013-05-19 NOTE — Assessment & Plan Note (Signed)
Well controlled on current regime 

## 2013-05-19 NOTE — Assessment & Plan Note (Signed)
Hyperpigmented macular lesions on right side of face approx 6 appear to be benign moles, family reassured, will call back if further concern in this regard

## 2013-05-19 NOTE — Assessment & Plan Note (Signed)
Asymptomatic uses zantac

## 2013-05-19 NOTE — Assessment & Plan Note (Signed)
diet controlled , no change in management , doing extremely well

## 2013-05-19 NOTE — Assessment & Plan Note (Signed)
noi seizures, maintained on medication prophylacticaly for years

## 2013-05-19 NOTE — Progress Notes (Signed)
   Subjective:    Patient ID: April Watson, female    DOB: [birth year], 53 y.o.   MRN: [id number]  HPI Patient in for follow up of her chronic medical conditions.Her sister and a caregiver accompany her Generally , she has been doing well. No recent fever chills, nasal drainage or excessive cough noted. Appetite is good , bowel movements every one to 2 days, and sleep is good. No h/o seizure activity. Concern re contracture of limbs and requests PT/OT eval in home for  Further management, which is appropriate, pt is chronically immobile and is mentally challenged due to hydrocephalus present from birth  Ulcer on left foot is doing well managed by topical ointment, no drainage, discoloration noted on left upper back which is new, however no redness , warmth, skin breakdown or drainage. Attempts are made to turn patient on a frequent basis while awake for skin protection   Review of Systems See HPI, history form sister and caregiver, pt uhas expressive aphasia Denies recent fever or chills. Denies sinus pressure, nasal nasal drainage. Denies chest congestion, productive cough or wheezing. Denies PND , orhtopnea and leg swelling Denies abdominal pain, nausea, vomiting,diarrhea or constipation. Relies on bowel regime to maintain regular BM  Denies malodorous urine, incontinence due to lack of mobility  Chronic limitation in mobility, increased stiffness and reduced mobility noted in hands and upper extremities noted  Denies  seizures,  Denies  anxiety or insomnia. .        Objective:   Physical Exam BP 108/70  Pulse 74  Resp 18  SpO2 98% Patient alert  and in no cardiopulmonary distress.Pleasant , smiling, aphasic, follows with eyes  HEENT:Hydrocephalus, No facial asymmetry, EOMI, no sinus tenderness,  oropharynx pink and moist.  Neck no JVD, no adenopathy.  Chest: Clear to auscultation bilaterally.  CVS: S1, S2 no murmurs, no S3.  ABD: Soft non tender. Bowel sounds  normal.  Ext: No edema  MS: decreased ROM spine, shoulders, hips and knees.Contracture of upper and lower extremeties due to lack of power and regular use  Skin: moles on right side of face, hyperpigmentation on left upper back , appears like bruising, no skin breakdown, chronic foot ulcer with no erythema warmth or drainage  Psych: Good eye contact, t not anxious or depressed appearing.  CNS: CN 2-12 intact, reduced  Power and  tone  throughout.        Assessment & Plan:  Flexion contractures Refer PT/OT for home health to eval and treat , also will provide education for caregivers as far as home exercises which will be beneficial Pt is wheelchair restricted and incapable of ADL's  HYPERTENSION Controlled, no change in medication   DM (diabetes mellitus), type 2 diet controlled , no change in management , doing extremely well  SEIZURE DISORDER noi seizures, maintained on medication prophylacticaly for years  HYPERLIPIDEMIA Hyperlipidemia:Low fat diet discussed and encouraged.  Updated lab needed at/ before next visit. Well controlled when last tested, continue current med  Benign mole Hyperpigmented macular lesions on right side of face approx 6 appear to be benign moles, family reassured, will call back if further concern in this regard  CONSTIPATION, CHRONIC Well controlled on current regime  GERD Asymptomatic uses zantac

## 2013-05-22 DIAGNOSIS — M24569 Contracture, unspecified knee: Secondary | ICD-10-CM | POA: Diagnosis not present

## 2013-05-22 DIAGNOSIS — I10 Essential (primary) hypertension: Secondary | ICD-10-CM | POA: Diagnosis not present

## 2013-05-22 DIAGNOSIS — M1990 Unspecified osteoarthritis, unspecified site: Secondary | ICD-10-CM | POA: Diagnosis not present

## 2013-05-22 DIAGNOSIS — E119 Type 2 diabetes mellitus without complications: Secondary | ICD-10-CM | POA: Diagnosis not present

## 2013-05-22 DIAGNOSIS — Q039 Congenital hydrocephalus, unspecified: Secondary | ICD-10-CM | POA: Diagnosis not present

## 2013-05-22 DIAGNOSIS — Z993 Dependence on wheelchair: Secondary | ICD-10-CM | POA: Diagnosis not present

## 2013-05-22 DIAGNOSIS — Z5189 Encounter for other specified aftercare: Secondary | ICD-10-CM | POA: Diagnosis not present

## 2013-05-26 DIAGNOSIS — Z5189 Encounter for other specified aftercare: Secondary | ICD-10-CM | POA: Diagnosis not present

## 2013-05-26 DIAGNOSIS — Q039 Congenital hydrocephalus, unspecified: Secondary | ICD-10-CM | POA: Diagnosis not present

## 2013-05-26 DIAGNOSIS — E119 Type 2 diabetes mellitus without complications: Secondary | ICD-10-CM | POA: Diagnosis not present

## 2013-05-26 DIAGNOSIS — M24569 Contracture, unspecified knee: Secondary | ICD-10-CM | POA: Diagnosis not present

## 2013-05-26 DIAGNOSIS — I10 Essential (primary) hypertension: Secondary | ICD-10-CM | POA: Diagnosis not present

## 2013-05-26 DIAGNOSIS — M1990 Unspecified osteoarthritis, unspecified site: Secondary | ICD-10-CM | POA: Diagnosis not present

## 2013-05-29 DIAGNOSIS — M24569 Contracture, unspecified knee: Secondary | ICD-10-CM | POA: Diagnosis not present

## 2013-05-29 DIAGNOSIS — I10 Essential (primary) hypertension: Secondary | ICD-10-CM | POA: Diagnosis not present

## 2013-05-29 DIAGNOSIS — Q039 Congenital hydrocephalus, unspecified: Secondary | ICD-10-CM | POA: Diagnosis not present

## 2013-05-29 DIAGNOSIS — Z5189 Encounter for other specified aftercare: Secondary | ICD-10-CM | POA: Diagnosis not present

## 2013-05-29 DIAGNOSIS — E119 Type 2 diabetes mellitus without complications: Secondary | ICD-10-CM | POA: Diagnosis not present

## 2013-05-29 DIAGNOSIS — M1990 Unspecified osteoarthritis, unspecified site: Secondary | ICD-10-CM | POA: Diagnosis not present

## 2013-05-30 DIAGNOSIS — Z5189 Encounter for other specified aftercare: Secondary | ICD-10-CM | POA: Diagnosis not present

## 2013-05-30 DIAGNOSIS — I10 Essential (primary) hypertension: Secondary | ICD-10-CM | POA: Diagnosis not present

## 2013-05-30 DIAGNOSIS — M24569 Contracture, unspecified knee: Secondary | ICD-10-CM | POA: Diagnosis not present

## 2013-05-30 DIAGNOSIS — Q039 Congenital hydrocephalus, unspecified: Secondary | ICD-10-CM | POA: Diagnosis not present

## 2013-05-30 DIAGNOSIS — M1990 Unspecified osteoarthritis, unspecified site: Secondary | ICD-10-CM | POA: Diagnosis not present

## 2013-05-30 DIAGNOSIS — E119 Type 2 diabetes mellitus without complications: Secondary | ICD-10-CM | POA: Diagnosis not present

## 2013-06-02 DIAGNOSIS — Q039 Congenital hydrocephalus, unspecified: Secondary | ICD-10-CM | POA: Diagnosis not present

## 2013-06-02 DIAGNOSIS — M24569 Contracture, unspecified knee: Secondary | ICD-10-CM | POA: Diagnosis not present

## 2013-06-02 DIAGNOSIS — E119 Type 2 diabetes mellitus without complications: Secondary | ICD-10-CM | POA: Diagnosis not present

## 2013-06-02 DIAGNOSIS — M1990 Unspecified osteoarthritis, unspecified site: Secondary | ICD-10-CM | POA: Diagnosis not present

## 2013-06-02 DIAGNOSIS — I10 Essential (primary) hypertension: Secondary | ICD-10-CM | POA: Diagnosis not present

## 2013-06-02 DIAGNOSIS — Z5189 Encounter for other specified aftercare: Secondary | ICD-10-CM | POA: Diagnosis not present

## 2013-06-05 DIAGNOSIS — M1990 Unspecified osteoarthritis, unspecified site: Secondary | ICD-10-CM | POA: Diagnosis not present

## 2013-06-05 DIAGNOSIS — E119 Type 2 diabetes mellitus without complications: Secondary | ICD-10-CM | POA: Diagnosis not present

## 2013-06-05 DIAGNOSIS — M24569 Contracture, unspecified knee: Secondary | ICD-10-CM | POA: Diagnosis not present

## 2013-06-05 DIAGNOSIS — I10 Essential (primary) hypertension: Secondary | ICD-10-CM | POA: Diagnosis not present

## 2013-06-05 DIAGNOSIS — Z5189 Encounter for other specified aftercare: Secondary | ICD-10-CM | POA: Diagnosis not present

## 2013-06-05 DIAGNOSIS — Q039 Congenital hydrocephalus, unspecified: Secondary | ICD-10-CM | POA: Diagnosis not present

## 2013-06-07 DIAGNOSIS — Z5189 Encounter for other specified aftercare: Secondary | ICD-10-CM | POA: Diagnosis not present

## 2013-06-07 DIAGNOSIS — E119 Type 2 diabetes mellitus without complications: Secondary | ICD-10-CM | POA: Diagnosis not present

## 2013-06-07 DIAGNOSIS — M1990 Unspecified osteoarthritis, unspecified site: Secondary | ICD-10-CM | POA: Diagnosis not present

## 2013-06-07 DIAGNOSIS — I10 Essential (primary) hypertension: Secondary | ICD-10-CM | POA: Diagnosis not present

## 2013-06-07 DIAGNOSIS — M24569 Contracture, unspecified knee: Secondary | ICD-10-CM | POA: Diagnosis not present

## 2013-06-07 DIAGNOSIS — Q039 Congenital hydrocephalus, unspecified: Secondary | ICD-10-CM | POA: Diagnosis not present

## 2013-06-08 ENCOUNTER — Other Ambulatory Visit: Payer: Self-pay | Admitting: Family Medicine

## 2013-06-13 DIAGNOSIS — E119 Type 2 diabetes mellitus without complications: Secondary | ICD-10-CM | POA: Diagnosis not present

## 2013-06-13 DIAGNOSIS — M24569 Contracture, unspecified knee: Secondary | ICD-10-CM | POA: Diagnosis not present

## 2013-06-13 DIAGNOSIS — M1990 Unspecified osteoarthritis, unspecified site: Secondary | ICD-10-CM | POA: Diagnosis not present

## 2013-06-13 DIAGNOSIS — Q039 Congenital hydrocephalus, unspecified: Secondary | ICD-10-CM | POA: Diagnosis not present

## 2013-06-13 DIAGNOSIS — Z5189 Encounter for other specified aftercare: Secondary | ICD-10-CM | POA: Diagnosis not present

## 2013-06-13 DIAGNOSIS — I10 Essential (primary) hypertension: Secondary | ICD-10-CM | POA: Diagnosis not present

## 2013-06-15 DIAGNOSIS — Q039 Congenital hydrocephalus, unspecified: Secondary | ICD-10-CM | POA: Diagnosis not present

## 2013-06-15 DIAGNOSIS — Z5189 Encounter for other specified aftercare: Secondary | ICD-10-CM | POA: Diagnosis not present

## 2013-06-15 DIAGNOSIS — E119 Type 2 diabetes mellitus without complications: Secondary | ICD-10-CM | POA: Diagnosis not present

## 2013-06-15 DIAGNOSIS — M1990 Unspecified osteoarthritis, unspecified site: Secondary | ICD-10-CM | POA: Diagnosis not present

## 2013-06-15 DIAGNOSIS — I10 Essential (primary) hypertension: Secondary | ICD-10-CM | POA: Diagnosis not present

## 2013-06-15 DIAGNOSIS — M24569 Contracture, unspecified knee: Secondary | ICD-10-CM | POA: Diagnosis not present

## 2013-06-21 DIAGNOSIS — M1990 Unspecified osteoarthritis, unspecified site: Secondary | ICD-10-CM | POA: Diagnosis not present

## 2013-06-21 DIAGNOSIS — M24569 Contracture, unspecified knee: Secondary | ICD-10-CM | POA: Diagnosis not present

## 2013-06-21 DIAGNOSIS — Q039 Congenital hydrocephalus, unspecified: Secondary | ICD-10-CM | POA: Diagnosis not present

## 2013-06-21 DIAGNOSIS — E119 Type 2 diabetes mellitus without complications: Secondary | ICD-10-CM | POA: Diagnosis not present

## 2013-06-21 DIAGNOSIS — Z5189 Encounter for other specified aftercare: Secondary | ICD-10-CM | POA: Diagnosis not present

## 2013-06-21 DIAGNOSIS — I10 Essential (primary) hypertension: Secondary | ICD-10-CM | POA: Diagnosis not present

## 2013-07-05 ENCOUNTER — Other Ambulatory Visit: Payer: Self-pay

## 2013-07-05 MED ORDER — CLOTRIMAZOLE-BETAMETHASONE 1-0.05 % EX CREA: TOPICAL_CREAM | CUTANEOUS | Status: DC

## 2013-07-12 ENCOUNTER — Other Ambulatory Visit: Payer: Self-pay | Admitting: Family Medicine

## 2013-08-11 ENCOUNTER — Other Ambulatory Visit: Payer: Self-pay | Admitting: Family Medicine

## 2013-09-15 ENCOUNTER — Other Ambulatory Visit: Payer: Self-pay | Admitting: Family Medicine

## 2013-11-10 ENCOUNTER — Other Ambulatory Visit: Payer: Self-pay | Admitting: Family Medicine

## 2013-11-16 ENCOUNTER — Other Ambulatory Visit: Payer: Self-pay | Admitting: Family Medicine

## 2013-11-25 ENCOUNTER — Other Ambulatory Visit: Payer: Self-pay | Admitting: Family Medicine

## 2013-11-25 DIAGNOSIS — E785 Hyperlipidemia, unspecified: Secondary | ICD-10-CM | POA: Diagnosis not present

## 2013-11-25 DIAGNOSIS — E119 Type 2 diabetes mellitus without complications: Secondary | ICD-10-CM | POA: Diagnosis not present

## 2013-11-25 DIAGNOSIS — I10 Essential (primary) hypertension: Secondary | ICD-10-CM | POA: Diagnosis not present

## 2013-11-25 LAB — COMPREHENSIVE METABOLIC PANEL
ALBUMIN: 4 g/dL (ref 3.5–5.2)
ALT: 16 U/L (ref 0–35)
AST: 16 U/L (ref 0–37)
Alkaline Phosphatase: 90 U/L (ref 39–117)
BUN: 10 mg/dL (ref 6–23)
CALCIUM: 9.5 mg/dL (ref 8.4–10.5)
CHLORIDE: 98 meq/L (ref 96–112)
CO2: 28 meq/L (ref 19–32)
CREATININE: 0.68 mg/dL (ref 0.50–1.10)
Glucose, Bld: 89 mg/dL (ref 70–99)
POTASSIUM: 4.1 meq/L (ref 3.5–5.3)
Sodium: 136 mEq/L (ref 135–145)
Total Bilirubin: 0.3 mg/dL (ref 0.2–1.2)
Total Protein: 7.1 g/dL (ref 6.0–8.3)

## 2013-11-25 LAB — CBC
HEMATOCRIT: 42 % (ref 36.0–46.0)
Hemoglobin: 14.7 g/dL (ref 12.0–15.0)
MCH: 30.1 pg (ref 26.0–34.0)
MCHC: 35 g/dL (ref 30.0–36.0)
MCV: 85.9 fL (ref 78.0–100.0)
Platelets: 214 10*3/uL (ref 150–400)
RBC: 4.89 MIL/uL (ref 3.87–5.11)
RDW: 13.2 % (ref 11.5–15.5)
WBC: 8.3 10*3/uL (ref 4.0–10.5)

## 2013-11-25 LAB — LIPID PANEL
CHOL/HDL RATIO: 3.6 ratio
CHOLESTEROL: 149 mg/dL (ref 0–200)
HDL: 41 mg/dL (ref >39)
LDL Cholesterol: 89 mg/dL (ref 0–99)
Triglycerides: 97 mg/dL (ref <150)
VLDL: 19 mg/dL (ref 0–40)

## 2013-11-25 LAB — TSH: TSH: 1.604 u[IU]/mL (ref 0.350–4.500)

## 2013-11-26 LAB — HEMOGLOBIN A1C
Hgb A1c MFr Bld: 5.9 % — ABNORMAL HIGH (ref <5.7)
Mean Plasma Glucose: 123 mg/dL — ABNORMAL HIGH (ref <117)

## 2013-11-27 LAB — VITAMIN D 25 HYDROXY (VIT D DEFICIENCY, FRACTURES): VIT D 25 HYDROXY: 16 ng/mL — AB (ref 30–89)

## 2013-11-29 MED ORDER — VITAMIN D (ERGOCALCIFEROL) 1.25 MG (50000 UNIT) PO CAPS: 50000.0000 [IU] | ORAL_CAPSULE | ORAL | Status: DC

## 2013-11-29 NOTE — Addendum Note (Signed)
Addended by: Denman George B on: 11/29/2013 04:34 PM   Modules accepted: Orders

## 2013-12-06 ENCOUNTER — Ambulatory Visit (INDEPENDENT_AMBULATORY_CARE_PROVIDER_SITE_OTHER): Payer: Medicare Other | Admitting: Family Medicine

## 2013-12-06 ENCOUNTER — Encounter (INDEPENDENT_AMBULATORY_CARE_PROVIDER_SITE_OTHER): Payer: Self-pay

## 2013-12-06 ENCOUNTER — Encounter: Payer: Self-pay | Admitting: Family Medicine

## 2013-12-06 VITALS — BP 114/72 | HR 64 | Resp 16

## 2013-12-06 DIAGNOSIS — K5909 Other constipation: Secondary | ICD-10-CM | POA: Diagnosis not present

## 2013-12-06 DIAGNOSIS — E119 Type 2 diabetes mellitus without complications: Secondary | ICD-10-CM

## 2013-12-06 DIAGNOSIS — E785 Hyperlipidemia, unspecified: Secondary | ICD-10-CM | POA: Diagnosis not present

## 2013-12-06 DIAGNOSIS — R569 Unspecified convulsions: Secondary | ICD-10-CM

## 2013-12-06 DIAGNOSIS — I10 Essential (primary) hypertension: Secondary | ICD-10-CM | POA: Diagnosis not present

## 2013-12-06 DIAGNOSIS — B3789 Other sites of candidiasis: Secondary | ICD-10-CM

## 2013-12-06 DIAGNOSIS — E559 Vitamin D deficiency, unspecified: Secondary | ICD-10-CM | POA: Diagnosis not present

## 2013-12-06 DIAGNOSIS — F79 Unspecified intellectual disabilities: Secondary | ICD-10-CM | POA: Diagnosis not present

## 2013-12-06 DIAGNOSIS — B372 Candidiasis of skin and nail: Secondary | ICD-10-CM | POA: Insufficient documentation

## 2013-12-06 MED ORDER — PHENOBARBITAL 32.4 MG PO TABS: ORAL_TABLET | ORAL | Status: DC

## 2013-12-06 MED ORDER — RANITIDINE HCL 150 MG PO TABS: ORAL_TABLET | ORAL | Status: DC

## 2013-12-06 MED ORDER — SIMVASTATIN 10 MG PO TABS: ORAL_TABLET | ORAL | Status: DC

## 2013-12-06 MED ORDER — BISOPROLOL-HYDROCHLOROTHIAZIDE 5-6.25 MG PO TABS: ORAL_TABLET | ORAL | Status: DC

## 2013-12-06 MED ORDER — NYSTATIN 100000 UNIT/GM EX POWD: CUTANEOUS | Status: DC

## 2013-12-06 MED ORDER — DOCUSATE SODIUM 100 MG PO CAPS: ORAL_CAPSULE | ORAL | Status: DC

## 2013-12-06 MED ORDER — POTASSIUM CHLORIDE CRYS ER 10 MEQ PO TBCR: EXTENDED_RELEASE_TABLET | ORAL | Status: DC

## 2013-12-06 NOTE — Patient Instructions (Addendum)
F/u in 5.5 month, please call if you need me before, with prevnar  Labs are excellent, and foot exam is good  Antifungal powder sent in for use in moist areas where you have a rash  Vit D, HBa1C, chem 7 and EGFr in 5.5 month

## 2014-01-21 DIAGNOSIS — E559 Vitamin D deficiency, unspecified: Secondary | ICD-10-CM | POA: Insufficient documentation

## 2014-01-21 NOTE — Assessment & Plan Note (Signed)
Once weekly supplement prescribed Updated lab needed at/ before next visit.

## 2014-01-21 NOTE — Assessment & Plan Note (Signed)
Controlled, no change in medication Hyperlipidemia:Low fat diet discussed and encouraged.  \ 

## 2014-01-21 NOTE — Assessment & Plan Note (Signed)
Controlled, no change in medication  

## 2014-01-21 NOTE — Assessment & Plan Note (Signed)
Well managed on current regime of daily mirilax continue same, also attention paid to high fiber diet and water intake encouraged

## 2014-01-21 NOTE — Assessment & Plan Note (Signed)
Topical nystatin to alternate with clotrimazole , as needed, encouraged family to keep area dry

## 2014-01-21 NOTE — Assessment & Plan Note (Signed)
Stable no seizure activity in over 5 years, continue current med management

## 2014-01-21 NOTE — Assessment & Plan Note (Signed)
Pt has congenital hydrocephalus , and has been extremely well  cared for by her family since birth

## 2014-01-21 NOTE — Progress Notes (Signed)
   Subjective:    Patient ID: April Watson, female    DOB: [birth year], 54 y.o.   MRN: [id number]  HPI The PT is here for follow up and re-evaluation of chronic medical conditions, medication management and review of any available recent lab and radiology data.  Preventive health is updated, specifically   Immunization.         Review of Systems See HPI History is from her 2 sibs who are her caregivers, only concern is of rash under rigth breast , otherwise doing well, with no recent fever , chills or cough Appetite is as usual and she sleeps well No h/o falls, dependent on assistance for transfer      Objective:   Physical Exam BP 114/72 mmHg  Pulse 64  Resp 16  SpO2 100% Patient alert  and in no cardiopulmonary distress.  HEENT: No facial asymmetry, marked hydrocephalus, EOMI,   oropharynx pink and moist.  Neck  decreased ROM, no mass.  Chest: Clear to auscultation bilaterally.  CVS: S1, S2 no murmurs, no S3.Regular rate.  ABD: Soft non tender.   Ext: No edema  MS: decreased ROM spine hips and  Knees, muscle atrophy and reduce power and tome throughout  Skin: Intact, fungal rash under right breast with small amt of yeast infection also  Psych: Good eye contact, anxious or depressed appearing.          Assessment & Plan:  Essential hypertension Controlled, no change in medication   CONSTIPATION, CHRONIC Well managed on current regime of daily mirilax continue same, also attention paid to high fiber diet and water intake encouraged  DM (diabetes mellitus), type 2 Controlled, no change in medication   Convulsions Stable no seizure activity in over 5 years, continue current med management  Hyperlipidemia LDL goal <100 Controlled, no change in medication Hyperlipidemia:Low fat diet discussed and encouraged.    Mental retardation Pt has congenital hydrocephalus , and has been extremely well  cared for by her family since birth  Candidiasis of  breast Topical nystatin to alternate with clotrimazole , as needed, encouraged family to keep area dry  Vitamin D deficiency Once weekly supplement prescribed Updated lab needed at/ before next visit.

## 2014-03-09 ENCOUNTER — Other Ambulatory Visit: Payer: Self-pay | Admitting: Family Medicine

## 2014-03-22 ENCOUNTER — Other Ambulatory Visit: Payer: Self-pay | Admitting: Family Medicine

## 2014-05-14 ENCOUNTER — Other Ambulatory Visit: Payer: Self-pay | Admitting: Family Medicine

## 2014-06-12 ENCOUNTER — Ambulatory Visit: Payer: Medicare Other | Admitting: Family Medicine

## 2014-06-13 ENCOUNTER — Other Ambulatory Visit: Payer: Self-pay | Admitting: Family Medicine

## 2014-06-16 ENCOUNTER — Other Ambulatory Visit: Payer: Self-pay | Admitting: Family Medicine

## 2014-06-16 DIAGNOSIS — E785 Hyperlipidemia, unspecified: Secondary | ICD-10-CM | POA: Diagnosis not present

## 2014-06-16 DIAGNOSIS — E119 Type 2 diabetes mellitus without complications: Secondary | ICD-10-CM | POA: Diagnosis not present

## 2014-06-16 DIAGNOSIS — E559 Vitamin D deficiency, unspecified: Secondary | ICD-10-CM | POA: Diagnosis not present

## 2014-06-16 LAB — COMPLETE METABOLIC PANEL WITH GFR
ALT: 14 U/L (ref 0–35)
AST: 17 U/L (ref 0–37)
Albumin: 4.4 g/dL (ref 3.5–5.2)
Alkaline Phosphatase: 79 U/L (ref 39–117)
BUN: 10 mg/dL (ref 6–23)
CHLORIDE: 99 meq/L (ref 96–112)
CO2: 30 meq/L (ref 19–32)
Calcium: 9.5 mg/dL (ref 8.4–10.5)
Creat: 0.62 mg/dL (ref 0.50–1.10)
GFR, Est African American: >89 mL/min
GFR, Est Non African American: >89 mL/min
Glucose, Bld: 97 mg/dL (ref 70–99)
Potassium: 3.7 mEq/L (ref 3.5–5.3)
Sodium: 137 mEq/L (ref 135–145)
Total Bilirubin: 0.3 mg/dL (ref 0.2–1.2)
Total Protein: 7 g/dL (ref 6.0–8.3)

## 2014-06-17 LAB — HEMOGLOBIN A1C
Hgb A1c MFr Bld: 6.1 % — ABNORMAL HIGH (ref <5.7)
Mean Plasma Glucose: 128 mg/dL — ABNORMAL HIGH (ref <117)

## 2014-06-18 LAB — VITAMIN D 25 HYDROXY (VIT D DEFICIENCY, FRACTURES): Vit D, 25-Hydroxy: 81 ng/mL (ref 30–100)

## 2014-06-19 ENCOUNTER — Encounter: Payer: Self-pay | Admitting: Family Medicine

## 2014-06-19 ENCOUNTER — Ambulatory Visit (INDEPENDENT_AMBULATORY_CARE_PROVIDER_SITE_OTHER): Payer: Medicare Other | Admitting: Family Medicine

## 2014-06-19 VITALS — BP 122/76 | HR 78 | Resp 18

## 2014-06-19 DIAGNOSIS — I10 Essential (primary) hypertension: Secondary | ICD-10-CM

## 2014-06-19 DIAGNOSIS — F79 Unspecified intellectual disabilities: Secondary | ICD-10-CM

## 2014-06-19 DIAGNOSIS — E785 Hyperlipidemia, unspecified: Secondary | ICD-10-CM

## 2014-06-19 DIAGNOSIS — Z23 Encounter for immunization: Secondary | ICD-10-CM | POA: Diagnosis not present

## 2014-06-19 DIAGNOSIS — Z0000 Encounter for general adult medical examination without abnormal findings: Secondary | ICD-10-CM | POA: Diagnosis not present

## 2014-06-19 DIAGNOSIS — E119 Type 2 diabetes mellitus without complications: Secondary | ICD-10-CM

## 2014-06-19 MED ORDER — PHENOBARBITAL 30 MG PO TABS: 30.0000 mg | ORAL_TABLET | Freq: Two times a day (BID) | ORAL | Status: DC

## 2014-06-19 MED ORDER — BETAMETHASONE DIPROPIONATE AUG 0.05 % EX OINT: TOPICAL_OINTMENT | Freq: Two times a day (BID) | CUTANEOUS | Status: DC

## 2014-06-19 MED ORDER — POLYETHYLENE GLYCOL 3350 17 GM/SCOOP PO POWD: ORAL | Status: DC

## 2014-06-19 MED ORDER — DOCUSATE SODIUM 100 MG PO CAPS: ORAL_CAPSULE | ORAL | Status: AC

## 2014-06-19 MED ORDER — BISOPROLOL-HYDROCHLOROTHIAZIDE 5-6.25 MG PO TABS: ORAL_TABLET | ORAL | Status: DC

## 2014-06-19 MED ORDER — POTASSIUM CHLORIDE CRYS ER 10 MEQ PO TBCR: EXTENDED_RELEASE_TABLET | ORAL | Status: DC

## 2014-06-19 MED ORDER — RANITIDINE HCL 150 MG PO TABS: ORAL_TABLET | ORAL | Status: DC

## 2014-06-19 MED ORDER — SIMVASTATIN 10 MG PO TABS: ORAL_TABLET | ORAL | Status: DC

## 2014-06-19 NOTE — Assessment & Plan Note (Signed)
After obtaining informed consent, the vaccine is  administered by LPN.  

## 2014-06-19 NOTE — Progress Notes (Signed)
Subjective:    Patient ID: April Watson, female    DOB: 05-Jul-1960, 54 y.o.   MRN: [id number]  HPI Preventive Screening-Counseling & Management   Patient present here today for a Medicare annual wellness visit.   Current Problems (verified)   Medications Prior to Visit Allergies (verified)   PAST HISTORY  Family History  Social History lives with her sister, and has had hydrocephalus since birth, incapable of living independently and caring for herself due to brain damage which has left her with expressive aphasia and quadriplegia   Risk Factors  Current exercise habits:  Unable  Dietary issues discussed:lots of vegetables, less cake and pies   Cardiac risk factors: diabetes  Depression Screen  (Note: if answer to either of the following is "Yes", a more complete depression screening is indicated)  Unable to assess with questionnaire, questions answered based on family/ care giver impressions Over the past two weeks, have you felt down, depressed or hopeless? No  Over the past two weeks, have you felt little interest or pleasure in doing things? N/A  Have you lost interest or pleasure in daily life? No  Do you often feel hopeless? Unable to assess Do you cry easily over simple problems? No   Activities of Daily Living  In your present state of health, do you have any difficulty performing the following activities?  Driving?: Incapable Managing money?: Incapable, sibling take care of this Feeding yourself?:No Getting from bed to chair?:Unable Climbing a flight of stairs?Incapable Preparing food and eating?:Incapable Bathing or showering?:needs help, incapable  Getting dressed?:Needs help Getting to the toilet?:with help, pt uses commode for all bodily functions Using the toilet?:No Moving around from place to place?: Incapable  Fall Risk Assessment In the past year have you fallen or had a near fall?:No, despite being high risk however she is moved by  hercaregivers Are you currently taking any medications that make you dizzy?:Not noted   Hearing Difficulties: Not seemingly so Do you often ask people to speak up or repeat themselves?:N/A  Do you experience ringing or noises in your ears?:N/A Do you have difficulty understanding soft or whispered voices?:N/A  Cognitive Testing , unable to assess, pt is unable to communicate verbally due to congenital hydrocephalus resulting in brain damage, she is quadriplegic, and  unable to communicate Alert? Yes Normal Appearance?Yes  Oriented to person? n/a Place? n/a  Time? n/a  Displays appropriate judgment?incapable due to marked hydrocephalus with cognitive impairment and quadriplegia Can read the correct time from a watch face? no Are you having problems remembering things?n/a  Advanced Directives have been discussed with the patient?Yes , full code, she is accompanied by her 2 sisters, all 3 siblings including her only brother are extremely attentive and I believe are on one accord as far as her health decision making is concerned, will need to  Address establishment of a single person to have HCPOA at next visit   List the Names of Other Physician/Practitioners you currently use:    Indicate any recent Medical Services you may have received from other than [facility] providers in the past year (date may be approximate).   Assessment:    Annual Wellness Exam   Plan:    Patient Instructions (the written plan) was given to the patient.  Medicare Attestation  I have personally reviewed:  The patient's medical and social history  Their use of alcohol, tobacco or illicit drugs  Their current medications and supplements  The patient's functional ability including ADLs,fall risks,  home safety risks, cognitive, and hearing and visual impairment  Diet and physical activities  Evidence for depression or mood disorders  The patient's weight, height, BMI, and visual acuity have been recorded in the  chart. I have made referrals, counseling, and provided education to the patient based on review of the above and I have provided the patient with a written personalized care plan for preventive services.      Review of Systems     Objective:   Physical Exam BP 122/76 mmHg  Pulse 78  Resp 18  SpO2 96%         Assessment & Plan:  Medicare annual wellness visit, subsequent Annual exam as documented.  Regular seat belt use and home safety, is also discussed.  Immunization ds are specifically addressed at this visit.    Need for vaccination with 13-polyvalent pneumococcal conjugate vaccine After obtaining informed consent, the vaccine is  administered by LPN.

## 2014-06-19 NOTE — Patient Instructions (Addendum)
Annual physical exam in 5.5 month, call if you8 need me sooner  Fasting labs in 5.5 month  Prevnar today.  I hope that you continue to keep well, labs are excellent!

## 2014-06-19 NOTE — Assessment & Plan Note (Signed)
Annual exam as documented.  Regular seat belt use and home safety, is also discussed.  Immunization ds are specifically addressed at this visit.

## 2014-06-20 LAB — LIPID PANEL
CHOLESTEROL: 159 mg/dL (ref 0–200)
HDL: 52 mg/dL (ref >=46)
LDL Cholesterol: 91 mg/dL (ref 0–99)
Total CHOL/HDL Ratio: 3.1 Ratio
Triglycerides: 80 mg/dL (ref <150)
VLDL: 16 mg/dL (ref 0–40)

## 2014-07-24 ENCOUNTER — Other Ambulatory Visit: Payer: Self-pay | Admitting: Family Medicine

## 2014-08-24 ENCOUNTER — Other Ambulatory Visit: Payer: Self-pay | Admitting: Family Medicine

## 2014-09-17 ENCOUNTER — Other Ambulatory Visit: Payer: Self-pay | Admitting: Family Medicine

## 2014-12-12 DIAGNOSIS — E119 Type 2 diabetes mellitus without complications: Secondary | ICD-10-CM | POA: Diagnosis not present

## 2014-12-12 DIAGNOSIS — I10 Essential (primary) hypertension: Secondary | ICD-10-CM | POA: Diagnosis not present

## 2014-12-12 DIAGNOSIS — E785 Hyperlipidemia, unspecified: Secondary | ICD-10-CM | POA: Diagnosis not present

## 2014-12-12 LAB — HEMOGLOBIN A1C
HEMOGLOBIN A1C: 6.2 % — AB (ref <5.7)
Mean Plasma Glucose: 131 mg/dL — ABNORMAL HIGH (ref <117)

## 2014-12-12 LAB — CBC
HCT: 43.3 % (ref 36.0–46.0)
HEMOGLOBIN: 14.6 g/dL (ref 12.0–15.0)
MCH: 30 pg (ref 26.0–34.0)
MCHC: 33.7 g/dL (ref 30.0–36.0)
MCV: 88.9 fL (ref 78.0–100.0)
MPV: 10.5 fL (ref 8.6–12.4)
Platelets: 204 10*3/uL (ref 150–400)
RBC: 4.87 MIL/uL (ref 3.87–5.11)
RDW: 13.2 % (ref 11.5–15.5)
WBC: 7.8 10*3/uL (ref 4.0–10.5)

## 2014-12-13 LAB — COMPREHENSIVE METABOLIC PANEL
ALBUMIN: 4.2 g/dL (ref 3.6–5.1)
ALK PHOS: 71 U/L (ref 33–130)
ALT: 15 U/L (ref 6–29)
AST: 18 U/L (ref 10–35)
BILIRUBIN TOTAL: 0.4 mg/dL (ref 0.2–1.2)
BUN: 9 mg/dL (ref 7–25)
CO2: 30 mmol/L (ref 20–31)
CREATININE: 0.64 mg/dL (ref 0.50–1.05)
Calcium: 9.4 mg/dL (ref 8.6–10.4)
Chloride: 96 mmol/L — ABNORMAL LOW (ref 98–110)
Glucose, Bld: 85 mg/dL (ref 65–99)
Potassium: 3.7 mmol/L (ref 3.5–5.3)
SODIUM: 135 mmol/L (ref 135–146)
TOTAL PROTEIN: 7.3 g/dL (ref 6.1–8.1)

## 2014-12-13 LAB — LIPID PANEL
CHOL/HDL RATIO: 3.5 ratio (ref <=5.0)
CHOLESTEROL: 170 mg/dL (ref 125–200)
HDL: 48 mg/dL (ref >=46)
LDL Cholesterol: 103 mg/dL (ref <130)
TRIGLYCERIDES: 96 mg/dL (ref <150)
VLDL: 19 mg/dL (ref <30)

## 2014-12-13 LAB — TSH: TSH: 2.097 u[IU]/mL (ref 0.350–4.500)

## 2014-12-19 ENCOUNTER — Encounter: Payer: Self-pay | Admitting: Family Medicine

## 2014-12-19 ENCOUNTER — Ambulatory Visit (INDEPENDENT_AMBULATORY_CARE_PROVIDER_SITE_OTHER): Payer: Medicare Other | Admitting: Family Medicine

## 2014-12-19 ENCOUNTER — Telehealth: Payer: Self-pay | Admitting: Family Medicine

## 2014-12-19 VITALS — BP 126/80 | HR 70 | Resp 18

## 2014-12-19 DIAGNOSIS — F79 Unspecified intellectual disabilities: Secondary | ICD-10-CM

## 2014-12-19 DIAGNOSIS — Z23 Encounter for immunization: Secondary | ICD-10-CM

## 2014-12-19 DIAGNOSIS — I10 Essential (primary) hypertension: Secondary | ICD-10-CM

## 2014-12-19 DIAGNOSIS — E785 Hyperlipidemia, unspecified: Secondary | ICD-10-CM | POA: Diagnosis not present

## 2014-12-19 DIAGNOSIS — Z0000 Encounter for general adult medical examination without abnormal findings: Secondary | ICD-10-CM | POA: Diagnosis not present

## 2014-12-19 DIAGNOSIS — E559 Vitamin D deficiency, unspecified: Secondary | ICD-10-CM

## 2014-12-19 MED ORDER — PHENOBARBITAL 30 MG PO TABS: 30.0000 mg | ORAL_TABLET | Freq: Two times a day (BID) | ORAL | Status: DC

## 2014-12-19 NOTE — Telephone Encounter (Signed)
Orders faxed

## 2014-12-19 NOTE — Patient Instructions (Addendum)
Annual wellness in mid June, call if you need me sooner.  Excellent labs and exam. Scripts sent in for incontinence supplies and fanny cream  Thanks for choosing  [facility], we consider it a privelige to serve you.   All the best for 2017!  Flu vaccine today

## 2014-12-19 NOTE — Progress Notes (Signed)
   Subjective:    Patient ID: April Watson, female    DOB: 09-May-1960, 54 y.o.   MRN: [id number]  HPI Pt in for her annual exam. She is accompanied by her 2 sisters , who care for her. No concerns voiced by them as far as her physical or mental health are concerned. 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 She is cared for with love by her 3 siblings, she receives CAP asistance.     Review of Systems See HPI, history by siblings Denies recent fever or chills. Denies  nasal congestion, in or sore throat. Denies chest congestion, productive cough or wheezing. Denies  leg swelling Denies nausea, vomiting,diarrhea or constipation.   Has incontinence. Denies joint pain, swelling and has limitation in mobility Denies  seizures,  Denies  insomnia. Denies skin break down or rash.         Objective:   Physical Exam  BP 126/80 mmHg  Pulse 70  Resp 18  SpO2 96%  Patient alert  and in no cardiopulmonary distress.  HEENT: No facial asymmetry, EOMI,   oropharynx pink and moist.  Neck decreased ROM, hydrocephalus  no JVD, no mass.  Chest: Clear to auscultation bilaterally.  CVS: S1, S2 no murmurs, no S3.Regular rate.  ABD: Soft non tender.   Ext: No edema  Decreased  ROM spine, shoulders, hips and knees.  Skin: Intact, no ulcerations or rash noted.  Psych: Good eye contact,  not anxious or depressed appearing.  CNS: CN 2-12 intact, decreased power in  All 4 extremities, grade 0 to 2       Assessment & Plan:  Essential hypertension Controlled, no change in medication   Need for prophylactic vaccination and inoculation against influenza After obtaining informed consent, the vaccine is  administered by LPN.   Hyperlipidemia LDL goal <100 Hyperlipidemia:Low fat diet discussed and encouraged.   Lipid  Panel  Lab Results  Component Value Date   CHOL 170 12/12/2014   HDL 48 12/12/2014   LDLCALC 103 12/12/2014   TRIG 96 12/12/2014   CHOLHDL 3.5 12/12/2014    Controlled, no change in medication      DM (diabetes mellitus), type 2 Controlled, no change in management  Diabetic Labs Latest Ref Rng 12/12/2014 06/16/2014 11/25/2013 05/11/2013 11/17/2012  HbA1c <5.7 % 6.2(H) 6.1(H) 5.9(H) 6.1(H) 6.0(H)  Chol 125 - 200 mg/dL 170 159 149 - 160  HDL >=46 mg/dL 48 52 41 - 42  Calc LDL <130 mg/dL 103 91 89 - 99  Triglycerides <150 mg/dL 96 80 97 - 95  Creatinine 0.50 - 1.05 mg/dL 0.64 0.62 0.68 0.62 0.63   BP/Weight 12/19/2014 06/19/2014 12/06/2013 05/18/2013 11/17/2012 05/16/2012 [id number]  Systolic BP 123XX123 123XX123 [id number] 123XX123 [id number]  Diastolic BP 80 76 72 70 84 68 92  Wt. (Lbs) - - - - - - -   No flowsheet data found.       Vitamin D deficiency Continue weekly supplement

## 2014-12-19 NOTE — Telephone Encounter (Signed)
NEEDS RX FAXED FOR UNDER PADS AND PULL UPS TO FAX# [phone]

## 2014-12-23 NOTE — Assessment & Plan Note (Signed)
Hyperlipidemia:Low fat diet discussed and encouraged.   Lipid Panel  Lab Results  Component Value Date   CHOL 170 12/12/2014   HDL 48 12/12/2014   LDLCALC 103 12/12/2014   TRIG 96 12/12/2014   CHOLHDL 3.5 12/12/2014    Controlled, no change in medication

## 2014-12-23 NOTE — Assessment & Plan Note (Signed)
Controlled, no change in management  Diabetic Labs Latest Ref Rng 12/12/2014 06/16/2014 11/25/2013 05/11/2013 11/17/2012  HbA1c <5.7 % 6.2(H) 6.1(H) 5.9(H) 6.1(H) 6.0(H)  Chol 125 - 200 mg/dL 170 159 149 - 160  HDL >=46 mg/dL 48 52 41 - 42  Calc LDL <130 mg/dL 103 91 89 - 99  Triglycerides <150 mg/dL 96 80 97 - 95  Creatinine 0.50 - 1.05 mg/dL 0.64 0.62 0.68 0.62 0.63   BP/Weight 12/19/2014 06/19/2014 12/06/2013 05/18/2013 11/17/2012 05/16/2012 [id number]  Systolic BP 123XX123 123XX123 [id number] [id number]  Diastolic BP 80 76 72 70 84 68 92  Wt. (Lbs) - - - - - - -   No flowsheet data found.

## 2014-12-23 NOTE — Assessment & Plan Note (Signed)
Continue weekly supplement 

## 2014-12-23 NOTE — Assessment & Plan Note (Signed)
Controlled, no change in medication  

## 2014-12-23 NOTE — Assessment & Plan Note (Signed)
After obtaining informed consent, the vaccine is  administered by LPN.  

## 2014-12-24 ENCOUNTER — Other Ambulatory Visit: Payer: Self-pay | Admitting: Family Medicine

## 2015-02-04 ENCOUNTER — Other Ambulatory Visit: Payer: Self-pay | Admitting: Family Medicine

## 2015-04-02 ENCOUNTER — Other Ambulatory Visit: Payer: Self-pay | Admitting: Family Medicine

## 2015-05-16 ENCOUNTER — Other Ambulatory Visit: Payer: Self-pay | Admitting: Family Medicine

## 2015-05-22 ENCOUNTER — Telehealth: Payer: Self-pay | Admitting: Family Medicine

## 2015-05-22 NOTE — Telephone Encounter (Signed)
[name] is calling on behalf of April Watson stating that she has had a place to come up on her right foot that Dr. Moshe Cipro possibly needs to look at, no availability on the schedule please advise?

## 2015-05-22 NOTE — Telephone Encounter (Signed)
Opened chart in error.

## 2015-05-23 NOTE — Telephone Encounter (Signed)
Patient has reddened area on foot.  Area not open or draining.  No warmth to skin.  Please advise.   Will see when [facility] is due to see patient to see if nurse can visualize.

## 2015-05-23 NOTE — Telephone Encounter (Signed)
Caretaker aware of advice.  Appointment made for follow up that will be cancelled by caretaker if needed.

## 2015-05-23 NOTE — Telephone Encounter (Signed)
Advise use oTC antibiotic oint twice daily for next 5 days, if persists, call back for podiatry to see

## 2015-06-03 ENCOUNTER — Other Ambulatory Visit: Payer: Self-pay

## 2015-06-03 ENCOUNTER — Ambulatory Visit: Payer: Medicare Other | Admitting: Family Medicine

## 2015-06-03 MED ORDER — POTASSIUM CHLORIDE ER 10 MEQ PO TBCR: EXTENDED_RELEASE_TABLET | ORAL | Status: DC

## 2015-06-03 MED ORDER — POLYETHYLENE GLYCOL 3350 17 GM/SCOOP PO POWD: ORAL | Status: DC

## 2015-06-03 MED ORDER — VITAMIN D (ERGOCALCIFEROL) 1.25 MG (50000 UNIT) PO CAPS: ORAL_CAPSULE | ORAL | Status: DC

## 2015-06-04 ENCOUNTER — Ambulatory Visit: Payer: Medicare Other | Admitting: Family Medicine

## 2015-06-05 ENCOUNTER — Encounter: Payer: Self-pay | Admitting: Family Medicine

## 2015-06-05 ENCOUNTER — Ambulatory Visit (INDEPENDENT_AMBULATORY_CARE_PROVIDER_SITE_OTHER): Payer: Medicare Other | Admitting: Family Medicine

## 2015-06-05 VITALS — BP 106/78 | HR 60 | Resp 18

## 2015-06-05 DIAGNOSIS — B356 Tinea cruris: Secondary | ICD-10-CM | POA: Insufficient documentation

## 2015-06-05 DIAGNOSIS — B369 Superficial mycosis, unspecified: Secondary | ICD-10-CM | POA: Insufficient documentation

## 2015-06-05 DIAGNOSIS — I10 Essential (primary) hypertension: Secondary | ICD-10-CM

## 2015-06-05 DIAGNOSIS — B372 Candidiasis of skin and nail: Secondary | ICD-10-CM | POA: Diagnosis not present

## 2015-06-05 DIAGNOSIS — B3789 Other sites of candidiasis: Secondary | ICD-10-CM

## 2015-06-05 DIAGNOSIS — L89612 Pressure ulcer of right heel, stage 2: Secondary | ICD-10-CM | POA: Diagnosis not present

## 2015-06-05 DIAGNOSIS — K5909 Other constipation: Secondary | ICD-10-CM

## 2015-06-05 DIAGNOSIS — L89619 Pressure ulcer of right heel, unspecified stage: Secondary | ICD-10-CM | POA: Insufficient documentation

## 2015-06-05 DIAGNOSIS — E785 Hyperlipidemia, unspecified: Secondary | ICD-10-CM

## 2015-06-05 DIAGNOSIS — Q039 Congenital hydrocephalus, unspecified: Secondary | ICD-10-CM

## 2015-06-05 MED ORDER — FLUCONAZOLE 150 MG PO TABS: ORAL_TABLET | ORAL | Status: DC

## 2015-06-05 MED ORDER — NYSTATIN 100000 UNIT/GM EX POWD: Freq: Four times a day (QID) | CUTANEOUS | Status: DC

## 2015-06-05 MED ORDER — TERBINAFINE HCL 250 MG PO TABS: 250.0000 mg | ORAL_TABLET | Freq: Every day | ORAL | Status: DC

## 2015-06-05 NOTE — Assessment & Plan Note (Signed)
Current flare of candidiasis of lower abdominal folds, topical mycostatin ad fluconazole x 1 tablet followed by daily cornstarch

## 2015-06-05 NOTE — Assessment & Plan Note (Signed)
Hyperlipidemia:Low fat diet discussed and encouraged.   Lipid Panel  Lab Results  Component Value Date   CHOL 170 12/12/2014   HDL 48 12/12/2014   LDLCALC 103 12/12/2014   TRIG 96 12/12/2014   CHOLHDL 3.5 12/12/2014      Updated lab needed at/ before next visit.

## 2015-06-05 NOTE — Assessment & Plan Note (Signed)
Controlled, no change in medication  

## 2015-06-05 NOTE — Patient Instructions (Signed)
F/u in June as before, call if you need me sooner  You are referred urgently to the wound cklinic re pressur sore, keep clean , dry and protected  Two different tablets sent for fungal and yeast infection in groin, also powder to be used (mycosatin). Once cleared use corn starch or drying powder like desitin to keep area dry

## 2015-06-05 NOTE — Progress Notes (Signed)
   Subjective:    Patient ID: April Watson, female    DOB: 02-22-1960, 55 y.o.   MRN: [id number]  HPI Approximate 2 week h/o dark are TO right heel, increasing in size, no drainage, no fever, appetite and behavior unchanged. Caregivers have been using topical neosporin and keeping area covered loosely also foot is in soft shoe Increased redness and rash noted in lower abdominal fold in past 1 week, no drainage Appetite and BM unchnaged    Review of Systems See HPI, history is from her sisters who care for her. Denies recent fever or chills. Denies sinus pressure, nasal congestion, ear pain or sore throat. Denies chest congestion, productive cough or wheezing. Denies  leg swelling Denies abdominal pain, nausea, vomiting,diarrhea  C/o chronic immobility due nto CP        Objective:   Physical Exam BP 106/78 mmHg  Pulse 60  Resp 18  Ht   Wt   SpO2 100% Patient alert and oriented and in no cardiopulmonary distress.  HEENT:Hydrocephalus, No facial asymmetry, EOMI,   oropharynx pink and moist.  Neck decreased ROM, no  JVD, no mass.  Chest: Clear to auscultation bilaterally.  CVS: S1, S2 no murmurs, no S3.Regular rate.  ABD: Soft non tender.   Ext: No edema  MS: decreased  ROM spine, shoulders, hips and knees.  Skin: necrotic pressure ulcer on right heel, maximum diameter approx 4cm, erythema and scaling rash on lower abdominal fold  Psych: Good eye contact, .not anxious or depressed appearing  Cut.no focal deficits noted.        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   Lipid Panel  Lab Results  Component Value Date   CHOL 170 12/12/2014   HDL 48 12/12/2014   LDLCALC 103 12/12/2014   TRIG 96 12/12/2014   CHOLHDL 3.5 12/12/2014      Updated lab needed at/ before next visit.

## 2015-06-05 NOTE — Assessment & Plan Note (Signed)
2 week h/o worsening ulcer on heel , chronic pressure applied to area as pot is wheelchair bound , no bacterial superinfection noted CLeaned with saline, tetracycline to area, loose dry gauze applied, needs urgent wound clinic appt, needs debridement

## 2015-06-05 NOTE — Assessment & Plan Note (Signed)
Unchnahged, pt is chronically quadriplegic with limited extremity mobility, contractures and at high risk for skin breakdown

## 2015-06-05 NOTE — Assessment & Plan Note (Signed)
Keep abdomen  Dry and oral terbinafine x 2 weeks

## 2015-06-19 DIAGNOSIS — L89612 Pressure ulcer of right heel, stage 2: Secondary | ICD-10-CM | POA: Diagnosis not present

## 2015-06-19 DIAGNOSIS — Q039 Congenital hydrocephalus, unspecified: Secondary | ICD-10-CM | POA: Diagnosis not present

## 2015-06-19 DIAGNOSIS — I10 Essential (primary) hypertension: Secondary | ICD-10-CM | POA: Diagnosis not present

## 2015-06-19 DIAGNOSIS — E785 Hyperlipidemia, unspecified: Secondary | ICD-10-CM | POA: Diagnosis not present

## 2015-06-19 DIAGNOSIS — Z79899 Other long term (current) drug therapy: Secondary | ICD-10-CM | POA: Diagnosis not present

## 2015-06-20 ENCOUNTER — Other Ambulatory Visit: Payer: Self-pay

## 2015-06-20 DIAGNOSIS — F79 Unspecified intellectual disabilities: Secondary | ICD-10-CM

## 2015-06-20 MED ORDER — PHENOBARBITAL 30 MG PO TABS: 30.0000 mg | ORAL_TABLET | Freq: Two times a day (BID) | ORAL | Status: DC

## 2015-07-03 ENCOUNTER — Encounter: Payer: Medicare Other | Admitting: Family Medicine

## 2015-08-05 ENCOUNTER — Telehealth: Payer: Self-pay | Admitting: Family Medicine

## 2015-08-05 MED ORDER — BENZONATATE 100 MG PO CAPS: 100.0000 mg | ORAL_CAPSULE | Freq: Two times a day (BID) | ORAL | Status: AC | PRN

## 2015-08-05 MED ORDER — AZITHROMYCIN 250 MG PO TABS: ORAL_TABLET | ORAL | Status: AC

## 2015-08-05 NOTE — Telephone Encounter (Signed)
Spoke with sister.  Patient has non-productive cough x 2 days.  No fever.   They have given her Robitussin DM and [place] without relief.  Please advise.

## 2015-08-05 NOTE — Telephone Encounter (Signed)
Z pack and tessalon perles prescribed °

## 2015-08-05 NOTE — Telephone Encounter (Signed)
Sister aware.  

## 2015-08-05 NOTE — Telephone Encounter (Signed)
April Watson is stating that [name] has started couging real bad, symptoms since Saturday, like she has a cold. She is asking if Dr. Moshe Cipro would call her in something, please advise?

## 2015-08-22 ENCOUNTER — Ambulatory Visit (INDEPENDENT_AMBULATORY_CARE_PROVIDER_SITE_OTHER): Payer: Medicare Other | Admitting: Family Medicine

## 2015-08-22 ENCOUNTER — Encounter: Payer: Self-pay | Admitting: Family Medicine

## 2015-08-22 VITALS — BP 160/84 | HR 86 | Resp 18

## 2015-08-22 DIAGNOSIS — L8990 Pressure ulcer of unspecified site, unspecified stage: Secondary | ICD-10-CM | POA: Diagnosis not present

## 2015-08-22 DIAGNOSIS — Q039 Congenital hydrocephalus, unspecified: Secondary | ICD-10-CM

## 2015-08-22 DIAGNOSIS — L89152 Pressure ulcer of sacral region, stage 2: Secondary | ICD-10-CM | POA: Insufficient documentation

## 2015-08-22 DIAGNOSIS — L89159 Pressure ulcer of sacral region, unspecified stage: Secondary | ICD-10-CM | POA: Diagnosis not present

## 2015-08-22 DIAGNOSIS — L89151 Pressure ulcer of sacral region, stage 1: Secondary | ICD-10-CM | POA: Insufficient documentation

## 2015-08-22 NOTE — Assessment & Plan Note (Signed)
Prolonged immobility and contracture putting pt at increased risk of skin breakdown

## 2015-08-22 NOTE — Assessment & Plan Note (Signed)
Week h/o skin breakdown, increasing in size, by history up to 2.5 to 3 cm diameter, no drainage note.  Will refer to home health and [facility] for in home support and management , with close tele f/u, wound clinic will be involved only if necessary. Barrier to skin to remain the same  Script for seat cushion and wedge for bed

## 2015-08-22 NOTE — Progress Notes (Signed)
   April Watson     MRN: [id number]      DOB: 08/30/1960   HPI April Watson is here with a 3 week h/o skin breakdown in buttock area, no h/o fever , chills or purulent drainage. Pt reportedly not in obvious pain.No change in appetite. Needs chair cushion and also wedge for bed to take pressure off area Unfortunately unable to visualize because of mobility issues and contractures  ROS Denies recent fever or chills. Denies sinus pressure, nasal congestion, ear pain or sore throat. Denies chest congestion, productive cough or wheezing. Denies chest pains, palpitations and leg swelling Denies abdominal pain, nausea, vomiting,diarrhea or constipation.   Denies dysuria, frequency, hesitancy or incontinence.  PE  BP (!) 160/84   Pulse 86   Resp 18   SpO2 98%   Patient alert and oriented and in no cardiopulmonary distress.  HEENT: No facial asymmetry, EOMI,   oropharynx pink and moist.  Neck supple no JVD, no mass.  Chest: Clear to auscultation bilaterally.    ABD: Soft non tender.   Ext: No edema  MS: Adequate ROM spine, shoulders, hips and knees.     Assessment & Plan  Sacral pressure ulcer Week h/o skin breakdown, increasing in size, by history up to 2.5 to 3 cm diameter, no drainage note.  Will refer to home health and [facility] for in home support and management , with close tele f/u, wound clinic will be involved only if necessary. Barrier to skin to remain the same  Script for seat cushion and wedge for bed  CONGENITAL HYDROCEPHALUS Prolonged immobility and contracture putting pt at increased risk of skin breakdown

## 2015-08-22 NOTE — Patient Instructions (Addendum)
Change wellness to mid September, call if you need me before  New barrier medication sent for ulcer  Home health nurse and [facility] to be involved in monitoring  the sore , I will be in close contact and supervising management.  Referral to Dr Nils Pyle will be made if necessary  Family PLEASE call wit concerns  Attempt will be made to get more protective mattress covering to reduce recurrent sores

## 2015-08-23 ENCOUNTER — Encounter: Payer: Self-pay | Admitting: *Deleted

## 2015-08-23 ENCOUNTER — Other Ambulatory Visit: Payer: Self-pay | Admitting: *Deleted

## 2015-08-23 NOTE — Patient Outreach (Signed)
[facility]) Care Management  08/23/2015  April Watson 06-Nov-1960 [id number]   New patient referral from Dr. Tula Nakayama who asked [facility] to become involved in April Watson's care to assist with care coordination needs oversight of skin care concerns. April Watson has history of mental retardation, congenital hydrocephalus, hyperlipidemia, diabetes mellitus type II, and skin breakdown of the sacrum.   I reached out to and spoke with April Watson who is sister, HCPOA, and primary caregiver for April Watson. April Watson related to me that she spoke with [place] yesterday who reported they would send a nurse to the home for an initial visit on Saturday 08/24/15. I provided April Watson with the direct contact number for [place] so she could request a particular time frame for the visit as the family has plans tomorrow evening.   I explained [facility] services to April Watson and she agreed to and expressed gratitude for the additional help and support. April Watson a greed to allow me to call again upon my return in two weeks and plan a face to face visit with April Watson. In my absence next week, April Amor RN, BSN will provide coverage for any needs that may arise for April Watson. The contact information was provide to April Watson today.   Plan: I will call April Watson the week of 09/02/15.    [place] [facility]  [phone]

## 2015-08-24 DIAGNOSIS — L89152 Pressure ulcer of sacral region, stage 2: Secondary | ICD-10-CM | POA: Diagnosis not present

## 2015-08-24 DIAGNOSIS — Q039 Congenital hydrocephalus, unspecified: Secondary | ICD-10-CM | POA: Diagnosis not present

## 2015-08-27 ENCOUNTER — Telehealth: Payer: Self-pay

## 2015-08-27 DIAGNOSIS — Q039 Congenital hydrocephalus, unspecified: Secondary | ICD-10-CM | POA: Diagnosis not present

## 2015-08-27 DIAGNOSIS — L89152 Pressure ulcer of sacral region, stage 2: Secondary | ICD-10-CM | POA: Diagnosis not present

## 2015-08-27 NOTE — Telephone Encounter (Signed)
pls call for compopund cream and send in to the pharmacy, thanks

## 2015-08-27 NOTE — Telephone Encounter (Signed)
Cream [facility] sent [facility] for compound cream

## 2015-08-28 DIAGNOSIS — L89152 Pressure ulcer of sacral region, stage 2: Secondary | ICD-10-CM | POA: Diagnosis not present

## 2015-08-28 DIAGNOSIS — Q039 Congenital hydrocephalus, unspecified: Secondary | ICD-10-CM | POA: Diagnosis not present

## 2015-08-29 ENCOUNTER — Other Ambulatory Visit: Payer: Self-pay

## 2015-08-29 MED ORDER — POTASSIUM CHLORIDE CRYS ER 10 MEQ PO TBCR: EXTENDED_RELEASE_TABLET | ORAL | 6 refills | Status: DC

## 2015-08-29 MED ORDER — VITAMIN D (ERGOCALCIFEROL) 1.25 MG (50000 UNIT) PO CAPS: ORAL_CAPSULE | ORAL | 5 refills | Status: DC

## 2015-08-30 DIAGNOSIS — Q039 Congenital hydrocephalus, unspecified: Secondary | ICD-10-CM | POA: Diagnosis not present

## 2015-08-30 DIAGNOSIS — L89152 Pressure ulcer of sacral region, stage 2: Secondary | ICD-10-CM | POA: Diagnosis not present

## 2015-09-03 DIAGNOSIS — Q039 Congenital hydrocephalus, unspecified: Secondary | ICD-10-CM | POA: Diagnosis not present

## 2015-09-03 DIAGNOSIS — L89152 Pressure ulcer of sacral region, stage 2: Secondary | ICD-10-CM | POA: Diagnosis not present

## 2015-09-04 ENCOUNTER — Encounter: Payer: Self-pay | Admitting: *Deleted

## 2015-09-04 ENCOUNTER — Other Ambulatory Visit: Payer: Self-pay | Admitting: *Deleted

## 2015-09-04 DIAGNOSIS — Q039 Congenital hydrocephalus, unspecified: Secondary | ICD-10-CM | POA: Diagnosis not present

## 2015-09-04 DIAGNOSIS — L89152 Pressure ulcer of sacral region, stage 2: Secondary | ICD-10-CM | POA: Diagnosis not present

## 2015-09-04 NOTE — Patient Outreach (Signed)
[facility]) Care Management  09/04/2015  [name] August 23, 1960 [id number]  April Watson was referred to [facility] [facility] by her primary care provider, Dr. Tula Nakayama, for assistance with care coordination needs and oversight of skin care concerns. April Watson has history of mental retardation, congenital hydrocephalus, hyperlipidemia, diabetes mellitus type II, and skin breakdown of the sacrum.   I spoke again today with April Watson who is sister, HCPOA, and primary caregiver for Ms. April Watson. Ms. Dorner related to me that home health services are in place and the nurse is visiting twice weekly. April Watson is awaiting delivery of a "gel cushion" that she understands was ordered for her sister but she isn't sure from whom the cushion was ordered. I reached out to Dr. Griffin Dakin office to inquire about origin and destination of order and will follow up once this information is received.   Per AprilApril Watson, April Watson is improving. She is eating according to her regular schedule and is not perceived to be uncomfortable.   Plan: I will follow up regarding DME and will plan a face to face visit with April Watson towards the end of the home health visit period.    [facility]  [phone]

## 2015-09-05 ENCOUNTER — Other Ambulatory Visit: Payer: Self-pay | Admitting: *Deleted

## 2015-09-05 ENCOUNTER — Encounter: Payer: Medicare Other | Admitting: Family Medicine

## 2015-09-05 NOTE — Patient Outreach (Signed)
[facility]) Care Management  09/05/2015  April Watson Jun 26, 1960 [id number]   Message left as requested for Ms. April Watson indicating that order for gel cushion was faxed to CAP worker and requesting return call if CAP worker has not received or if new fax needed.   Plan: I have scheduled a follow up call with [name] for next week.    [place] [facility]  [phone]

## 2015-09-06 ENCOUNTER — Other Ambulatory Visit: Payer: Self-pay | Admitting: *Deleted

## 2015-09-06 DIAGNOSIS — L89152 Pressure ulcer of sacral region, stage 2: Secondary | ICD-10-CM | POA: Diagnosis not present

## 2015-09-06 DIAGNOSIS — Q039 Congenital hydrocephalus, unspecified: Secondary | ICD-10-CM | POA: Diagnosis not present

## 2015-09-06 NOTE — Patient Outreach (Signed)
[facility]) Care Management  09/06/2015  MORENIKE PERCELL 07/19/1960 [id number]   Call received from Ms. Dema Severin regarding gel cushion ordered for her sister Miss Zaileigh Greenan. Ms. Paw Carbine has been unable to reach Miss Theurer's CAP worker Jacqualin Combes @ [phone] to whom an order for the gel cushion was faxed by Dr. Griffin Dakin office nurse. I was also unable to reach Ms. Akins by phone and reached out to [facility] LPN @ Dr. Joycelyn Schmid Simpson's office and asked if she might help [place] trace contact with the CAP organization supervisor to follow up on the order.   Plan: I will resume follow up on Monday.    [place] Management  [phone]

## 2015-09-08 DIAGNOSIS — L89152 Pressure ulcer of sacral region, stage 2: Secondary | ICD-10-CM | POA: Diagnosis not present

## 2015-09-08 DIAGNOSIS — Q039 Congenital hydrocephalus, unspecified: Secondary | ICD-10-CM | POA: Diagnosis not present

## 2015-09-09 ENCOUNTER — Encounter: Payer: Self-pay | Admitting: *Deleted

## 2015-09-09 ENCOUNTER — Other Ambulatory Visit: Payer: Self-pay | Admitting: *Deleted

## 2015-09-09 NOTE — Patient Outreach (Signed)
[facility]) Care Management  09/09/2015  DESYREE MEIDINGER 02/01/1960 [id number]   Ms. Dema Severin, sister and primary caregiver/HCPOA for Miss Rhiana Macedo hasn't received any communication about Miss Hetz's ordered DME. I was unable to reach Ms. Renee Rival (CAP/ADTS) @ [phone]. Denman George LPN @ Dr. Griffin Dakin office offered to fax orders to [facility]. Ms. Mushtaq Shramek is aware and if she does not hear from [place] will call me for further assistance.   Plan: I will follow up with Miss Laurance Flatten by telephone next week. When home care services are completed, I will schedule a face to face visit with Miss Laurance Flatten.    [place] Management  [phone]

## 2015-09-10 NOTE — Telephone Encounter (Signed)
This encounter was created in error - please disregard.

## 2015-09-11 DIAGNOSIS — Q039 Congenital hydrocephalus, unspecified: Secondary | ICD-10-CM | POA: Diagnosis not present

## 2015-09-11 DIAGNOSIS — L89152 Pressure ulcer of sacral region, stage 2: Secondary | ICD-10-CM | POA: Diagnosis not present

## 2015-09-17 ENCOUNTER — Other Ambulatory Visit: Payer: Self-pay | Admitting: *Deleted

## 2015-09-17 NOTE — Patient Outreach (Addendum)
[facility]) Care Management  09/17/2015  LANAY BATTIE [birth year] [id number]   I called the home of Miss Kilynn Fiumara today and was able to speak with her CNA who told me that Ms. Dema Severin, sister/HCPOA/primary caregiver was not available. I was given Ms. [name] Renaldo's cell phone number and left a HIPPA compliant message with her, requesting a return call. I plan to speak with Ms. Liddell regarding the ordered DME (set for pick up last week) and to see if we can schedule a face to face visit time for next week.   Plan: I will return a call to Ms. Adney later this week.   Addendum: I received a return call from Ms.Dema Severin who states that Miss Tashauna Ruman received her gel cushion for the wheelchair. Ms. Tali Havens also was able to look at what is available for turning wedges and said that she really doesn't see anything that she feels will help for what they need. Ms. Jewelianna Braum says the home health nurse is coming twice weekly and is performing dressing changes. At this time, she would like for me to continue to call and check in but doesn't think she is ready for another visiting nurse. I explained that I can begin visits when the home health agency visits near their completion if this will be of help to [facility].   [place] Management  [phone]

## 2015-09-18 DIAGNOSIS — Q039 Congenital hydrocephalus, unspecified: Secondary | ICD-10-CM | POA: Diagnosis not present

## 2015-09-18 DIAGNOSIS — L89152 Pressure ulcer of sacral region, stage 2: Secondary | ICD-10-CM | POA: Diagnosis not present

## 2015-09-19 ENCOUNTER — Ambulatory Visit: Payer: Medicare Other | Admitting: *Deleted

## 2015-09-20 DIAGNOSIS — Q039 Congenital hydrocephalus, unspecified: Secondary | ICD-10-CM | POA: Diagnosis not present

## 2015-09-20 DIAGNOSIS — L89152 Pressure ulcer of sacral region, stage 2: Secondary | ICD-10-CM | POA: Diagnosis not present

## 2015-09-24 DIAGNOSIS — L89152 Pressure ulcer of sacral region, stage 2: Secondary | ICD-10-CM | POA: Diagnosis not present

## 2015-09-24 DIAGNOSIS — Q039 Congenital hydrocephalus, unspecified: Secondary | ICD-10-CM | POA: Diagnosis not present

## 2015-09-26 ENCOUNTER — Other Ambulatory Visit: Payer: Self-pay | Admitting: *Deleted

## 2015-09-26 ENCOUNTER — Encounter: Payer: Self-pay | Admitting: *Deleted

## 2015-09-26 NOTE — Patient Outreach (Signed)
[facility]) Care Management  09/26/2015  April Watson 11/20/1960 [id number]  April Watson is a 55 year old female patient living in [place] with her sister. April Watson has history of congenital hydrocephalus, hypertension, hyperlipidemia, chronic constipation, and various intermittent chronic skin conditions such as candidiasis and fungal infections in addition to intermittent skin breakdown with wounds despite exceptional care by family and caregivers.   April Watson was referred to [place] Management by her primary care provider, Dr. Tula Nakayama, for assistance with care coordination needs and oversight of skin care concerns. April Watson is receiving home health nursing services for twice weekly assessments and dressing changes. [facility] Care Management has been assisting with care coordination around DME needs (gel cushion for wheelchair, turning wedge).   April Watson's guardian and primary caregiver is her sister April Watson. Ms. Watson and I spoke and discussed April Watson's needs again today. At this time, she would like for me to continue to call and check in but doesn't think she is ready for another visiting nurse. I explained that I can begin visits when the home health agency visits near their completion if this will be of help to April Watson.  Plan: I will reach out to Ms. Mcguirt again by phone and will plan a face to face/home visit in the next 2 weeks.    [place] Management  [phone]

## 2015-09-27 ENCOUNTER — Other Ambulatory Visit: Payer: Self-pay | Admitting: Family Medicine

## 2015-10-01 DIAGNOSIS — Q039 Congenital hydrocephalus, unspecified: Secondary | ICD-10-CM | POA: Diagnosis not present

## 2015-10-01 DIAGNOSIS — L89152 Pressure ulcer of sacral region, stage 2: Secondary | ICD-10-CM | POA: Diagnosis not present

## 2015-10-03 ENCOUNTER — Encounter: Payer: Self-pay | Admitting: *Deleted

## 2015-10-03 ENCOUNTER — Other Ambulatory Visit: Payer: Self-pay | Admitting: *Deleted

## 2015-10-03 NOTE — Patient Outreach (Signed)
[facility]) Care Management  10/03/2015  SHANNAN CAUGHEY October 20, 1960 [id number]   April April Watson was referred to [place] Management by her primary care provider, Dr. Tula Nakayama, for assistance with care coordination needs and oversight of skin care concerns. April Watson has history of mental retardation, congenital hydrocephalus, hyperlipidemia, diabetes mellitus type II, and skin breakdown of the sacrum.   I have been following April April Watson and collaborating with her sister, Ms. April Watson who is primary caregiver and HCPOA. April Watson also has in home caregivers (CAP) who are very knowledgeable and involved in April Watson's care. We have worked on DME needs and have been able to get a gel cushion for April Watson wheelchair. April Watson is also enjoying visits from her home health nurse twice weekly who is performing dressing changes.   April Watson says that at this time, she would like for me to continue to call and check in but would like to wait for completion of home health nursing services before I schedule a visit.   Plan: I will follow up with April April Watson by phone and will schedule in person visit nearing completion of home health visits.    [place] Management  [phone]

## 2015-10-09 ENCOUNTER — Ambulatory Visit (INDEPENDENT_AMBULATORY_CARE_PROVIDER_SITE_OTHER): Payer: Medicare Other

## 2015-10-09 VITALS — BP 124/70 | HR 76 | Resp 18

## 2015-10-09 DIAGNOSIS — Z23 Encounter for immunization: Secondary | ICD-10-CM

## 2015-10-09 DIAGNOSIS — Z0000 Encounter for general adult medical examination without abnormal findings: Secondary | ICD-10-CM | POA: Diagnosis not present

## 2015-10-09 NOTE — Progress Notes (Signed)
Subjective:    April Watson is a 55 y.o. female who presents for Medicare Annual/Subsequent preventive examination.  Preventive Screening-Counseling & Management  Tobacco History  Smoking Status  . Never Smoker  Smokeless Tobacco  . Not on file     Current Problems (verified) Patient Active Problem List   Diagnosis Date Noted  . Sacral pressure ulcer 08/22/2015  . Pressure ulcer of right heel 06/05/2015  . Tinea cruris 06/05/2015  . Fungal infection of skin of abdomen 06/05/2015  . Vitamin D deficiency 01/21/2014  . Skin candidiasis 12/06/2013  . 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 Family history of colon cancer 05/19/2011  . DM (diabetes mellitus), type 2 ([place]) 12/21/2010  . COLONIC POLYPS, ADENOMATOUS, HX OF 11/30/2008  . GERD 11/29/2008  . Hyperlipidemia LDL goal <100 08/17/2007  . Mental retardation 08/17/2007  . Essential hypertension 08/17/2007  . CONSTIPATION, CHRONIC 08/17/2007  . 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 Convulsions ([place]) 08/17/2007    Medications Prior to Visit Current Outpatient Prescriptions on File Prior to Visit  Medication Sig Dispense Refill  . bisoprolol-hydrochlorothiazide (ZIAC) 5-6.25 MG tablet TAKE (1) TABLET BY MOUTH ONCE DAILY FOR BLOOD PRESSURE. 30 tablet 3  . clotrimazole-betamethasone (LOTRISONE) cream Apply twice daily to affected area x 1 week and then as needed 45 g 1  . docusate sodium (STOOL SOFTENER) 100 MG capsule TAKE 1 CAPSULE TWICE DAILY FOR CONSTIPATION. 100 capsule 6  . nystatin (MYCOSTATIN) powder Apply topically 4 (four) times daily. Apply three times daily to rash in groin for 1 week, then as needed 56.7 g 2  . PHENObarbital (LUMINAL) 30 MG tablet Take 1 tablet (30 mg total) by mouth 2 (two) times daily. 60 tablet 5  . polyethylene glycol powder (GLYCOLAX/MIRALAX) powder MIX 1 CAPFUL (17 G) IN 8 OUNCES OF WATER/JUICE AND DRINK ONCE DAILY FOR CONSTIPATION. 527 g 2  . potassium chloride (K-DUR,KLOR-CON) 10 MEQ tablet  TAKE (1) TABLET BY MOUTH ONCE DAILY WITH FOOD. 30 tablet 6  . ranitidine (ZANTAC) 150 MG tablet TAKE 1 TABLET BY MOUTH ONCE A DAY AT BEDTIME. 30 tablet 3  . simvastatin (ZOCOR) 10 MG tablet TAKE (1) TABLET BY MOUTH AT BEDTIME FOR CHOLESTEROL. 30 tablet 3  . terbinafine (LAMISIL) 250 MG tablet Take 1 tablet (250 mg total) by mouth daily. 10 tablet 0  . Vitamin D, Ergocalciferol, (DRISDOL) 50000 units CAPS capsule TAKE 1 CAPSULE BY MOUTH ONCE WEEKLY. 4 capsule 5   No current facility-administered medications on file prior to visit.     Current Medications (verified) Current Outpatient Prescriptions  Medication Sig Dispense Refill  . bisoprolol-hydrochlorothiazide (ZIAC) 5-6.25 MG tablet TAKE (1) TABLET BY MOUTH ONCE DAILY FOR BLOOD PRESSURE. 30 tablet 3  . clotrimazole-betamethasone (LOTRISONE) cream Apply twice daily to affected area x 1 week and then as needed 45 g 1  . docusate sodium (STOOL SOFTENER) 100 MG capsule TAKE 1 CAPSULE TWICE DAILY FOR CONSTIPATION. 100 capsule 6  . nystatin (MYCOSTATIN) powder Apply topically 4 (four) times daily. Apply three times daily to rash in groin for 1 week, then as needed 56.7 g 2  . PHENObarbital (LUMINAL) 30 MG tablet Take 1 tablet (30 mg total) by mouth 2 (two) times daily. 60 tablet 5  . polyethylene glycol powder (GLYCOLAX/MIRALAX) powder MIX 1 CAPFUL (17 G) IN 8 OUNCES OF WATER/JUICE AND DRINK ONCE DAILY FOR CONSTIPATION. 527 g 2  . potassium chloride (K-DUR,KLOR-CON) 10 MEQ tablet TAKE (1) TABLET BY MOUTH ONCE DAILY WITH FOOD. 30 tablet 6  .  ranitidine (ZANTAC) 150 MG tablet TAKE 1 TABLET BY MOUTH ONCE A DAY AT BEDTIME. 30 tablet 3  . simvastatin (ZOCOR) 10 MG tablet TAKE (1) TABLET BY MOUTH AT BEDTIME FOR CHOLESTEROL. 30 tablet 3  . terbinafine (LAMISIL) 250 MG tablet Take 1 tablet (250 mg total) by mouth daily. 10 tablet 0  . Vitamin D, Ergocalciferol, (DRISDOL) 50000 units CAPS capsule TAKE 1 CAPSULE BY MOUTH ONCE WEEKLY. 4 capsule 5   No  current facility-administered medications for this visit.      Allergies (verified) Review of patient's allergies indicates no known allergies.   PAST HISTORY  Family History Family History  Problem Relation Age of Onset  . Kidney failure Mother   . Diabetes Mother   . Hypertension Mother   . Kidney disease Mother   . Prostate cancer Father   . Hypertension Father   . Cancer Father   . Colon cancer Sister 72  . Colon cancer      family history   . Lymphoma      family history   . Hypertension      family history     Social History Social History  Substance Use Topics  . Smoking status: Never Smoker  . Smokeless tobacco: Not on file  . Alcohol use No     Are there smokers in your home (other than you)? No  Risk Factors Current exercise habits: The patient does not participate in regular exercise at present.  Dietary issues discussed: Meals prepared at home by family change in nutritional labels discussed    Cardiac risk factors: hypertension, obesity (BMI >= 30 kg/m2) and sedentary lifestyle.  Depression Screen (Note: if answer to either of the following is "Yes", a more complete depression screening is indicated)   Over the past two weeks, have you felt down, depressed or hopeless? No  Over the past two weeks, have you felt little interest or pleasure in doing things? No  Have you lost interest or pleasure in daily life? No  Do you often feel hopeless? No  Do you cry easily over simple problems? No  Activities of Daily Living In your present state of health, do you have any difficulty performing the following activities?:  Driving? Yes Managing money?  Yes Feeding yourself? Yes Getting from bed to chair? YesNo exam performed today, annual wellness visit without physical exam . Climbing a flight of stairs? Yes Preparing food and eating?: Yes Bathing or showering? Yes Getting dressed: Yes Getting to the toilet? Yes Using the toilet:Yes Moving around from  place to place: Yes In the past year have you fallen or had a near fall?:No   Are you sexually active?  No  Do you have more than one partner?  No  Hearing Difficulties: No, unable to accurately assess Do you often ask people to speak up or repeat themselves? No, N/A Do you experience ringing or noises in your ears? No, unknown Do you have difficulty understanding soft or whispered voices? No, N/A, unable to assess and pt incapable of providing history   Do you feel that you have a problem with memory? No, unable to assess  Do you often misplace items? NoN/A  Do you feel safe at home?  Yes  Cognitive Testing  Alert? Yes  Normal Appearance?Yes, hydrocephaly  Oriented to person? Yes  Place? Yes   Time? No, unable to assess orientation, has expressive aphasia  Recall of three objects?  No  Can perform simple calculations? No  Displays  appropriate judgment?No  Can read the correct time from a watch face?No   Advanced Directives have been discussed with the patient? Yes  List the Names of Other Physician/Practitioners you currently use: 1.  [employer]- DME supplier 2. [place] any recent Medical Services you may have received from other than [facility] providers in the past year (date may be approximate).  Immunization History  Administered Date(s) Administered  . H1N1 12/23/2007, 02/22/2008  . Influenza Split 10/24/2010, 11/16/2011, 11/04/2013  . Influenza Whole 10/18/2009  . Influenza,inj,Quad PF,36+ Mos 10/17/2012, 12/19/2014  . Pneumococcal Conjugate-13 06/19/2014  . Td 05/19/2006    Screening Tests Health Maintenance  Topic Date Due  . PNEUMOCOCCAL POLYSACCHARIDE VACCINE (1) 12/30/1961  . MAMMOGRAM  12/30/2009  . HEMOGLOBIN A1C  06/11/2015  . INFLUENZA VACCINE  08/20/2015  . URINE MICROALBUMIN  01/01/2016 (Originally 12/30/1969)  . OPHTHALMOLOGY EXAM  08/07/2016 (Originally 12/30/1969)  . Hepatitis C Screening  02/02/2018  (Originally Mar 06, 1960)  . HIV Screening  02/02/2018 (Originally 12/31/1974)  . PAP SMEAR  11/15/2020 (Originally 12/30/1980)  . FOOT EXAM  12/19/2015  . TETANUS/TDAP  05/18/2016  . COLONOSCOPY  06/11/2021    All answers were reviewed with the patient and necessary referrals were made:  Vanetta Mulders, LPN   [id number]   History reviewed: allergies, current medications, past family history, past medical history, past social history, past surgical history and problem list  Review of Systems A comprehensive review of systems was negative.    Objective:     Vision by Snellen chart: right [id number] to measure, left [id number] to measure  There is no height or weight on file to calculate BMI. There were no vitals taken for this visit.  No exam performed today, annual wellness visit without physical exam.     Assessment:     Medicare annual wellness visit, subsequent Annual exam as documented.  Immunization  needs are specifically addressed at this visit.       Plan:     During the course of the visit the patient was educated and counseled about appropriate screening and preventive services including:    Advanced directives: has NO advanced directive  - add't info requested. Referral to SW: not applicable  Diet review for nutrition referral? Yes ____  Not Indicated __x__   Patient Instructions (the written plan) was given to the patient.  Medicare Attestation I have personally reviewed: The patient's medical and social history Their use of alcohol, tobacco or illicit drugs Their current medications and supplements The patient's functional ability including ADLs,fall risks, home safety risks, cognitive, and hearing and visual impairment Diet and physical activities Evidence for depression or mood disorders  The patient's weight, height, BMI, and visual acuity have been recorded in the chart.  I have made referrals, counseling, and provided education to  the patient based on review of the above and I have provided the patient with a written personalized care plan for preventive services.     Denman George [place], [place]   [id number]

## 2015-10-09 NOTE — Patient Instructions (Signed)
Thank you for choosing [facility] for your health care needs  The Annual Wellness Visit is designed to allow April Watson the chance to assist you in preserving and improving you health.   Dr. Moshe Cipro will see you back in 4 months   I will mail you labs to have done in before your next visit.

## 2015-10-10 ENCOUNTER — Telehealth: Payer: Self-pay

## 2015-10-10 ENCOUNTER — Other Ambulatory Visit: Payer: Self-pay | Admitting: Family Medicine

## 2015-10-10 MED ORDER — MONTELUKAST SODIUM 10 MG PO TABS: 10.0000 mg | ORAL_TABLET | Freq: Every day | ORAL | 3 refills | Status: DC

## 2015-10-10 MED ORDER — PREDNISONE 5 MG PO TABS: 5.0000 mg | ORAL_TABLET | Freq: Two times a day (BID) | ORAL | 0 refills | Status: AC

## 2015-10-10 NOTE — Telephone Encounter (Signed)
pls let thenm know 5 day course of prednisone sent and singulair

## 2015-10-10 NOTE — Telephone Encounter (Signed)
Family aware  

## 2015-10-11 ENCOUNTER — Telehealth: Payer: Self-pay | Admitting: Family Medicine

## 2015-10-11 DIAGNOSIS — R7301 Impaired fasting glucose: Secondary | ICD-10-CM

## 2015-10-11 DIAGNOSIS — E559 Vitamin D deficiency, unspecified: Secondary | ICD-10-CM

## 2015-10-11 DIAGNOSIS — E785 Hyperlipidemia, unspecified: Secondary | ICD-10-CM

## 2015-10-11 DIAGNOSIS — I10 Essential (primary) hypertension: Secondary | ICD-10-CM

## 2015-10-11 NOTE — Telephone Encounter (Signed)
Needs all labs fasting 11/24 or shortly after pls explain to family and mail order for CBC, fasting lipid, cmp , hBa1C, tSH and vit D

## 2015-10-11 NOTE — Assessment & Plan Note (Signed)
Annual exam as documented.  Immunization needs are specifically addressed at this visit.  

## 2015-10-11 NOTE — Telephone Encounter (Signed)
Mailed lab orders to sister, [facility]

## 2015-10-11 NOTE — Addendum Note (Signed)
Addended by: Eual Fines on: 10/11/2015 09:33 AM   Modules accepted: Orders

## 2015-11-21 ENCOUNTER — Other Ambulatory Visit: Payer: Self-pay | Admitting: *Deleted

## 2015-11-21 NOTE — Patient Outreach (Signed)
[facility]) Care Management  11/21/2015  April Watson April 14, 1960 [id number]  Unable to reach Ms. Quast's family by phone today.   Plan: I will reach out to them again next week to follow up on Ms. Teal's needs.    [place] Management  [phone]

## 2015-11-28 ENCOUNTER — Other Ambulatory Visit: Payer: Self-pay | Admitting: *Deleted

## 2015-11-28 NOTE — Patient Outreach (Signed)
[facility]) Care Management  11/28/2015  April Watson [birth year] [id number]  April Watson was referred to [place] Management by her primary care provider, Dr. Tula Nakayama, for assistancewith care coordination needs andoversight of skin care concerns. April Watson has history of mental retardation, congenital hydrocephalus, hyperlipidemia, diabetes mellitus type II, and skin breakdown of the sacrum.   I have been following April Watson and collaborating with her sister, April Watson who is primary caregiver and HCPOA. April Watson also has in home caregivers (CAP) who are very knowledgeable and involved in April Watson care. We have worked on DME needs and have been able to get a gel cushion for [employer] wheelchair. April Watson has completed needed home health nursing visits and no longer has need of dressing changes.   I spoke with April Watson, primary caregiver and HCPOA for April Watson who says that at this time she feels confident that all of April Watson needs are being met. She does not feel further care management intervention is needed at this time.   Plan: I will close April Watson case but we are available to assist with April Watson care at any time in the future should be able to provide assistance. April Watson has my contact information.    [place] Management  [phone]

## 2015-11-29 ENCOUNTER — Other Ambulatory Visit: Payer: Self-pay | Admitting: Family Medicine

## 2015-12-30 ENCOUNTER — Other Ambulatory Visit: Payer: Self-pay | Admitting: Family Medicine

## 2016-01-16 ENCOUNTER — Other Ambulatory Visit: Payer: Self-pay | Admitting: Family Medicine

## 2016-01-16 ENCOUNTER — Telehealth: Payer: Self-pay | Admitting: Family Medicine

## 2016-01-16 MED ORDER — PROMETHAZINE-DM 6.25-15 MG/5ML PO SYRP: ORAL_SOLUTION | ORAL | 0 refills | Status: DC

## 2016-01-16 NOTE — Telephone Encounter (Signed)
Family aware to check with pharmacy

## 2016-01-16 NOTE — Telephone Encounter (Signed)
Dry cough x 2 days.  More at night.  Please advise.

## 2016-01-16 NOTE — Telephone Encounter (Signed)
April Watson is asking if we can call in something for April Watson for a cough , please advise?

## 2016-01-16 NOTE — Telephone Encounter (Signed)
Med sent to [facility]A

## 2016-01-29 ENCOUNTER — Other Ambulatory Visit: Payer: Self-pay | Admitting: Family Medicine

## 2016-02-01 DIAGNOSIS — E785 Hyperlipidemia, unspecified: Secondary | ICD-10-CM | POA: Diagnosis not present

## 2016-02-01 DIAGNOSIS — E559 Vitamin D deficiency, unspecified: Secondary | ICD-10-CM | POA: Diagnosis not present

## 2016-02-01 DIAGNOSIS — I10 Essential (primary) hypertension: Secondary | ICD-10-CM | POA: Diagnosis not present

## 2016-02-01 DIAGNOSIS — R7301 Impaired fasting glucose: Secondary | ICD-10-CM | POA: Diagnosis not present

## 2016-02-01 LAB — COMPLETE METABOLIC PANEL WITH GFR
ALT: 13 U/L (ref 6–29)
AST: 13 U/L (ref 10–35)
Albumin: 3.9 g/dL (ref 3.6–5.1)
Alkaline Phosphatase: 58 U/L (ref 33–130)
BUN: 10 mg/dL (ref 7–25)
CO2: 35 mmol/L — AB (ref 20–31)
Calcium: 9.6 mg/dL (ref 8.6–10.4)
Chloride: 92 mmol/L — ABNORMAL LOW (ref 98–110)
Creat: 0.8 mg/dL (ref 0.50–1.05)
GFR, EST NON AFRICAN AMERICAN: 83 mL/min (ref >=60)
GFR, Est African American: >89 mL/min (ref >=60)
GLUCOSE: 117 mg/dL — AB (ref 65–99)
POTASSIUM: 3.1 mmol/L — AB (ref 3.5–5.3)
SODIUM: 140 mmol/L (ref 135–146)
TOTAL PROTEIN: 7.6 g/dL (ref 6.1–8.1)
Total Bilirubin: 0.4 mg/dL (ref 0.2–1.2)

## 2016-02-01 LAB — CBC
HEMATOCRIT: 41.3 % (ref 35.0–45.0)
Hemoglobin: 13.8 g/dL (ref 11.7–15.5)
MCH: 29.6 pg (ref 27.0–33.0)
MCHC: 33.4 g/dL (ref 32.0–36.0)
MCV: 88.4 fL (ref 80.0–100.0)
MPV: 9.9 fL (ref 7.5–12.5)
Platelets: 224 10*3/uL (ref 140–400)
RBC: 4.67 MIL/uL (ref 3.80–5.10)
RDW: 14 % (ref 11.0–15.0)
WBC: 10.8 10*3/uL (ref 3.8–10.8)

## 2016-02-01 LAB — LIPID PANEL
Cholesterol: 173 mg/dL (ref <200)
HDL: 49 mg/dL — AB (ref >50)
LDL CALC: 103 mg/dL — AB (ref <100)
Total CHOL/HDL Ratio: 3.5 Ratio (ref <5.0)
Triglycerides: 107 mg/dL (ref <150)
VLDL: 21 mg/dL (ref <30)

## 2016-02-01 LAB — TSH: TSH: 2.99 m[IU]/L

## 2016-02-03 ENCOUNTER — Other Ambulatory Visit: Payer: Self-pay | Admitting: Family Medicine

## 2016-02-03 LAB — HEMOGLOBIN A1C
Hgb A1c MFr Bld: 6.7 % — ABNORMAL HIGH (ref <5.7)
Mean Plasma Glucose: 146 mg/dL

## 2016-02-03 LAB — VITAMIN D 25 HYDROXY (VIT D DEFICIENCY, FRACTURES): VIT D 25 HYDROXY: 87 ng/mL (ref 30–100)

## 2016-02-07 ENCOUNTER — Telehealth: Payer: Self-pay

## 2016-02-07 ENCOUNTER — Other Ambulatory Visit: Payer: Self-pay

## 2016-02-07 DIAGNOSIS — I10 Essential (primary) hypertension: Secondary | ICD-10-CM

## 2016-02-07 DIAGNOSIS — E559 Vitamin D deficiency, unspecified: Secondary | ICD-10-CM

## 2016-02-07 MED ORDER — UNABLE TO FIND: 99 refills | Status: DC

## 2016-02-07 MED ORDER — UNDERPADS MISC: 99 refills | Status: DC

## 2016-02-07 NOTE — Telephone Encounter (Signed)
-----   Message from Fayrene Helper, MD sent at 02/03/2016 11:35 AM EST ----- Needs to increase potassium to 10 meq twice daily, otherwise no changes, keep sched appt, will be looking out for her. New dose is entered pls send and notify

## 2016-02-07 NOTE — Telephone Encounter (Signed)
Opened in error

## 2016-02-10 ENCOUNTER — Ambulatory Visit: Payer: Self-pay | Admitting: Family Medicine

## 2016-02-10 ENCOUNTER — Other Ambulatory Visit: Payer: Self-pay

## 2016-02-10 MED ORDER — POTASSIUM CHLORIDE CRYS ER 10 MEQ PO TBCR: EXTENDED_RELEASE_TABLET | ORAL | 3 refills | Status: DC

## 2016-02-11 ENCOUNTER — Other Ambulatory Visit: Payer: Self-pay | Admitting: Family Medicine

## 2016-02-19 ENCOUNTER — Encounter: Payer: Self-pay | Admitting: Family Medicine

## 2016-02-19 ENCOUNTER — Ambulatory Visit (INDEPENDENT_AMBULATORY_CARE_PROVIDER_SITE_OTHER): Payer: Medicare Other | Admitting: Family Medicine

## 2016-02-19 VITALS — BP 106/68 | HR 70 | Resp 18

## 2016-02-19 DIAGNOSIS — I10 Essential (primary) hypertension: Secondary | ICD-10-CM | POA: Diagnosis not present

## 2016-02-19 DIAGNOSIS — E559 Vitamin D deficiency, unspecified: Secondary | ICD-10-CM

## 2016-02-19 DIAGNOSIS — R569 Unspecified convulsions: Secondary | ICD-10-CM | POA: Diagnosis not present

## 2016-02-19 DIAGNOSIS — K219 Gastro-esophageal reflux disease without esophagitis: Secondary | ICD-10-CM

## 2016-02-19 DIAGNOSIS — K5909 Other constipation: Secondary | ICD-10-CM | POA: Diagnosis not present

## 2016-02-19 MED ORDER — ERGOCALCIFEROL 1.25 MG (50000 UT) PO CAPS: ORAL_CAPSULE | ORAL | 1 refills | Status: DC

## 2016-02-19 NOTE — Patient Instructions (Addendum)
Annual wellness Sept 24 or after, call if you need me sooner  Reduce vit D capsule to two times o per month, every 2 weeks  Keep well and keep family in order

## 2016-02-23 NOTE — Assessment & Plan Note (Signed)
On excellent bowel regime, has on avg 3 BM's per day

## 2016-02-23 NOTE — Assessment & Plan Note (Signed)
reuce frequency of supplement to twice per month

## 2016-02-23 NOTE — Assessment & Plan Note (Signed)
April Watson diet controlled and doing well  Diabetic Labs Latest Ref Rng & Units 02/01/2016 12/12/2014 06/16/2014 11/25/2013 05/11/2013  HbA1c <5.7 % 6.7(H) 6.2(H) 6.1(H) 5.9(H) 6.1(H)  Chol <200 mg/dL 173 170 159 149 -  HDL >50 mg/dL 49(L) 48 52 41 -  Calc LDL <100 mg/dL 103(H) 103 91 89 -  Triglycerides <150 mg/dL 107 96 80 97 -  Creatinine 0.50 - 1.05 mg/dL 0.80 0.64 0.62 0.68 0.62   BP/Weight 02/19/2016 10/09/2015 08/22/2015 06/05/2015 12/19/2014 06/19/2014 [id number]  Systolic BP A999333 A999333 0000000 A999333 [id number]  Diastolic BP 68 70 84 78 80 76 72  Wt. (Lbs) - - - - - - -   No flowsheet data found.

## 2016-02-23 NOTE — Progress Notes (Signed)
   April Watson     MRN: [id number]      DOB: 08-11-1960   HPI April Watson is here for follow up and re-evaluation of chronic medical conditions, medication management and review of any available recent lab and radiology data.  Preventive health is updated, specifically   Immunization.   There are no new concerns. Sibling requests checking 4th toe on right foot where the area was dark sore and peeling in past, no real current concern, wants to be sure OK and if any regular barrier needs to be applied to the area ROS See HPI History from her sisters as she is incapable Denies recent fever or chills. Denies  nasal congestion,  or sore throat. Denies chest congestion, productive cough or wheezing. Denies leg swelling Denies abdominal pain, vomiting,diarrhea or constipation.   Denies foul urine. Chronic limitation in mobility. Denies skin break down or rash.   PE  BP 106/68   Pulse 70   Resp 18   SpO2 91%   Patient alert able to follow with eyes, hydrocephalic  HEENT:  EOMI,   oropharynx pink and moist.  Neck supple no JVD, no mass.  Chest: Clear to auscultation bilaterally.  CVS: S1, S2 no murmurs, no S3.Regular rate.  ABD: Soft non tender.   Ext: No edema  MS: decreased  ROM spine, shoulders, hips and knees.  Skin: Intact, no ulcerations or rash noted.  Psych: Good eye contact, .  CNS: CN 2-12 intact, quadripareretic  Assessment & Plan  Essential hypertension Controlled, no change in medication   Diabetes mellitus type 2 in obese [facility]) April Watson diet controlled and doing well  Diabetic Labs Latest Ref Rng & Units 02/01/2016 12/12/2014 06/16/2014 11/25/2013 05/11/2013  HbA1c <5.7 % 6.7(H) 6.2(H) 6.1(H) 5.9(H) 6.1(H)  Chol <200 mg/dL 173 170 159 149 -  HDL >50 mg/dL 49(L) 48 52 41 -  Calc LDL <100 mg/dL 103(H) 103 91 89 -  Triglycerides <150 mg/dL 107 96 80 97 -  Creatinine 0.50 - 1.05 mg/dL 0.80 0.64 0.62 0.68 0.62   BP/Weight 02/19/2016 10/09/2015 08/22/2015  06/05/2015 12/19/2014 06/19/2014 [id number]  Systolic BP A999333 A999333 0000000 A999333 [id number]  Diastolic BP 68 70 84 78 80 76 72  Wt. (Lbs) - - - - - - -   No flowsheet data found.      Convulsions Seizure free on prophylactic medication  CONSTIPATION, CHRONIC On excellent bowel regime, has on avg 3 BM's per day  GERD Controlled, no change in medication   Vitamin D deficiency reuce frequency of supplement to twice per month

## 2016-02-23 NOTE — Assessment & Plan Note (Signed)
Controlled, no change in medication  

## 2016-02-23 NOTE — Assessment & Plan Note (Signed)
Seizure free on prophylactic medication

## 2016-02-29 ENCOUNTER — Other Ambulatory Visit: Payer: Self-pay | Admitting: Family Medicine

## 2016-04-13 ENCOUNTER — Other Ambulatory Visit: Payer: Self-pay | Admitting: Family Medicine

## 2016-06-12 ENCOUNTER — Other Ambulatory Visit: Payer: Self-pay | Admitting: Family Medicine

## 2016-07-27 ENCOUNTER — Other Ambulatory Visit: Payer: Self-pay | Admitting: Family Medicine

## 2016-08-12 ENCOUNTER — Other Ambulatory Visit: Payer: Self-pay | Admitting: Family Medicine

## 2016-08-27 ENCOUNTER — Other Ambulatory Visit: Payer: Self-pay | Admitting: Family Medicine

## 2016-09-01 ENCOUNTER — Telehealth: Payer: Self-pay | Admitting: Family Medicine

## 2016-09-01 ENCOUNTER — Other Ambulatory Visit: Payer: Self-pay | Admitting: Family Medicine

## 2016-09-01 NOTE — Telephone Encounter (Signed)
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  thanks

## 2016-09-02 ENCOUNTER — Telehealth: Payer: Self-pay

## 2016-09-02 MED ORDER — UNABLE TO FIND: 0 refills | Status: DC

## 2016-09-02 NOTE — Telephone Encounter (Signed)
Yes pls sched

## 2016-09-02 NOTE — Telephone Encounter (Signed)
[facility] faxed back stating that she doesn't qualify for the hoyer lift unless she has a face to face visit within the past 90 days and documentation in the chart that a hoyer lift is needed. Is her family able to transport her? if so I will call and schedule an appt to come in for this.

## 2016-09-02 NOTE — Telephone Encounter (Signed)
Patient is scheduled for 09/08/16 at 1:40.

## 2016-09-02 NOTE — Telephone Encounter (Signed)
Lift faxed and handicapped card mailed

## 2016-09-02 NOTE — Telephone Encounter (Signed)
April Watson needs an appt before she can get the hoyer lift. Please schedule sometime next week

## 2016-09-08 ENCOUNTER — Ambulatory Visit (INDEPENDENT_AMBULATORY_CARE_PROVIDER_SITE_OTHER): Payer: Medicare Other | Admitting: Family Medicine

## 2016-09-08 ENCOUNTER — Encounter: Payer: Self-pay | Admitting: Family Medicine

## 2016-09-08 VITALS — BP 128/70 | HR 64 | Resp 15

## 2016-09-08 DIAGNOSIS — E669 Obesity, unspecified: Secondary | ICD-10-CM | POA: Diagnosis not present

## 2016-09-08 DIAGNOSIS — E785 Hyperlipidemia, unspecified: Secondary | ICD-10-CM

## 2016-09-08 DIAGNOSIS — Q039 Congenital hydrocephalus, unspecified: Secondary | ICD-10-CM

## 2016-09-08 DIAGNOSIS — E1169 Type 2 diabetes mellitus with other specified complication: Secondary | ICD-10-CM

## 2016-09-08 DIAGNOSIS — Z23 Encounter for immunization: Secondary | ICD-10-CM | POA: Diagnosis not present

## 2016-09-08 DIAGNOSIS — I10 Essential (primary) hypertension: Secondary | ICD-10-CM | POA: Diagnosis not present

## 2016-09-08 DIAGNOSIS — G8250 Quadriplegia, unspecified: Secondary | ICD-10-CM

## 2016-09-08 NOTE — Patient Instructions (Addendum)
Keep wellness with nurse please  Flu vaccine today  MD follow up in  March , call if you need me sooner    In next 1 to 2 weeks please fasting lipid,, cmp and eGFR and HBA1c  I will work on Museum/gallery conservator and be in touch with your family  Thank you  for choosing [facility]. We consider it a privelige to serve you.  Delivering excellent health care in a caring and  compassionate way is our goal.  Partnering with you,  so that together we can achieve this goal is our strategy.   LOVE YOU!

## 2016-09-08 NOTE — Progress Notes (Addendum)
April Watson     MRN: [id number]      DOB: 18-Feb-1960   HPI April Watson is here for face to face evaluation for need for [employer] lift. Her weight is approximately 200 pounds. Although I have been advised that the requirement for an electric hoyer lift is for a patient who weight 600 pounds or  more, I am still presenting April Watson as a special case for consideration for an electric hoyer lift. Because of quadreparesis, April Watson is unable to ambulate and needs to be transported by her family. She is also in need of a wheelchair for transport issues   April Watson is an extremely special 56 y/o patient , who has congenital hydrocephalus, and who the family states that they were told when she an infant, that she  would not live to be an adult. April Watson has been "The [place]" of the family, and continues to be. She has,throughout her life, been lovingly cared for by her parents, now deceased, and her 3 siblings, 2 sisters and one brother. Unfortunately earlier this year, she lost one of her 2 sisters who had battled cancer courageously for over  10 years , after a very rapid decline  Approximately 6 months ago. April Watson is now being cared for primarily by one sister who will be  89 years old next month, and  and who,  has significant health  challenges which include bilateral rotator cuff disease and upper extremity weakness.. She also suffers from  chronic back pain. She does have in home assistance for 40 hours per week, Monday through Friday with April Watson. April Watson is incapable of any independent movement and requires lifting. Her only brother is a  Theme park manager in the [employer],  And he lives and  works in [place], he assists as able, for 2 days per week.  The family never has, and never will consider placing April Watson in a facility. In the words of her sister in law, she has ALWAYS been the [place] of the family and will always be, and it "would kill' the siblings to place her in a home  .  Undoubtedly, the love and care which April Watson has received from her family is what has not only sustained her, but has allowed her to flourish as a person who KNOWS, that she is truly loved and cherished by her family, and who in turn shares love with those of [place] who are blessed to be a part of her life. April Watson was a baby who was "not supposed to have lived to adulthood" who is now 56 years old and who has not required hospitalization  For the past 6 years or more , and whose family strongly desires and want to conitnue to o care for her at  Home for her entire lifetime  They have carried this out very effectively to this point and the need to continue to do so is even stronger at this time for her 2 remaining siblings.  Special consideration should be given to this situation not only from a "human standpoint" but  Also in terms of an economic one. The cost of the lift will more than cover the cost of placement in a skilled nursing facility many times over, which April Watson could easily qualify for ,  On medical grounds, but is absolutely not what the family desires        ROS History from family Denies recent fever or chills. Denies sinus pressure, nasal congestion, ear pain or  sore throat. Denies chest congestion, productive cough or wheezing. Denies leg swelling Denies abdominal pain, nausea, vomiting,diarrhea or constipation.   Denies foul smelling urine, has chronic incontinece Chronic  limitation in mobility.  Denies skin break down or rash.   PE  BP 128/70   Pulse 64   Resp 15   SpO2 95%   Patient alert and in no cardiopulmonary distress.  HEENT: hydrocephalus, EOMI,   oropharynx  moist.  Neck supple no  no mass.  Chest: Clear to auscultation bilaterally.  CVS: S1, S2 no murmurs, no S3.Regular rate.  ABD: Soft, obese, non tender.   Ext: No edema  April: Decreased  ROM spine, shoulders, hips and knees.  Skin: Intact, no ulcerations or rash noted.  .  CNS:  quadriplegic.   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 Incapable of raising up out of a bed or a chair , requires lifting for any change in position. Caregiver's health and  age preclude the ability to use a manual hoyer lift . The emotional support and physical care provided by this family for the past 71 years speak for itself, and the remaining family members, one sister and one brother ,strongly desire to maintain April Watson at home,continuing to do what they have always done in a way that exceeded medical expectations  Quadriplegia and quadriparesis ([place]) Complication of congenital hydrocephalus Pt is incapable of independent movement and requires an electric hoyer lift fro reasons stated  Patient is incapable of independent mobility, hence nea wheelchair to be transported m

## 2016-09-11 ENCOUNTER — Encounter: Payer: Self-pay | Admitting: Family Medicine

## 2016-09-11 ENCOUNTER — Other Ambulatory Visit: Payer: Self-pay | Admitting: Family Medicine

## 2016-09-11 DIAGNOSIS — E785 Hyperlipidemia, unspecified: Secondary | ICD-10-CM | POA: Diagnosis not present

## 2016-09-11 DIAGNOSIS — E669 Obesity, unspecified: Secondary | ICD-10-CM | POA: Diagnosis not present

## 2016-09-11 DIAGNOSIS — G8250 Quadriplegia, unspecified: Secondary | ICD-10-CM | POA: Insufficient documentation

## 2016-09-11 DIAGNOSIS — E1169 Type 2 diabetes mellitus with other specified complication: Secondary | ICD-10-CM | POA: Diagnosis not present

## 2016-09-11 DIAGNOSIS — I10 Essential (primary) hypertension: Secondary | ICD-10-CM | POA: Diagnosis not present

## 2016-09-11 NOTE — Assessment & Plan Note (Signed)
Congenital hydrocephalus with quadriplegia and requires total care for all activities of daily living  Incapable of any independent activity. Incapable of raising up out of a bed or a chair , requires lifting for any change in position. Caregiver's health and  age preclude the ability to use a manual hoyer lift . The emotional support and physical care provided by this family for the past 45 years speak for itself, and the remaining family members, one sister and one brother ,strongly desire to maintain April Watson at home,continuing to do what they have always done in a way that exceeded medical expectations

## 2016-09-11 NOTE — Assessment & Plan Note (Signed)
Complication of congenital hydrocephalus Pt is incapable of independent movement and requires an electric hoyer lift fro reasons stated

## 2016-09-12 LAB — COMPLETE METABOLIC PANEL WITH GFR
ALBUMIN: 4.2 g/dL (ref 3.6–5.1)
ALT: 11 U/L (ref 6–29)
AST: 14 U/L (ref 10–35)
Alkaline Phosphatase: 71 U/L (ref 33–130)
BUN: 11 mg/dL (ref 7–25)
CHLORIDE: 99 mmol/L (ref 98–110)
CO2: 29 mmol/L (ref 20–32)
CREATININE: 0.69 mg/dL (ref 0.50–1.05)
Calcium: 9.5 mg/dL (ref 8.6–10.4)
GFR, Est African American: >89 mL/min (ref >=60)
GLUCOSE: 101 mg/dL — AB (ref 65–99)
Potassium: 4 mmol/L (ref 3.5–5.3)
Sodium: 141 mmol/L (ref 135–146)
TOTAL PROTEIN: 7 g/dL (ref 6.1–8.1)
Total Bilirubin: 0.3 mg/dL (ref 0.2–1.2)

## 2016-09-12 LAB — LIPID PANEL
CHOLESTEROL: 164 mg/dL (ref <200)
HDL: 47 mg/dL — AB (ref >50)
LDL Cholesterol: 98 mg/dL (ref <100)
Total CHOL/HDL Ratio: 3.5 Ratio (ref <5.0)
Triglycerides: 94 mg/dL (ref <150)
VLDL: 19 mg/dL (ref <30)

## 2016-09-12 LAB — HEMOGLOBIN A1C
Hgb A1c MFr Bld: 6.4 % — ABNORMAL HIGH (ref <5.7)
MEAN PLASMA GLUCOSE: 137 mg/dL

## 2016-09-28 ENCOUNTER — Other Ambulatory Visit: Payer: Self-pay | Admitting: Family Medicine

## 2016-09-29 ENCOUNTER — Other Ambulatory Visit: Payer: Self-pay

## 2016-09-29 MED ORDER — PHENOBARBITAL 32.4 MG PO TABS: ORAL_TABLET | ORAL | 1 refills | Status: DC

## 2016-10-01 ENCOUNTER — Other Ambulatory Visit: Payer: Self-pay

## 2016-10-01 ENCOUNTER — Other Ambulatory Visit: Payer: Self-pay | Admitting: Family Medicine

## 2016-10-01 MED ORDER — PHENOBARBITAL 32.4 MG PO TABS: 32.4000 mg | ORAL_TABLET | Freq: Two times a day (BID) | ORAL | 3 refills | Status: DC

## 2016-10-01 MED ORDER — PHENOBARBITAL 32.4 MG PO TABS: ORAL_TABLET | ORAL | 1 refills | Status: DC

## 2016-10-06 ENCOUNTER — Other Ambulatory Visit: Payer: Self-pay

## 2016-10-06 ENCOUNTER — Encounter: Payer: Self-pay | Admitting: Family Medicine

## 2016-10-06 MED ORDER — UNABLE TO FIND: 0 refills | Status: DC

## 2016-10-09 ENCOUNTER — Encounter: Payer: Self-pay | Admitting: Family Medicine

## 2016-10-09 MED ORDER — UNABLE TO FIND: 0 refills | Status: DC

## 2016-10-12 ENCOUNTER — Other Ambulatory Visit: Payer: Self-pay | Admitting: Family Medicine

## 2016-10-12 NOTE — Telephone Encounter (Signed)
Seen [phone] 

## 2016-10-13 ENCOUNTER — Other Ambulatory Visit: Payer: Self-pay

## 2016-10-13 DIAGNOSIS — G8250 Quadriplegia, unspecified: Secondary | ICD-10-CM

## 2016-10-13 NOTE — Progress Notes (Signed)
Order for [facility] and faxed to [employer].

## 2016-10-14 ENCOUNTER — Ambulatory Visit (INDEPENDENT_AMBULATORY_CARE_PROVIDER_SITE_OTHER): Payer: Medicare Other

## 2016-10-14 ENCOUNTER — Telehealth: Payer: Self-pay | Admitting: Family Medicine

## 2016-10-14 VITALS — BP 126/72 | HR 78 | Temp 97.9°F

## 2016-10-14 DIAGNOSIS — Z23 Encounter for immunization: Secondary | ICD-10-CM | POA: Diagnosis not present

## 2016-10-14 DIAGNOSIS — Z0000 Encounter for general adult medical examination without abnormal findings: Secondary | ICD-10-CM

## 2016-10-14 MED ORDER — UNABLE TO FIND: 0 refills | Status: DC

## 2016-10-14 NOTE — Telephone Encounter (Signed)
HOYER LIFT (NOT ELECTRIC) to [place] to [facility]     ([place]) SEND SEPARATE Rx    FAX  [place]  [phone]

## 2016-10-14 NOTE — Patient Instructions (Addendum)
April Watson , Thank you for taking time to come for your Medicare Wellness Visit. I appreciate your ongoing commitment to your health goals. Please review the following plan we discussed and let me know if I can assist you in the future.   Screening recommendations/referrals: Colonoscopy: Next due 05/2021, declined today Mammogram: Due, declined today Bone Density: Due at age 56 Diabetic Eye Exam: Due, declines Recommended yearly dental visit for hygiene and checkup  Vaccinations: Influenza vaccine: Up to date Pneumococcal vaccine: Administered today Tdap vaccine: Due, declines  Shingles vaccine: Due, declines    Advanced directives: Advance directive discussed with you today. I have provided a copy for you to complete at home and have notarized. Once this is complete please bring a copy in to our office so we can scan it into your chart.  Next appointment: Follow up on 04/08/2017 at 2:20 pm. Follow up in 1 year for your annual wellness visit.   Preventive Care 40-64 Years, Female Preventive care refers to lifestyle choices and visits with your health care provider that can promote health and wellness. What does preventive care include?  A yearly physical exam. This is also called an annual well check.  Dental exams once or twice a year.  Routine eye exams. Ask your health care provider how often you should have your eyes checked.  Personal lifestyle choices, including:  Daily care of your teeth and gums.  Regular physical activity.  Eating a healthy diet.  Avoiding tobacco and drug use.  Limiting alcohol use.  Practicing safe sex.  Taking low-dose aspirin daily starting at age 90.  Taking vitamin and mineral supplements as recommended by your health care provider. What happens during an annual well check? The services and screenings done by your health care provider during your annual well check will depend on your age, overall health, lifestyle risk factors, and family  history of disease. Counseling  Your health care provider may ask you questions about your:  Alcohol use.  Tobacco use.  Drug use.  Emotional well-being.  Home and relationship well-being.  Sexual activity.  Eating habits.  Work and work Statistician.  Method of birth control.  Menstrual cycle.  Pregnancy history. Screening  You may have the following tests or measurements:  Height, weight, and BMI.  Blood pressure.  Lipid and cholesterol levels. These may be checked every 5 years, or more frequently if you are over 27 years old.  Skin check.  Lung cancer screening. You may have this screening every year starting at age 36 if you have a 30-pack-year history of smoking and currently smoke or have quit within the past 15 years.  Fecal occult blood test (FOBT) of the stool. You may have this test every year starting at age 58.  Flexible sigmoidoscopy or colonoscopy. You may have a sigmoidoscopy every 5 years or a colonoscopy every 10 years starting at age 41.  Hepatitis C blood test.  Hepatitis B blood test.  Sexually transmitted disease (STD) testing.  Diabetes screening. This is done by checking your blood sugar (glucose) after you have not eaten for a while (fasting). You may have this done every 1-3 years.  Mammogram. This may be done every 1-2 years. Talk to your health care provider about when you should start having regular mammograms. This may depend on whether you have a family history of breast cancer.  BRCA-related cancer screening. This may be done if you have a family history of breast, ovarian, tubal, or peritoneal cancers.  Pelvic exam and Pap test. This may be done every 3 years starting at age 29. Starting at age 15, this may be done every 5 years if you have a Pap test in combination with an HPV test.  Bone density scan. This is done to screen for osteoporosis. You may have this scan if you are at high risk for osteoporosis. Discuss your test  results, treatment options, and if necessary, the need for more tests with your health care provider. Vaccines  Your health care provider may recommend certain vaccines, such as:  Influenza vaccine. This is recommended every year.  Tetanus, diphtheria, and acellular pertussis (Tdap, Td) vaccine. You may need a Td booster every 10 years.  Zoster vaccine. You may need this after age 9.  Pneumococcal 13-valent conjugate (PCV13) vaccine. You may need this if you have certain conditions and were not previously vaccinated.  Pneumococcal polysaccharide (PPSV23) vaccine. You may need one or two doses if you smoke cigarettes or if you have certain conditions. Talk to your health care provider about which screenings and vaccines you need and how often you need them. This information is not intended to replace advice given to you by your health care provider. Make sure you discuss any questions you have with your health care provider. Document Released: 02/01/2015 Document Revised: 09/25/2015 Document Reviewed: 11/06/2014 Elsevier Interactive Patient Education  2017 [place] Prevention in the Home Falls can cause injuries. They can happen to people of all ages. There are many things you can do to make your home safe and to help prevent falls. What can I do on the outside of my home?  Regularly fix the edges of walkways and driveways and fix any cracks.  Remove anything that might make you trip as you walk through a door, such as a raised step or threshold.  Trim any bushes or trees on the path to your home.  Use bright outdoor lighting.  Clear any walking paths of anything that might make someone trip, such as rocks or tools.  Regularly check to see if handrails are loose or broken. Make sure that both sides of any steps have handrails.  Any raised decks and porches should have guardrails on the edges.  Have any leaves, snow, or ice cleared regularly.  Use sand or salt on  walking paths during winter.  Clean up any spills in your garage right away. This includes oil or grease spills. What can I do in the bathroom?  Use night lights.  Install grab bars by the toilet and in the tub and shower. Do not use towel bars as grab bars.  Use non-skid mats or decals in the tub or shower.  If you need to sit down in the shower, use a plastic, non-slip stool.  Keep the floor dry. Clean up any water that spills on the floor as soon as it happens.  Remove soap buildup in the tub or shower regularly.  Attach bath mats securely with double-sided non-slip rug tape.  Do not have throw rugs and other things on the floor that can make you trip. What can I do in the bedroom?  Use night lights.  Make sure that you have a light by your bed that is easy to reach.  Do not use any sheets or blankets that are too big for your bed. They should not hang down onto the floor.  Have a firm chair that has side arms. You can use this for  support while you get dressed.  Do not have throw rugs and other things on the floor that can make you trip. What can I do in the kitchen?  Clean up any spills right away.  Avoid walking on wet floors.  Keep items that you use a lot in easy-to-reach places.  If you need to reach something above you, use a strong step stool that has a grab bar.  Keep electrical cords out of the way.  Do not use floor polish or wax that makes floors slippery. If you must use wax, use non-skid floor wax.  Do not have throw rugs and other things on the floor that can make you trip. What can I do with my stairs?  Do not leave any items on the stairs.  Make sure that there are handrails on both sides of the stairs and use them. Fix handrails that are broken or loose. Make sure that handrails are as long as the stairways.  Check any carpeting to make sure that it is firmly attached to the stairs. Fix any carpet that is loose or worn.  Avoid having throw  rugs at the top or bottom of the stairs. If you do have throw rugs, attach them to the floor with carpet tape.  Make sure that you have a light switch at the top of the stairs and the bottom of the stairs. If you do not have them, ask someone to add them for you. What else can I do to help prevent falls?  Wear shoes that:  Do not have high heels.  Have rubber bottoms.  Are comfortable and fit you well.  Are closed at the toe. Do not wear sandals.  If you use a stepladder:  Make sure that it is fully opened. Do not climb a closed stepladder.  Make sure that both sides of the stepladder are locked into place.  Ask someone to hold it for you, if possible.  Clearly mark and make sure that you can see:  Any grab bars or handrails.  First and last steps.  Where the edge of each step is.  Use tools that help you move around (mobility aids) if they are needed. These include:  Canes.  Walkers.  Scooters.  Crutches.  Turn on the lights when you go into a dark area. Replace any light bulbs as soon as they burn out.  Set up your furniture so you have a clear path. Avoid moving your furniture around.  If any of your floors are uneven, fix them.  If there are any pets around you, be aware of where they are.  Review your medicines with your doctor. Some medicines can make you feel dizzy. This can increase your chance of falling. Ask your doctor what other things that you can do to help prevent falls. This information is not intended to replace advice given to you by your health care provider. Make sure you discuss any questions you have with your health care provider. Document Released: 11/01/2008 Document Revised: 06/13/2015 Document Reviewed: 02/09/2014 Elsevier Interactive Patient Education  2017 [employer].

## 2016-10-14 NOTE — Telephone Encounter (Signed)
This has been faxed twice already. Faxed a third time today.

## 2016-10-14 NOTE — Progress Notes (Signed)
Subjective:   April Watson is a 56 y.o. female who presents for Medicare Annual (Subsequent) preventive examination.  Review of Systems:  Cardiac Risk Factors include: diabetes mellitus;dyslipidemia;hypertension;obesity (BMI >30kg/m2)     Objective:     Vitals: BP 126/72   Pulse 78   Temp 97.9 F (36.6 C) (Temporal)   There is no height or weight on file to calculate BMI. Unable to obtain height or weight due to patient unable to stand   Tobacco History  Smoking Status  . Never Smoker  Smokeless Tobacco  . Never Used     Counseling given: Not Answered   Past Medical History:  Diagnosis Date  . 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 Chronic constipation   . Congenital hydrocephalus ([place])   . GERD (gastroesophageal reflux disease)   . Hyperlipidemia   . Hypertension   . Mental retardation   . Seizure disorder ([place])   . Seizures ([place])    Past Surgical History:  Procedure Laterality Date  . COLONOSCOPY  02/08/06   Rourk-mild anal stenosis, multiple erosions o fprox rectum and sigmoid. Long redundant colon. Bx  ?ischemic colitis  . COLONOSCOPY  06/12/2011   Procedure: COLONOSCOPY;  Surgeon: Daneil Dolin, MD;  Location: [facility];  Service: Endoscopy;  Laterality: N/A;  11:00  . FLEXIBLE SIGMOIDOSCOPY  08/14/2003   Rourk- Normal-appearing rectum, normal-appearing sigmoid colon to 50  cm.  Relatively poor prep made exam more difficult  . patient reports that they had brain surgery attempted for hydrocephalus in infancy. However , the procedure was aborted because she coded. .     Family History  Problem Relation Age of Onset  . Kidney failure Mother        on dialysis  . Hypertension Mother   . Kidney disease Mother   . Prostate cancer Father   . Hypertension Father   . Glaucoma Father   . Colon cancer Sister 52  . Multiple myeloma Sister   . Hypertension Sister   . Glaucoma Sister   . Allergies Sister   . Hypertension Sister   .  Glaucoma Sister   . Hyperlipidemia Sister   . Hypertension Brother   . Allergies Brother   . Hyperlipidemia Brother   . Colon cancer Unknown        family history   . Lymphoma Unknown        family history    History  Sexual Activity  . Sexual activity: No    Outpatient Encounter Prescriptions as of 10/14/2016  Medication Sig  . bisoprolol-hydrochlorothiazide (ZIAC) 5-6.25 MG tablet TAKE (1) TABLET BY MOUTH ONCE DAILY FOR BLOOD PRESSURE.  . clotrimazole-betamethasone (LOTRISONE) cream Apply twice daily to affected area x 1 week and then as needed  . docusate sodium (STOOL SOFTENER) 100 MG capsule TAKE 1 CAPSULE TWICE DAILY FOR CONSTIPATION.  Marland Kitchen Incontinence Supply Disposable (UNDERPADS) MISC To use as needed  . montelukast (SINGULAIR) 10 MG tablet TAKE (1) TABLET BY MOUTH AT BEDTIME.  Marland Kitchen nystatin (MYCOSTATIN) powder Apply topically 4 (four) times daily. Apply three times daily to rash in groin for 1 week, then as needed  . PHENobarbital (LUMINAL) 32.4 MG tablet Take 1 tablet (32.4 mg total) by mouth 2 (two) times daily.  . polyethylene glycol powder (GLYCOLAX/MIRALAX) powder MIX 1 CAPFUL (17 G) IN 8 OUNCES OF WATER/JUICE AND DRINK ONCE DAILY FOR CONSTIPATION.  Marland Kitchen potassium chloride (K-DUR) 10 MEQ tablet TAKE (1) TABLET BY  MOUTH TWICE DAILY WITH FOOD.  Marland Kitchen ranitidine (ZANTAC) 150 MG tablet TAKE 1 TABLET BY MOUTH ONCE A DAY AT BEDTIME.  . simvastatin (ZOCOR) 10 MG tablet TAKE (1) TABLET BY MOUTH AT BEDTIME FOR CHOLESTEROL.  Marland Kitchen UNABLE TO FIND Pullups- to use daily as needed  . UNABLE TO FIND Replacement hospital bed mattress  . UNABLE TO FIND Electric Hoyer Lift Dx Q03.9, G82.50  . Vitamin D, Ergocalciferol, (DRISDOL) 50000 units CAPS capsule TAKE 1 CAPSULE BY MOUTH EVERY 2 WEEKS.  . [DISCONTINUED] PHENobarbital (LUMINAL) 32.4 MG tablet TAKE ONE TABLET BY MOUTH TWICE DAILY FOR SEIZURE DISORDER.   No facility-administered encounter medications on file as of 10/14/2016.     Activities of  Daily Living In your present state of health, do you have any difficulty performing the following activities: 10/14/2016  Hearing? N  Vision? N  Difficulty concentrating or making decisions? Y  Walking or climbing stairs? Y  Dressing or bathing? Y  Doing errands, shopping? Y  Preparing Food and eating ? Y  Using the Toilet? Y  In the past six months, have you accidently leaked urine? Y  Do you have problems with loss of bowel control? Y  Managing your Medications? Y  Managing your Finances? Y  Housekeeping or managing your Housekeeping? Y  Some recent data might be hidden    Patient Care Team: Fayrene Helper, MD as PCP - General Rourk, Cristopher Estimable, MD (Gastroenterology)    Assessment:    Exercise Activities and Dietary recommendations Current Exercise Habits: Home exercise routine, Type of exercise: stretching, Time (Minutes): 10, Frequency (Times/Week): 5, Weekly Exercise (Minutes/Week): 50, Intensity: Mild, Exercise limited by: Other - see comments  Goals    None     Fall Risk Fall Risk  10/14/2016 09/17/2015 11/17/2012  Falls in the past year? Yes No Exclusion - non ambulatory  Number falls in past yr: 1 - -  Injury with Fall? 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 H1N1 12/23/2007, 02/22/2008  . Influenza Split 10/24/2010, 11/16/2011, 11/04/2013  . Influenza Whole  10/18/2009  . Influenza,inj,Quad PF,6+ Mos 10/17/2012, 12/19/2014, 10/09/2015, 09/08/2016  . Pneumococcal Conjugate-13 06/19/2014  . Pneumococcal Polysaccharide-23 10/14/2016  . Td 05/19/2006   Screening Tests Health Maintenance  Topic Date Due  . URINE MICROALBUMIN  04/16/2017 (Originally [birth year]/1969)  . MAMMOGRAM  10/27/2017 (Originally 12/30/2009)  . OPHTHALMOLOGY EXAM  12/14/2017 (Originally 12/30/1969)  . Hepatitis C Screening  02/02/2018 (Originally [birth year])  . HIV Screening  02/02/2018 (Originally 12/31/1974)  . FOOT EXAM  04/07/2018 (Originally 12/19/2015)  . PAP SMEAR  11/15/2020 (Originally 12/30/1980)  . TETANUS/TDAP  05/17/2026 (Originally 05/18/2016)  . HEMOGLOBIN A1C  03/14/2017  . COLONOSCOPY  06/11/2021  . PNEUMOCOCCAL POLYSACCHARIDE VACCINE (2) 10/14/2021  . INFLUENZA VACCINE  Completed      Plan:   I have personally reviewed and noted the following in the patient's chart:   . Medical and  social history . Use of alcohol, tobacco or illicit drugs  . Current medications and supplements . Functional ability and status . Nutritional status . Physical activity . Advanced directives . List of other physicians . Hospitalizations, surgeries, and ER visits in previous 12 months . Vitals . Screenings to include cognitive, depression, and falls . Referrals and appointments  In addition, I have reviewed and discussed with patient certain preventive protocols, quality metrics, and best practice recommendations. A written personalized care plan for preventive services as well as general preventive health recommendations were provided to patient.     Stormy Fabian, LPN  [id number]

## 2016-10-15 ENCOUNTER — Ambulatory Visit: Payer: Medicare Other

## 2016-10-20 ENCOUNTER — Telehealth: Payer: Self-pay | Admitting: Family Medicine

## 2016-10-20 NOTE — Telephone Encounter (Signed)
April Watson  Calling again in ref to the Rx  (see note for 10/14/16) she is not receiving.  Please call and speak with her.  Fax number was verified.  The [facility] Hospital bed mattress (were being sent to [facility] and [facility])   Please not just HOYA LIFT (not electric)    cb  [phone]  At this point she would like both sent to her please check fax  [phone].

## 2016-10-22 ENCOUNTER — Encounter: Payer: Self-pay | Admitting: Family Medicine

## 2016-10-22 MED ORDER — UNABLE TO FIND: 0 refills | Status: DC

## 2016-10-22 NOTE — Telephone Encounter (Signed)
Faxed

## 2016-11-10 ENCOUNTER — Other Ambulatory Visit: Payer: Self-pay | Admitting: Family Medicine

## 2016-11-13 ENCOUNTER — Telehealth: Payer: Self-pay | Admitting: Family Medicine

## 2016-11-13 NOTE — Telephone Encounter (Signed)
Patient's sister calling regarding patients diapers and pads.  She thinks that the papers have been sent for the request from [place] but the doctor has not signed and sent back.

## 2016-11-16 ENCOUNTER — Telehealth: Payer: Self-pay

## 2016-11-16 ENCOUNTER — Other Ambulatory Visit: Payer: Self-pay

## 2016-11-16 ENCOUNTER — Encounter: Payer: Self-pay | Admitting: Family Medicine

## 2016-11-16 MED ORDER — UNABLE TO FIND: 0 refills | Status: DC

## 2016-11-16 MED ORDER — UNABLE TO FIND: 99 refills | Status: DC

## 2016-11-16 NOTE — Telephone Encounter (Signed)
Sent!

## 2016-11-26 NOTE — Telephone Encounter (Signed)
Mychart message forwarded.

## 2016-12-02 ENCOUNTER — Encounter: Payer: Self-pay | Admitting: Family Medicine

## 2016-12-03 ENCOUNTER — Telehealth: Payer: Self-pay

## 2016-12-03 DIAGNOSIS — G8250 Quadriplegia, unspecified: Secondary | ICD-10-CM

## 2016-12-04 NOTE — Telephone Encounter (Signed)
Script signed and faxed.

## 2016-12-11 ENCOUNTER — Other Ambulatory Visit: Payer: Self-pay | Admitting: Family Medicine

## 2016-12-15 ENCOUNTER — Encounter: Payer: Self-pay | Admitting: Family Medicine

## 2016-12-16 ENCOUNTER — Telehealth: Payer: Self-pay | Admitting: *Deleted

## 2016-12-16 NOTE — Telephone Encounter (Signed)
[phone] attention Rebecca, pls  Re send note , did not receive

## 2016-12-16 NOTE — Telephone Encounter (Signed)
Sent over

## 2016-12-16 NOTE — Telephone Encounter (Signed)
Wells Guiles called left message from [facility] regarding patient's new wheel chair, per Wells Guiles they need supporting notes for medicare stating why she needs a wheel chair. Wells Guiles states the brakes no longer works on patient's old wheel chair. Wells Guiles also stated they need this done by Friday due to the deductible on patient's insurance. Please call Wells Guiles at [phone] or fax the notes to [phone].  Last office note was sent by [facility] yesterday, [place] still needs more information.

## 2017-01-11 ENCOUNTER — Other Ambulatory Visit: Payer: Self-pay | Admitting: Family Medicine

## 2017-01-13 NOTE — Telephone Encounter (Signed)
Seen [phone] 

## 2017-02-25 ENCOUNTER — Other Ambulatory Visit: Payer: Self-pay | Admitting: Family Medicine

## 2017-03-15 ENCOUNTER — Other Ambulatory Visit: Payer: Self-pay | Admitting: Family Medicine

## 2017-03-16 ENCOUNTER — Telehealth: Payer: Self-pay

## 2017-03-16 DIAGNOSIS — E559 Vitamin D deficiency, unspecified: Secondary | ICD-10-CM | POA: Diagnosis not present

## 2017-03-16 DIAGNOSIS — I10 Essential (primary) hypertension: Secondary | ICD-10-CM

## 2017-03-16 DIAGNOSIS — E1169 Type 2 diabetes mellitus with other specified complication: Secondary | ICD-10-CM

## 2017-03-16 DIAGNOSIS — E669 Obesity, unspecified: Secondary | ICD-10-CM

## 2017-03-16 DIAGNOSIS — E785 Hyperlipidemia, unspecified: Secondary | ICD-10-CM

## 2017-03-16 NOTE — Telephone Encounter (Signed)
Labs ordered.

## 2017-03-17 LAB — LIPID PANEL
CHOL/HDL RATIO: 4.5 (calc) (ref <5.0)
CHOLESTEROL: 154 mg/dL (ref <200)
HDL: 34 mg/dL — ABNORMAL LOW (ref >50)
LDL CHOLESTEROL (CALC): 98 mg/dL
Non-HDL Cholesterol (Calc): 120 mg/dL (calc) (ref <130)
Triglycerides: 119 mg/dL (ref <150)

## 2017-03-17 LAB — COMPLETE METABOLIC PANEL WITH GFR
AG Ratio: 1.4 (calc) (ref 1.0–2.5)
ALBUMIN MSPROF: 4 g/dL (ref 3.6–5.1)
ALT: 11 U/L (ref 6–29)
AST: 13 U/L (ref 10–35)
Alkaline phosphatase (APISO): 68 U/L (ref 33–130)
BUN: 14 mg/dL (ref 7–25)
CO2: 31 mmol/L (ref 20–32)
CREATININE: 0.69 mg/dL (ref 0.50–1.05)
Calcium: 9.5 mg/dL (ref 8.6–10.4)
Chloride: 102 mmol/L (ref 98–110)
GFR, EST AFRICAN AMERICAN: 112 mL/min/{1.73_m2} (ref >=60)
GFR, Est Non African American: 97 mL/min/{1.73_m2} (ref >=60)
Globulin: 2.8 g/dL (calc) (ref 1.9–3.7)
Glucose, Bld: 103 mg/dL — ABNORMAL HIGH (ref 65–99)
Potassium: 3.9 mmol/L (ref 3.5–5.3)
Sodium: 141 mmol/L (ref 135–146)
TOTAL PROTEIN: 6.8 g/dL (ref 6.1–8.1)
Total Bilirubin: 0.4 mg/dL (ref 0.2–1.2)

## 2017-03-17 LAB — HEMOGLOBIN A1C
EAG (MMOL/L): 8.1 (calc)
Hgb A1c MFr Bld: 6.7 % of total Hgb — ABNORMAL HIGH (ref <5.7)
Mean Plasma Glucose: 146 (calc)

## 2017-03-17 LAB — VITAMIN D 25 HYDROXY (VIT D DEFICIENCY, FRACTURES): VIT D 25 HYDROXY: 67 ng/mL (ref 30–100)

## 2017-03-29 ENCOUNTER — Other Ambulatory Visit: Payer: Self-pay | Admitting: Family Medicine

## 2017-03-30 ENCOUNTER — Other Ambulatory Visit: Payer: Self-pay | Admitting: Family Medicine

## 2017-03-30 ENCOUNTER — Telehealth: Payer: Self-pay | Admitting: Family Medicine

## 2017-03-30 NOTE — Telephone Encounter (Signed)
Waiting for dr to sign and then will fax

## 2017-03-30 NOTE — Telephone Encounter (Signed)
Patients sister [name] called in to check on the status of a medication she states the pharmacy has been trying to fill for 3 days. [facility] is [facility] pharmacy.  PHENobarbital (LUMINAL) 32.4 MG tablet  Is the medication  Patient will be completely out after tomorrow.  Please call [name] back [phone]

## 2017-04-01 ENCOUNTER — Ambulatory Visit: Payer: Medicare Other | Admitting: Family Medicine

## 2017-04-08 ENCOUNTER — Encounter: Payer: Self-pay | Admitting: Family Medicine

## 2017-04-08 ENCOUNTER — Ambulatory Visit (INDEPENDENT_AMBULATORY_CARE_PROVIDER_SITE_OTHER): Payer: Medicare Other | Admitting: Family Medicine

## 2017-04-08 VITALS — BP 122/80 | HR 67 | Temp 99.2°F | Resp 16

## 2017-04-08 DIAGNOSIS — R05 Cough: Secondary | ICD-10-CM

## 2017-04-08 DIAGNOSIS — E559 Vitamin D deficiency, unspecified: Secondary | ICD-10-CM

## 2017-04-08 DIAGNOSIS — G8250 Quadriplegia, unspecified: Secondary | ICD-10-CM

## 2017-04-08 DIAGNOSIS — E785 Hyperlipidemia, unspecified: Secondary | ICD-10-CM | POA: Diagnosis not present

## 2017-04-08 DIAGNOSIS — E1149 Type 2 diabetes mellitus with other diabetic neurological complication: Secondary | ICD-10-CM

## 2017-04-08 DIAGNOSIS — I10 Essential (primary) hypertension: Secondary | ICD-10-CM | POA: Diagnosis not present

## 2017-04-08 DIAGNOSIS — R058 Other specified cough: Secondary | ICD-10-CM

## 2017-04-08 MED ORDER — PREDNISONE 5 MG PO TABS: ORAL_TABLET | ORAL | 0 refills | Status: DC

## 2017-04-08 MED ORDER — BENZONATATE 100 MG PO CAPS: 100.0000 mg | ORAL_CAPSULE | Freq: Two times a day (BID) | ORAL | 2 refills | Status: DC | PRN

## 2017-04-08 MED ORDER — MONTELUKAST SODIUM 10 MG PO TABS: 10.0000 mg | ORAL_TABLET | Freq: Every day | ORAL | 3 refills | Status: DC

## 2017-04-08 NOTE — Patient Instructions (Addendum)
  Blood work and exam are great  New for cough due to allergies is tessalon perle as needed, and prednisone dose pack  Fasting cBC, TSH, lipid, cmp and EGFr and HBA1C  2nd week in [place]      MD follow up and wellness with nurse same day Sept 27 or after

## 2017-04-18 ENCOUNTER — Encounter: Payer: Self-pay | Admitting: Family Medicine

## 2017-04-18 NOTE — Assessment & Plan Note (Signed)
Continue current regime

## 2017-04-18 NOTE — Assessment & Plan Note (Signed)
April Watson remains well controlled on diet only   Diabetic Labs Latest Ref Rng & Units 03/16/2017 09/11/2016 02/01/2016 12/12/2014 06/16/2014  HbA1c <5.7 % of total Hgb 6.7(H) 6.4(H) 6.7(H) 6.2(H) 6.1(H)  Chol <200 mg/dL 154 164 173 170 159  HDL >50 mg/dL 34(L) 47(L) 49(L) 48 52  Calc LDL mg/dL (calc) 98 98 103(H) 103 91  Triglycerides <150 mg/dL 119 94 107 96 80  Creatinine 0.50 - 1.05 mg/dL 0.69 0.69 0.80 0.64 0.62   BP/Weight 04/08/2017 10/14/2016 09/08/2016 02/19/2016 10/09/2015 08/22/2015 9/62/2297  Systolic BP 989 211 941 740 814 481 856  Diastolic BP 80 72 70 68 70 84 78  Wt. (Lbs) - - - - - - -   No flowsheet data found.

## 2017-04-18 NOTE — Assessment & Plan Note (Signed)
Family and caregiver to continue attention to passive ROM exercises to reduce amount of contracturees and likelihood of skin breakdown or infection

## 2017-04-18 NOTE — Assessment & Plan Note (Signed)
Hyperlipidemia:Low fat diet discussed and encouraged.   Lipid Panel  Lab Results  Component Value Date   CHOL 154 03/16/2017   HDL 34 (L) 03/16/2017   LDLCALC 98 03/16/2017   TRIG 119 03/16/2017   CHOLHDL 4.5 03/16/2017   Controlled, no change in medication

## 2017-04-18 NOTE — Assessment & Plan Note (Signed)
Controlled, no change in medication  

## 2017-04-18 NOTE — Assessment & Plan Note (Signed)
Tessalon perles for as needed use only. Family to call if patient becomes febrile or decompensates

## 2017-04-18 NOTE — Progress Notes (Signed)
   April Watson     MRN: [id number]      DOB: [birth year]   HPI Patient has Cerebral Palsy, history is from caregiver and family members Ms. Closser is here for follow up and re-evaluation of chronic medical conditions, medication management and review of any available recent lab data.  1 week h/o increased cough, no h/o fever, reduced appetite or interaction with caregivers Denies any skin breakdown.     ROS See HPI Denies recent fever or chills. Denies sinus pressure, nasal congestion, . Denies  leg swelling Denies abdominal pain, nausea, vomiting,diarrhea or constipation. Bowel movements are regular Denies dysuria, frequency, malodorous urine, has incontinence. C/o limitation in mobility.  Denies skin break down or rash.   PE  BP 122/80   Pulse 67   Temp 99.2 F (37.3 C) (Oral)   Resp 16   SpO2 98%   Patient alert  and in no cardiopulmonary distress.Smiling happily, coughing occasionally  HEENT: No facial asymmetry, EOMI,   oropharynx pink and moist.  Neck decreased ROM no JVD, no mass.  Chest: Clear to auscultation bilaterally.  CVS: S1, S2 no murmurs, no S3.Regular rate.  ABD: Soft non tender.   Ext: No edema  JF:[id number] ROM spine, shoulders, hips and knees.  Skin: Intact, no ulcerations or rash noted.   Assessment & Plan Essential hypertension Controlled, no change in medication   Type 2 diabetes mellitus with neurological complications ([place]) Ms. Homesley remains well controlled on diet only   Diabetic Labs Latest Ref Rng & Units 03/16/2017 09/11/2016 02/01/2016 12/12/2014 06/16/2014  HbA1c <5.7 % of total Hgb 6.7(H) 6.4(H) 6.7(H) 6.2(H) 6.1(H)  Chol <200 mg/dL 154 164 173 170 159  HDL >50 mg/dL 34(L) 47(L) 49(L) 48 52  Calc LDL mg/dL (calc) 98 98 103(H) 103 91  Triglycerides <150 mg/dL 119 94 107 96 80  Creatinine 0.50 - 1.05 mg/dL 0.69 0.69 0.80 0.64 0.62   BP/Weight 04/08/2017 10/14/2016 09/08/2016 02/19/2016 10/09/2015 08/22/2015 8/93/7342  Systolic BP  876 811 572 620 355 974 163  Diastolic BP 80 72 70 68 70 84 78  Wt. (Lbs) - - - - - - -   No flowsheet data found.      Vitamin D deficiency Continue current regime  Allergic cough Tessalon perles for as needed use only. Family to call if patient becomes febrile or decompensates  Hyperlipidemia LDL goal <100 Hyperlipidemia:Low fat diet discussed and encouraged.   Lipid Panel  Lab Results  Component Value Date   CHOL 154 03/16/2017   HDL 34 (L) 03/16/2017   LDLCALC 98 03/16/2017   TRIG 119 03/16/2017   CHOLHDL 4.5 03/16/2017   Controlled, no change in medication     Quadriplegia and quadriparesis ([place]) Family and caregiver to continue attention to passive ROM exercises to reduce amount of contracturees and likelihood of skin breakdown or infection

## 2017-05-27 ENCOUNTER — Other Ambulatory Visit: Payer: Self-pay | Admitting: Family Medicine

## 2017-06-14 ENCOUNTER — Other Ambulatory Visit: Payer: Self-pay | Admitting: Family Medicine

## 2017-06-30 ENCOUNTER — Other Ambulatory Visit: Payer: Self-pay | Admitting: Family Medicine

## 2017-07-28 ENCOUNTER — Other Ambulatory Visit: Payer: Self-pay | Admitting: Family Medicine

## 2017-08-26 ENCOUNTER — Other Ambulatory Visit: Payer: Self-pay | Admitting: Family Medicine

## 2017-08-28 ENCOUNTER — Encounter: Payer: Self-pay | Admitting: Family Medicine

## 2017-08-30 ENCOUNTER — Other Ambulatory Visit: Payer: Self-pay

## 2017-08-30 MED ORDER — UNABLE TO FIND: 0 refills | Status: DC

## 2017-09-13 ENCOUNTER — Other Ambulatory Visit: Payer: Self-pay | Admitting: Family Medicine

## 2017-09-24 ENCOUNTER — Other Ambulatory Visit: Payer: Self-pay

## 2017-09-24 ENCOUNTER — Telehealth: Payer: Self-pay

## 2017-09-24 DIAGNOSIS — I10 Essential (primary) hypertension: Secondary | ICD-10-CM

## 2017-09-24 DIAGNOSIS — E785 Hyperlipidemia, unspecified: Secondary | ICD-10-CM

## 2017-09-24 DIAGNOSIS — E1149 Type 2 diabetes mellitus with other diabetic neurological complication: Secondary | ICD-10-CM

## 2017-09-24 MED ORDER — UNABLE TO FIND: 0 refills | Status: DC

## 2017-09-24 NOTE — Telephone Encounter (Signed)
Labs ordered.

## 2017-09-28 ENCOUNTER — Other Ambulatory Visit: Payer: Self-pay | Admitting: Family Medicine

## 2017-09-30 ENCOUNTER — Other Ambulatory Visit: Payer: Self-pay

## 2017-09-30 MED ORDER — PHENOBARBITAL 32.4 MG PO TABS: ORAL_TABLET | ORAL | 0 refills | Status: DC

## 2017-10-06 DIAGNOSIS — I10 Essential (primary) hypertension: Secondary | ICD-10-CM | POA: Diagnosis not present

## 2017-10-06 DIAGNOSIS — E785 Hyperlipidemia, unspecified: Secondary | ICD-10-CM | POA: Diagnosis not present

## 2017-10-06 DIAGNOSIS — E1149 Type 2 diabetes mellitus with other diabetic neurological complication: Secondary | ICD-10-CM | POA: Diagnosis not present

## 2017-10-07 ENCOUNTER — Encounter: Payer: Self-pay | Admitting: Family Medicine

## 2017-10-07 LAB — CBC
HEMATOCRIT: 43 % (ref 35.0–45.0)
HEMOGLOBIN: 14.2 g/dL (ref 11.7–15.5)
MCH: 28.5 pg (ref 27.0–33.0)
MCHC: 33 g/dL (ref 32.0–36.0)
MCV: 86.2 fL (ref 80.0–100.0)
MPV: 11.6 fL (ref 7.5–12.5)
Platelets: 180 10*3/uL (ref 140–400)
RBC: 4.99 10*6/uL (ref 3.80–5.10)
RDW: 12.6 % (ref 11.0–15.0)
WBC: 9.2 10*3/uL (ref 3.8–10.8)

## 2017-10-07 LAB — LIPID PANEL
CHOL/HDL RATIO: 4.2 (calc) (ref <5.0)
Cholesterol: 156 mg/dL (ref <200)
HDL: 37 mg/dL — AB (ref >50)
LDL CHOLESTEROL (CALC): 99 mg/dL
Non-HDL Cholesterol (Calc): 119 mg/dL (calc) (ref <130)
TRIGLYCERIDES: 106 mg/dL (ref <150)

## 2017-10-07 LAB — HEMOGLOBIN A1C
EAG (MMOL/L): 9.2 (calc)
Hgb A1c MFr Bld: 7.4 % of total Hgb — ABNORMAL HIGH (ref <5.7)
Mean Plasma Glucose: 166 (calc)

## 2017-10-07 LAB — COMPLETE METABOLIC PANEL WITH GFR
AG RATIO: 1.4 (calc) (ref 1.0–2.5)
ALKALINE PHOSPHATASE (APISO): 62 U/L (ref 33–130)
ALT: 14 U/L (ref 6–29)
AST: 13 U/L (ref 10–35)
Albumin: 4 g/dL (ref 3.6–5.1)
BILIRUBIN TOTAL: 0.4 mg/dL (ref 0.2–1.2)
BUN: 9 mg/dL (ref 7–25)
CALCIUM: 9.5 mg/dL (ref 8.6–10.4)
CHLORIDE: 101 mmol/L (ref 98–110)
CO2: 30 mmol/L (ref 20–32)
Creat: 0.63 mg/dL (ref 0.50–1.05)
GFR, EST NON AFRICAN AMERICAN: 100 mL/min/{1.73_m2} (ref >=60)
GFR, Est African American: 115 mL/min/{1.73_m2} (ref >=60)
GLOBULIN: 2.9 g/dL (ref 1.9–3.7)
Glucose, Bld: 118 mg/dL — ABNORMAL HIGH (ref 65–99)
POTASSIUM: 3.4 mmol/L — AB (ref 3.5–5.3)
SODIUM: 141 mmol/L (ref 135–146)
Total Protein: 6.9 g/dL (ref 6.1–8.1)

## 2017-10-07 LAB — TSH: TSH: 2.85 mIU/L (ref 0.40–4.50)

## 2017-10-18 ENCOUNTER — Ambulatory Visit: Payer: Medicare Other

## 2017-10-18 ENCOUNTER — Ambulatory Visit: Payer: Medicare Other | Admitting: Family Medicine

## 2017-10-25 ENCOUNTER — Encounter: Payer: Self-pay | Admitting: Family Medicine

## 2017-10-25 ENCOUNTER — Ambulatory Visit (INDEPENDENT_AMBULATORY_CARE_PROVIDER_SITE_OTHER): Payer: Medicare Other

## 2017-10-25 ENCOUNTER — Ambulatory Visit (INDEPENDENT_AMBULATORY_CARE_PROVIDER_SITE_OTHER): Payer: Medicare Other | Admitting: Family Medicine

## 2017-10-25 ENCOUNTER — Other Ambulatory Visit: Payer: Self-pay | Admitting: Family Medicine

## 2017-10-25 VITALS — BP 118/80 | HR 82 | Resp 15

## 2017-10-25 VITALS — BP 106/68 | HR 68 | Resp 12

## 2017-10-25 DIAGNOSIS — E1149 Type 2 diabetes mellitus with other diabetic neurological complication: Secondary | ICD-10-CM

## 2017-10-25 DIAGNOSIS — R058 Other specified cough: Secondary | ICD-10-CM

## 2017-10-25 DIAGNOSIS — R05 Cough: Secondary | ICD-10-CM

## 2017-10-25 DIAGNOSIS — Z23 Encounter for immunization: Secondary | ICD-10-CM | POA: Diagnosis not present

## 2017-10-25 DIAGNOSIS — I10 Essential (primary) hypertension: Secondary | ICD-10-CM | POA: Diagnosis not present

## 2017-10-25 DIAGNOSIS — Z0000 Encounter for general adult medical examination without abnormal findings: Secondary | ICD-10-CM

## 2017-10-25 MED ORDER — BENZONATATE 100 MG PO CAPS: 100.0000 mg | ORAL_CAPSULE | Freq: Two times a day (BID) | ORAL | 3 refills | Status: DC | PRN

## 2017-10-25 NOTE — Progress Notes (Signed)
Subjective:   April Watson is a 57 y.o. female who presents for Medicare Annual (Subsequent) preventive examination.  Review of Systems:   Cardiac Risk Factors include: sedentary lifestyle     Objective:     Vitals: BP 118/80   Pulse 82   Resp 15   SpO2 98%   There is no height or weight on file to calculate BMI.  Advanced Directives 10/27/2017 10/14/2016 06/12/2011  Does Patient Have a Medical Advance Directive? Yes No Patient does not have advance directive  Type of Advance Directive Healthcare Power of Attorney - -  Copy of Healthcare Power of Attorney in Chart? No - copy requested - -  Would patient like information on creating a medical advance directive? - Yes (MAU/Ambulatory/Procedural Areas - Information given) -  Pre-existing out of facility DNR order (yellow form or pink MOST form) - - No    Tobacco Social History   Tobacco Use  Smoking Status Never Smoker  Smokeless Tobacco Never Used     Counseling given: Not Answered   Clinical Intake:  Pre-visit preparation completed: Yes  Pain : No/denies pain Pain Score: 0-No pain     Diabetes: No  How often do you need to have someone help you when you read instructions, pamphlets, or other written materials from your doctor or pharmacy?: 5 - Always  Interpreter Needed?: No  Information entered by :: Carnita Golob  Past Medical History:  Diagnosis Date  . 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 Chronic constipation   . Congenital hydrocephalus (HCC)   . GERD (gastroesophageal reflux disease)   . Hyperlipidemia   . Hypertension   . Mental retardation   . Seizure disorder (HCC)   . Seizures (HCC)    Past Surgical History:  Procedure Laterality Date  . COLONOSCOPY  02/08/06   Rourk-mild anal stenosis, multiple erosions o fprox rectum and sigmoid. Long redundant colon. Bx  ?ischemic colitis  . COLONOSCOPY  06/12/2011   Procedure: COLONOSCOPY;  Surgeon: Corbin Ade, MD;  [facility]  [facility];  Service: Endoscopy;  Laterality: N/A;  11:00  . FLEXIBLE SIGMOIDOSCOPY  08/14/2003   Rourk- Normal-appearing rectum, normal-appearing sigmoid colon to 50  cm.  Relatively poor prep made exam more difficult  . patient reports that they had brain surgery attempted for hydrocephalus in infancy. However , the procedure was aborted because she coded. .     Family History  Problem Relation Age of Onset  . Kidney failure Mother        on dialysis  . Hypertension Mother   . Kidney disease Mother   . Prostate cancer Father   . Hypertension Father   . Glaucoma Father   . Colon cancer Sister 88  . Multiple myeloma Sister   . Hypertension Sister   . Glaucoma Sister   . Allergies Sister   . Hypertension Sister   . Glaucoma Sister   . Hyperlipidemia Sister   . Hypertension Brother   . Allergies Brother   . Hyperlipidemia Brother   . Colon cancer Unknown        family history   . Lymphoma Unknown        family history    Social History   Socioeconomic History  . Marital status: Single    Spouse name: Not on file  . Number of children: 0  . Years of education: Not on file  . Highest education level: Not on file  Occupational History  . Occupation: disabled   Social Needs  . Financial resource strain: Not on file  . Food insecurity:    Worry: Not on file    Inability: Not on file  . Transportation needs:    Medical: Not on file    Non-medical: Not on file  Tobacco Use  . Smoking status: Never Smoker  . Smokeless tobacco: Never Used  Substance and Sexual Activity  . Alcohol use: No  . Drug use: No  . Sexual activity: Never  Lifestyle  . Physical activity:    Days per week: Not on file    Minutes per session: Not on file  . Stress: Not on file  Relationships  . Social connections:    Talks on phone: Not on file    Gets together: Not on file    Attends religious service: Not on file    Active member of club or organization: Not on file    Attends meetings  of clubs or organizations: Not on file    Relationship status: Not on file  Other Topics Concern  . Not on file  Social History Narrative   Sisters assist w/ pt care    Outpatient Encounter Medications as of 10/25/2017  Medication Sig  . benzonatate (TESSALON) 100 MG capsule Take 1 capsule (100 mg total) by mouth 2 (two) times daily as needed for cough.  . bisoprolol-hydrochlorothiazide (ZIAC) 5-6.25 MG tablet TAKE (1) TABLET BY MOUTH ONCE DAILY FOR BLOOD PRESSURE.  . clotrimazole-betamethasone (LOTRISONE) cream Apply twice daily to affected area x 1 week and then as needed  . docusate sodium (STOOL SOFTENER) 100 MG capsule TAKE 1 CAPSULE TWICE DAILY FOR CONSTIPATION.  Marland Kitchen Incontinence Supply Disposable (UNDERPADS) MISC To use as needed  . montelukast (SINGULAIR) 10 MG tablet Take 1 tablet (10 mg total) by mouth at bedtime.  Marland Kitchen nystatin (MYCOSTATIN) powder Apply topically 4 (four) times daily. Apply three times daily to rash in groin for 1 week, then as needed  . PHENobarbital (LUMINAL) 32.4 MG tablet TAKE ONE TABLET BY MOUTH TWICE DAILY FOR SEIZURE DISORDER.  Marland Kitchen polyethylene glycol powder (GLYCOLAX/MIRALAX) powder MIX 1 CAPFUL (17 G) IN 8 OUNCES OF WATER/JUICE AND DRINK ONCE DAILY FOR CONSTIPATION.  Marland Kitchen potassium chloride (K-DUR) 10 MEQ tablet TAKE (1) TABLET BY MOUTH TWICE DAILY WITH FOOD.  Marland Kitchen ranitidine (ZANTAC) 150 MG tablet TAKE 1 TABLET BY MOUTH ONCE A DAY AT BEDTIME.  . simvastatin (ZOCOR) 10 MG tablet TAKE (1) TABLET BY MOUTH AT BEDTIME FOR CHOLESTEROL.  Marland Kitchen UNABLE TO FIND Replacement hospital bed mattress  . UNABLE TO FIND Hoyer Lift Dx Q03.9, G82.50  . UNABLE TO FIND Pullups- to use daily as needed  . UNABLE TO FIND Underpads to use as needed  . UNABLE TO FIND Lift chair x 1 Dx M24.50, G82.50  . Vitamin D, Ergocalciferol, (DRISDOL) 50000 units CAPS capsule TAKE 1 CAPSULE BY MOUTH EVERY 2 WEEKS.  . [DISCONTINUED] benzonatate (TESSALON) 100 MG capsule Take 1 capsule (100 mg total) by  mouth 2 (two) times daily as needed for cough.   No facility-administered encounter medications on file as of 10/25/2017.     Activities of Daily Living In your present state of health, do you have any difficulty performing the following activities: 10/27/2017  Hearing? N  Vision? Y  Difficulty concentrating or making decisions? Y  Walking or climbing stairs? Y  Dressing or bathing? Y  Doing errands, shopping? Y  Preparing Food and eating ? Jeannie Fend  Using the Toilet? Y  In the past six months, have you accidently leaked urine? Y  Do you have problems with loss of bowel control? Y  Managing your Medications? Y  Managing your Finances? Y  Housekeeping or managing your Housekeeping? Y  Comment completely dependent on caregivers  Some recent data might be hidden    Patient Care Team: Kerri Perches, MD as PCP - General Rourk, Gerrit Friends, MD (Gastroenterology)    Assessment:   This is a routine wellness examination for April Watson.  Exercise Activities and Dietary recommendations Current Exercise Habits: The patient does not participate in regular exercise at present, Exercise limited by: neurologic condition(s);Other - see comments(congenital hydrocephalus, flexion contractures)  Goals   None     Fall Risk Fall Risk  10/27/2017 10/25/2017 04/08/2017 04/08/2017 10/14/2016  Falls in the past year? No No Exclusion - non ambulatory Exclusion - non ambulatory Yes  Number falls in past yr: - - - - 1  Injury with Fall? - - - - No  Risk for fall due to : - - - - Impaired balance/gait;Impaired mobility  Follow up - - - - Falls evaluation completed;Education provided;Falls prevention discussed   Is the patient's home free of loose throw rugs in walkways, pet beds, electrical cords, etc?   yes      Grab bars in the bathroom? yes      Handrails on the stairs?   yes      Adequate lighting?   yes  Timed Get Up and Go performed:   Depression Screen PHQ 2/9 Scores 10/27/2017 04/08/2017 04/08/2017  10/14/2016  PHQ - 2 Score - - - -  Exception Documentation Medical reason Medical reason Medical reason Medical reason  Not completed - - - -     Cognitive Function MMSE - Mini Mental State Exam 10/14/2016  Not completed: Unable to complete        Immunization History  Administered Date(s) Administered  . H1N1 12/23/2007, 02/22/2008  . Influenza Split 10/24/2010, 11/16/2011, 11/04/2013  . Influenza Whole 10/18/2009  . Influenza,inj,Quad PF,6+ Mos 10/17/2012, 12/19/2014, 10/09/2015, 09/08/2016, 10/25/2017  . Pneumococcal Conjugate-13 06/19/2014  . Pneumococcal Polysaccharide-23 10/14/2016  . Td 05/19/2006    Qualifies for Shingles Vaccine?  Screening Tests Health Maintenance  Topic Date Due  . MAMMOGRAM  10/27/2017 (Originally 12/30/2009)  . OPHTHALMOLOGY EXAM  12/14/2017 (Originally 12/30/1969)  . Hepatitis C Screening  02/02/2018 (Originally 15-May-1960)  . HIV Screening  02/02/2018 (Originally 12/31/1974)  . FOOT EXAM  04/07/2018 (Originally 12/19/2015)  . PAP SMEAR  11/15/2020 (Originally 12/30/1980)  . TETANUS/TDAP  05/17/2026 (Originally 05/18/2016)  . HEMOGLOBIN A1C  04/06/2018  . COLONOSCOPY  06/11/2021  . INFLUENZA VACCINE  Completed  . PNEUMOCOCCAL POLYSACCHARIDE VACCINE AGE 51-64 HIGH RISK  Completed    Cancer Screenings: Lung: Low Dose CT Chest recommended if Age 22-80 years, 30 pack-year currently smoking OR have quit w/in 15years. Patient does not qualify. Breast:  Up to date on Mammogram? No  declines Up to date of Bone Density/Dexa? No declines Colorectal: declines  Additional Screenings:  Hepatitis C Screening:      Plan:     I have personally reviewed and noted the following in the patient's chart:   . Medical and social history . Use of alcohol, tobacco or illicit drugs  . Current medications and supplements . Functional ability and status . Nutritional status . Physical activity . Advanced directives . List of other  physicians . Hospitalizations, surgeries, and ER visits in  previous 12 months . Vitals . Screenings to include cognitive, depression, and falls . Referrals and appointments  In addition, I have reviewed and discussed with patient certain preventive protocols, quality metrics, and best practice recommendations. A written personalized care plan for preventive services as well as general preventive health recommendations were provided to patient.     Everitt Amber, LPN, LPN  56/02/1306

## 2017-10-25 NOTE — Patient Instructions (Addendum)
  MD follow up in 5.5 months, call if you need me sooner  Flu vaccine today  Need to stop sugar , sweets, ice cream and sweet drinks, blood sugfar has increased  Get oTC sudafed , and take one tablet once daily for about 3 days , to see if this reduces drainage and cough, and then as needed.  Continue all other medication  Non fast HBA1C, and chem 7 and eGFR 5 to 7 days before your next visit

## 2017-10-25 NOTE — Assessment & Plan Note (Signed)
Deteriorated , needs to reduce sugar ion diet and portion size of starchy foods

## 2017-10-25 NOTE — Progress Notes (Signed)
Subjective

## 2017-10-25 NOTE — Assessment & Plan Note (Signed)
Uncontrolled , add  Intermittent sudafed one daily

## 2017-10-25 NOTE — Progress Notes (Signed)
   April Watson     MRN: [id number]      DOB: 03-12-1960   HPI Non verbal, sister and caregiver provide history April Watson is here for follow up and re-evaluation of chronic medical conditions, medication management and review of any available recent lab  data.  Preventive health is updated, specifically  Immunization.    The PT denies any adverse reactions to current medications since the last visit.  C/o intermittent early am cough ROS Denies recent fever or chills. Denies sinus pressure, nasal congestion, Denies chest congestion, productive cough or wheezing. Denies  leg swelling Denies abdominal pain, nausea, vomiting,diarrhea or constipation.   Denies dysuria, frequency, hesitancy or incontinence. Chronic  limitation in mobility. Deniesseizures, Denies depression, anxiety or insomnia. Denies skin break down or rash.   PE  BP 106/68 (BP Location: Left Arm, Patient Position: Sitting, Cuff Size: Normal)   Pulse 68   Resp 12   SpO2 94% Comment: room air  Patient alert ted and in no cardiopulmonary distress.  HEENT: No facial asymmetry, EOMI,   oropharynx pink and moist.  Neck decreased rOM no JVD, no mass.  Chest: Clear to auscultation bilaterally.  CVS: S1, S2 no murmurs, no S3.Regular rate.  ABD: Soft non tender.Obese   Ext: No edema  LK:[id number]  ROM spine, shoulders, hips and knees.  Skin: Intact, no ulcerations or rash noted.  Psych: Good eye contact    Assessment & Plan  Type 2 diabetes mellitus with neurological complications (HCC) Deteriorated , needs to reduce sugar ion diet and portion size of starchy foods  Essential hypertension Controlled, no change in medication   Allergic cough Uncontrolled , add  Intermittent sudafed one daily

## 2017-10-25 NOTE — Assessment & Plan Note (Signed)
Controlled, no change in medication  

## 2017-11-03 ENCOUNTER — Telehealth: Payer: Self-pay | Admitting: Family Medicine

## 2017-11-03 ENCOUNTER — Other Ambulatory Visit: Payer: Self-pay

## 2017-11-03 MED ORDER — UNABLE TO FIND: 0 refills | Status: DC

## 2017-11-03 MED ORDER — UNABLE TO FIND: 99 refills | Status: DC

## 2017-11-03 NOTE — Telephone Encounter (Signed)
Scripts printed and faxed °

## 2017-11-03 NOTE — Telephone Encounter (Signed)
April Watson is calling with [facility] ,, she needs a fax prescription for April Watson, for Underpads, and pullups ,  Please fax to [phone]

## 2017-11-04 ENCOUNTER — Other Ambulatory Visit: Payer: Self-pay

## 2017-11-04 MED ORDER — UNABLE TO FIND: 12 refills | Status: DC

## 2017-11-15 ENCOUNTER — Encounter: Payer: Self-pay | Admitting: Family Medicine

## 2017-11-17 ENCOUNTER — Other Ambulatory Visit: Payer: Self-pay | Admitting: Family Medicine

## 2017-11-17 MED ORDER — POTASSIUM CHLORIDE 20 MEQ/15ML (10%) PO SOLN: 10.0000 meq | Freq: Two times a day (BID) | ORAL | 5 refills | Status: DC

## 2017-11-17 NOTE — Progress Notes (Unsigned)
klor con liq

## 2017-12-23 ENCOUNTER — Other Ambulatory Visit: Payer: Self-pay

## 2017-12-23 DIAGNOSIS — E785 Hyperlipidemia, unspecified: Secondary | ICD-10-CM

## 2017-12-23 MED ORDER — POTASSIUM CHLORIDE 20 MEQ/15ML (10%) PO SOLN: 10.0000 meq | Freq: Two times a day (BID) | ORAL | 5 refills | Status: DC

## 2017-12-23 NOTE — Progress Notes (Signed)
Reordered potassium

## 2017-12-24 ENCOUNTER — Other Ambulatory Visit: Payer: Self-pay | Admitting: Family Medicine

## 2017-12-24 MED ORDER — POTASSIUM CHLORIDE ER 10 MEQ PO TBCR: 10.0000 meq | EXTENDED_RELEASE_TABLET | Freq: Two times a day (BID) | ORAL | 5 refills | Status: DC

## 2017-12-29 ENCOUNTER — Other Ambulatory Visit: Payer: Self-pay

## 2017-12-29 ENCOUNTER — Telehealth: Payer: Self-pay | Admitting: *Deleted

## 2017-12-29 ENCOUNTER — Encounter: Payer: Self-pay | Admitting: Family Medicine

## 2017-12-29 DIAGNOSIS — R569 Unspecified convulsions: Secondary | ICD-10-CM

## 2017-12-29 MED ORDER — PHENOBARBITAL 32.4 MG PO TABS: ORAL_TABLET | ORAL | 0 refills | Status: DC

## 2017-12-29 NOTE — Progress Notes (Signed)
Phenobarbitol reordered

## 2017-12-29 NOTE — Telephone Encounter (Signed)
Sister called stating the pt is out of phenobarbital 32.4 mg tablet. She took her last one this morning and wanted a refill called in to [place].

## 2018-01-07 ENCOUNTER — Other Ambulatory Visit: Payer: Self-pay | Admitting: Family Medicine

## 2018-01-21 ENCOUNTER — Other Ambulatory Visit: Payer: Self-pay | Admitting: Family Medicine

## 2018-03-22 ENCOUNTER — Other Ambulatory Visit: Payer: Self-pay | Admitting: Family Medicine

## 2018-03-28 ENCOUNTER — Other Ambulatory Visit: Payer: Self-pay | Admitting: Family Medicine

## 2018-03-28 DIAGNOSIS — R569 Unspecified convulsions: Secondary | ICD-10-CM

## 2018-03-29 ENCOUNTER — Telehealth: Payer: Self-pay | Admitting: Family Medicine

## 2018-03-29 ENCOUNTER — Other Ambulatory Visit: Payer: Self-pay

## 2018-03-29 DIAGNOSIS — R569 Unspecified convulsions: Secondary | ICD-10-CM

## 2018-03-29 MED ORDER — PHENOBARBITAL 32.4 MG PO TABS: ORAL_TABLET | ORAL | 1 refills | Status: DC

## 2018-03-29 NOTE — Telephone Encounter (Signed)
Pt is out of her PHENobarbital (LUMINAL) 32.4 MG tablet. Please send into [facility]

## 2018-03-29 NOTE — Telephone Encounter (Signed)
Med refilled.

## 2018-04-04 ENCOUNTER — Encounter: Payer: Self-pay | Admitting: Family Medicine

## 2018-04-04 ENCOUNTER — Telehealth: Payer: Self-pay | Admitting: Family Medicine

## 2018-04-04 DIAGNOSIS — G8250 Quadriplegia, unspecified: Secondary | ICD-10-CM

## 2018-04-04 DIAGNOSIS — Z5189 Encounter for other specified aftercare: Secondary | ICD-10-CM

## 2018-04-04 NOTE — Telephone Encounter (Signed)
Pls refer to home health, pt is paraplegic with new ulcer on buttock, you may send photo that family member sent in a "my chart  Message" if able.  Needs twice weekly for wound care until healed ( 6 weeks)

## 2018-04-04 NOTE — Telephone Encounter (Signed)
[facility]erred to [facility] 

## 2018-04-04 NOTE — Addendum Note (Signed)
Addended by: Eual Fines on: 04/04/2018 03:32 PM   Modules accepted: Orders

## 2018-04-06 ENCOUNTER — Ambulatory Visit (INDEPENDENT_AMBULATORY_CARE_PROVIDER_SITE_OTHER): Payer: Medicare Other | Admitting: Family Medicine

## 2018-04-06 ENCOUNTER — Ambulatory Visit: Payer: Medicare Other | Admitting: Family Medicine

## 2018-04-06 ENCOUNTER — Encounter: Payer: Self-pay | Admitting: Family Medicine

## 2018-04-06 ENCOUNTER — Other Ambulatory Visit: Payer: Self-pay

## 2018-04-06 VITALS — BP 120/78 | HR 73 | Resp 14

## 2018-04-06 DIAGNOSIS — R569 Unspecified convulsions: Secondary | ICD-10-CM

## 2018-04-06 DIAGNOSIS — R05 Cough: Secondary | ICD-10-CM

## 2018-04-06 DIAGNOSIS — I10 Essential (primary) hypertension: Secondary | ICD-10-CM | POA: Diagnosis not present

## 2018-04-06 DIAGNOSIS — L89322 Pressure ulcer of left buttock, stage 2: Secondary | ICD-10-CM | POA: Diagnosis not present

## 2018-04-06 DIAGNOSIS — G40909 Epilepsy, unspecified, not intractable, without status epilepticus: Secondary | ICD-10-CM | POA: Diagnosis not present

## 2018-04-06 DIAGNOSIS — B369 Superficial mycosis, unspecified: Secondary | ICD-10-CM | POA: Insufficient documentation

## 2018-04-06 DIAGNOSIS — R058 Other specified cough: Secondary | ICD-10-CM

## 2018-04-06 DIAGNOSIS — G8250 Quadriplegia, unspecified: Secondary | ICD-10-CM | POA: Diagnosis not present

## 2018-04-06 DIAGNOSIS — L98421 Non-pressure chronic ulcer of back limited to breakdown of skin: Secondary | ICD-10-CM | POA: Diagnosis not present

## 2018-04-06 MED ORDER — MONTELUKAST SODIUM 10 MG PO TABS: 10.0000 mg | ORAL_TABLET | Freq: Every day | ORAL | 3 refills | Status: AC

## 2018-04-06 MED ORDER — DOXYCYCLINE HYCLATE 100 MG PO TABS: 100.0000 mg | ORAL_TABLET | Freq: Two times a day (BID) | ORAL | 0 refills | Status: DC

## 2018-04-06 MED ORDER — TERBINAFINE HCL 250 MG PO TABS: 250.0000 mg | ORAL_TABLET | Freq: Every day | ORAL | 0 refills | Status: AC

## 2018-04-06 NOTE — Assessment & Plan Note (Signed)
5 day course of doxycycline ordered and in home nursing twice weekly for 4 weeks for wound care until healed

## 2018-04-06 NOTE — Patient Instructions (Signed)
Please reschedule May follow up to end August , call if you need me sooner  You are treated with antibiotic and antifungal tablets for pressure sore on your buttock and fungal infection on skin  Please continue to keep skin dry, use pure Vaseline regularly , to help to protect the skin from infection  We will request twice weekly home health visit for 4 weeks / until ulcer is fully healed  Keep well!

## 2018-04-06 NOTE — Assessment & Plan Note (Signed)
Controlled, no change in medication  

## 2018-04-06 NOTE — Assessment & Plan Note (Signed)
Fungal infection of buttock, 2 week terbinafine prescribed

## 2018-04-06 NOTE — Assessment & Plan Note (Signed)
Controlled on current medication, no change needed

## 2018-04-06 NOTE — Progress Notes (Signed)
   April Watson     MRN: [id number]      DOB: 02/26/1960   HPI History from family members April Watson is here for evaluation of an ulcer on her left buttock which was first noted by family 1 week ago. Skin in the area is dry and irritated.sister states she tries her very bestto keep the area dry, however, mobilizing April Watson is a challenge. Notes open sore/ skin breakdown, diameter approximately 0.5 inch with bloody drainage.She denies fever or chills ROS Denies recent fever or chills. Denies  nasal congestion, t. Denies chest congestion, productive cough or wheezing. Denies leg swelling Denies , nausea, vomiting,diarrhea or constipation.   Denies malodorous urine , has chronic incontimnece Chronic  limitation in mobility.   PE  BP 120/78   Pulse 73   Resp 14   SpO2 100% Comment: room air  Patient alert and in no cardiopulmonary distress.  HEENT: No facial asymmetry, EOMI,   oropharynx pink and moist.  Neck decreased ROM JVD, no mass.  Chest: Clear to auscultation bilaterally.  CVS: S1, S2 no murmurs, .  ABD: Soft non tender.   Ext: No edema  MS: decreased  ROM spine, shoulders, hips and knees.  Skin: fungal infection of skin of buttock , with 0.5 inch diameter ulcer, no purulent drainage from ulcer  Psych: Good eye contact, normal affect. Memory intact not anxious or depressed appearing.  CNS: CN 2-12 intact, power,  normal throughout.no focal deficits noted.   Assessment & Plan  Dermatomycosis Fungal infection of buttock, 2 week terbinafine prescribed  Sacral ulcer, limited to breakdown of skin (HCC) 5 day course of doxycycline ordered and in home nursing twice weekly for 4 weeks for wound care until healed  Convulsions Controlled on current medication, no change needed  Essential hypertension Controlled, no change in medication   Allergic cough Controlled, no change in medication

## 2018-04-12 DIAGNOSIS — L89322 Pressure ulcer of left buttock, stage 2: Secondary | ICD-10-CM | POA: Diagnosis not present

## 2018-04-12 DIAGNOSIS — I10 Essential (primary) hypertension: Secondary | ICD-10-CM | POA: Diagnosis not present

## 2018-04-12 DIAGNOSIS — G40909 Epilepsy, unspecified, not intractable, without status epilepticus: Secondary | ICD-10-CM | POA: Diagnosis not present

## 2018-04-12 DIAGNOSIS — G8250 Quadriplegia, unspecified: Secondary | ICD-10-CM | POA: Diagnosis not present

## 2018-04-13 ENCOUNTER — Ambulatory Visit: Payer: Medicare Other | Admitting: Family Medicine

## 2018-04-13 DIAGNOSIS — G40909 Epilepsy, unspecified, not intractable, without status epilepticus: Secondary | ICD-10-CM | POA: Diagnosis not present

## 2018-04-13 DIAGNOSIS — G8250 Quadriplegia, unspecified: Secondary | ICD-10-CM | POA: Diagnosis not present

## 2018-04-13 DIAGNOSIS — L89322 Pressure ulcer of left buttock, stage 2: Secondary | ICD-10-CM | POA: Diagnosis not present

## 2018-04-13 DIAGNOSIS — I10 Essential (primary) hypertension: Secondary | ICD-10-CM | POA: Diagnosis not present

## 2018-04-15 DIAGNOSIS — I10 Essential (primary) hypertension: Secondary | ICD-10-CM | POA: Diagnosis not present

## 2018-04-15 DIAGNOSIS — L89322 Pressure ulcer of left buttock, stage 2: Secondary | ICD-10-CM | POA: Diagnosis not present

## 2018-04-15 DIAGNOSIS — G8250 Quadriplegia, unspecified: Secondary | ICD-10-CM | POA: Diagnosis not present

## 2018-04-15 DIAGNOSIS — G40909 Epilepsy, unspecified, not intractable, without status epilepticus: Secondary | ICD-10-CM | POA: Diagnosis not present

## 2018-04-18 ENCOUNTER — Telehealth: Payer: Self-pay | Admitting: *Deleted

## 2018-04-18 DIAGNOSIS — G40909 Epilepsy, unspecified, not intractable, without status epilepticus: Secondary | ICD-10-CM | POA: Diagnosis not present

## 2018-04-18 DIAGNOSIS — L89322 Pressure ulcer of left buttock, stage 2: Secondary | ICD-10-CM | POA: Diagnosis not present

## 2018-04-18 DIAGNOSIS — I10 Essential (primary) hypertension: Secondary | ICD-10-CM | POA: Diagnosis not present

## 2018-04-18 DIAGNOSIS — G8250 Quadriplegia, unspecified: Secondary | ICD-10-CM | POA: Diagnosis not present

## 2018-04-18 NOTE — Telephone Encounter (Signed)
Anderson Malta with [facility] called wanting to let Dr. Moshe Cipro know that April Watson had an open area on her left flank on her lower back. It was less than a cm wide and the sister had already started putting foam dressing over it. Wanted to know if this would be ok or if Dr. Moshe Cipro wanted to do something different.

## 2018-04-19 NOTE — Telephone Encounter (Signed)
Voice message left giving verbal permission to proceed with dressing as  reporrted to new area of concern

## 2018-04-21 ENCOUNTER — Ambulatory Visit: Payer: Medicare Other | Admitting: Family Medicine

## 2018-04-21 DIAGNOSIS — G8250 Quadriplegia, unspecified: Secondary | ICD-10-CM | POA: Diagnosis not present

## 2018-04-21 DIAGNOSIS — G40909 Epilepsy, unspecified, not intractable, without status epilepticus: Secondary | ICD-10-CM | POA: Diagnosis not present

## 2018-04-21 DIAGNOSIS — I10 Essential (primary) hypertension: Secondary | ICD-10-CM | POA: Diagnosis not present

## 2018-04-21 DIAGNOSIS — L89322 Pressure ulcer of left buttock, stage 2: Secondary | ICD-10-CM | POA: Diagnosis not present

## 2018-04-26 DIAGNOSIS — G40909 Epilepsy, unspecified, not intractable, without status epilepticus: Secondary | ICD-10-CM | POA: Diagnosis not present

## 2018-04-26 DIAGNOSIS — G8250 Quadriplegia, unspecified: Secondary | ICD-10-CM | POA: Diagnosis not present

## 2018-04-26 DIAGNOSIS — I10 Essential (primary) hypertension: Secondary | ICD-10-CM | POA: Diagnosis not present

## 2018-04-26 DIAGNOSIS — L89322 Pressure ulcer of left buttock, stage 2: Secondary | ICD-10-CM | POA: Diagnosis not present

## 2018-04-27 ENCOUNTER — Telehealth: Payer: Self-pay | Admitting: *Deleted

## 2018-04-27 ENCOUNTER — Other Ambulatory Visit: Payer: Self-pay

## 2018-04-27 MED ORDER — VITAMIN D (ERGOCALCIFEROL) 1.25 MG (50000 UNIT) PO CAPS: ORAL_CAPSULE | ORAL | 5 refills | Status: DC

## 2018-04-27 NOTE — Telephone Encounter (Signed)
Pt needs a refill on Vitamin D sent to [facility]

## 2018-04-27 NOTE — Telephone Encounter (Signed)
Refill sent.

## 2018-04-28 ENCOUNTER — Other Ambulatory Visit: Payer: Self-pay

## 2018-04-28 ENCOUNTER — Telehealth: Payer: Self-pay | Admitting: *Deleted

## 2018-04-28 ENCOUNTER — Other Ambulatory Visit: Payer: Self-pay | Admitting: Family Medicine

## 2018-04-28 MED ORDER — SIMVASTATIN 10 MG PO TABS: ORAL_TABLET | ORAL | 11 refills | Status: DC

## 2018-04-28 NOTE — Telephone Encounter (Signed)
[name] Pts sister (on [place]) called pt needs a refill on her simvastatin (zocor) called in to [place]

## 2018-04-28 NOTE — Telephone Encounter (Signed)
Refill sent.

## 2018-05-02 DIAGNOSIS — L89322 Pressure ulcer of left buttock, stage 2: Secondary | ICD-10-CM | POA: Diagnosis not present

## 2018-05-02 DIAGNOSIS — I10 Essential (primary) hypertension: Secondary | ICD-10-CM | POA: Diagnosis not present

## 2018-05-02 DIAGNOSIS — G40909 Epilepsy, unspecified, not intractable, without status epilepticus: Secondary | ICD-10-CM | POA: Diagnosis not present

## 2018-05-02 DIAGNOSIS — G8250 Quadriplegia, unspecified: Secondary | ICD-10-CM | POA: Diagnosis not present

## 2018-05-21 ENCOUNTER — Encounter: Payer: Self-pay | Admitting: Family Medicine

## 2018-05-24 ENCOUNTER — Other Ambulatory Visit: Payer: Self-pay

## 2018-05-24 ENCOUNTER — Telehealth: Payer: Self-pay

## 2018-05-24 ENCOUNTER — Encounter: Payer: Self-pay | Admitting: Family Medicine

## 2018-05-24 DIAGNOSIS — L98421 Non-pressure chronic ulcer of back limited to breakdown of skin: Secondary | ICD-10-CM

## 2018-05-24 MED ORDER — PANTOPRAZOLE SODIUM 20 MG PO TBEC: 20.0000 mg | DELAYED_RELEASE_TABLET | Freq: Every day | ORAL | 5 refills | Status: DC

## 2018-05-24 NOTE — Telephone Encounter (Signed)
Received a message that the pressure sore on April Watson had opened back up again and the home health nurse said a new order would have to be placed for the wound nurse to come back out to assess it. Do you want me to enter order for this or does she need a new visit? Please advise

## 2018-05-24 NOTE — Telephone Encounter (Signed)
Order entered- home health said visit was up to provider

## 2018-05-24 NOTE — Telephone Encounter (Signed)
Put in order,  pls if she needs a new visit they will let u know/ you can check with home health to see if she does need the visit

## 2018-05-25 DIAGNOSIS — B369 Superficial mycosis, unspecified: Secondary | ICD-10-CM | POA: Diagnosis not present

## 2018-05-25 DIAGNOSIS — G40909 Epilepsy, unspecified, not intractable, without status epilepticus: Secondary | ICD-10-CM | POA: Diagnosis not present

## 2018-05-25 DIAGNOSIS — L89322 Pressure ulcer of left buttock, stage 2: Secondary | ICD-10-CM | POA: Diagnosis not present

## 2018-05-25 DIAGNOSIS — L89892 Pressure ulcer of other site, stage 2: Secondary | ICD-10-CM | POA: Diagnosis not present

## 2018-05-25 DIAGNOSIS — I10 Essential (primary) hypertension: Secondary | ICD-10-CM | POA: Diagnosis not present

## 2018-05-25 DIAGNOSIS — G8250 Quadriplegia, unspecified: Secondary | ICD-10-CM | POA: Diagnosis not present

## 2018-05-30 DIAGNOSIS — L89892 Pressure ulcer of other site, stage 2: Secondary | ICD-10-CM | POA: Diagnosis not present

## 2018-05-30 DIAGNOSIS — G40909 Epilepsy, unspecified, not intractable, without status epilepticus: Secondary | ICD-10-CM

## 2018-05-30 DIAGNOSIS — G8250 Quadriplegia, unspecified: Secondary | ICD-10-CM | POA: Diagnosis not present

## 2018-05-30 DIAGNOSIS — I10 Essential (primary) hypertension: Secondary | ICD-10-CM | POA: Diagnosis not present

## 2018-05-30 DIAGNOSIS — B369 Superficial mycosis, unspecified: Secondary | ICD-10-CM | POA: Diagnosis not present

## 2018-05-30 DIAGNOSIS — L89322 Pressure ulcer of left buttock, stage 2: Secondary | ICD-10-CM | POA: Diagnosis not present

## 2018-05-31 DIAGNOSIS — G40909 Epilepsy, unspecified, not intractable, without status epilepticus: Secondary | ICD-10-CM | POA: Diagnosis not present

## 2018-05-31 DIAGNOSIS — I10 Essential (primary) hypertension: Secondary | ICD-10-CM | POA: Diagnosis not present

## 2018-05-31 DIAGNOSIS — B369 Superficial mycosis, unspecified: Secondary | ICD-10-CM | POA: Diagnosis not present

## 2018-05-31 DIAGNOSIS — G8250 Quadriplegia, unspecified: Secondary | ICD-10-CM | POA: Diagnosis not present

## 2018-05-31 DIAGNOSIS — L89892 Pressure ulcer of other site, stage 2: Secondary | ICD-10-CM | POA: Diagnosis not present

## 2018-05-31 DIAGNOSIS — L89322 Pressure ulcer of left buttock, stage 2: Secondary | ICD-10-CM | POA: Diagnosis not present

## 2018-06-02 ENCOUNTER — Ambulatory Visit: Payer: Medicare Other | Admitting: Family Medicine

## 2018-06-03 DIAGNOSIS — L89322 Pressure ulcer of left buttock, stage 2: Secondary | ICD-10-CM | POA: Diagnosis not present

## 2018-06-03 DIAGNOSIS — G40909 Epilepsy, unspecified, not intractable, without status epilepticus: Secondary | ICD-10-CM | POA: Diagnosis not present

## 2018-06-03 DIAGNOSIS — G8250 Quadriplegia, unspecified: Secondary | ICD-10-CM | POA: Diagnosis not present

## 2018-06-03 DIAGNOSIS — L89892 Pressure ulcer of other site, stage 2: Secondary | ICD-10-CM | POA: Diagnosis not present

## 2018-06-03 DIAGNOSIS — B369 Superficial mycosis, unspecified: Secondary | ICD-10-CM | POA: Diagnosis not present

## 2018-06-03 DIAGNOSIS — I10 Essential (primary) hypertension: Secondary | ICD-10-CM | POA: Diagnosis not present

## 2018-06-06 ENCOUNTER — Ambulatory Visit: Payer: Medicare Other | Admitting: Family Medicine

## 2018-06-07 DIAGNOSIS — G40909 Epilepsy, unspecified, not intractable, without status epilepticus: Secondary | ICD-10-CM | POA: Diagnosis not present

## 2018-06-07 DIAGNOSIS — G8250 Quadriplegia, unspecified: Secondary | ICD-10-CM | POA: Diagnosis not present

## 2018-06-07 DIAGNOSIS — L89322 Pressure ulcer of left buttock, stage 2: Secondary | ICD-10-CM | POA: Diagnosis not present

## 2018-06-07 DIAGNOSIS — B369 Superficial mycosis, unspecified: Secondary | ICD-10-CM | POA: Diagnosis not present

## 2018-06-07 DIAGNOSIS — L89892 Pressure ulcer of other site, stage 2: Secondary | ICD-10-CM | POA: Diagnosis not present

## 2018-06-07 DIAGNOSIS — I10 Essential (primary) hypertension: Secondary | ICD-10-CM | POA: Diagnosis not present

## 2018-06-10 DIAGNOSIS — L89322 Pressure ulcer of left buttock, stage 2: Secondary | ICD-10-CM | POA: Diagnosis not present

## 2018-06-10 DIAGNOSIS — B369 Superficial mycosis, unspecified: Secondary | ICD-10-CM | POA: Diagnosis not present

## 2018-06-10 DIAGNOSIS — I10 Essential (primary) hypertension: Secondary | ICD-10-CM | POA: Diagnosis not present

## 2018-06-10 DIAGNOSIS — L89892 Pressure ulcer of other site, stage 2: Secondary | ICD-10-CM | POA: Diagnosis not present

## 2018-06-10 DIAGNOSIS — G40909 Epilepsy, unspecified, not intractable, without status epilepticus: Secondary | ICD-10-CM | POA: Diagnosis not present

## 2018-06-10 DIAGNOSIS — G8250 Quadriplegia, unspecified: Secondary | ICD-10-CM | POA: Diagnosis not present

## 2018-06-15 DIAGNOSIS — B369 Superficial mycosis, unspecified: Secondary | ICD-10-CM | POA: Diagnosis not present

## 2018-06-15 DIAGNOSIS — L89892 Pressure ulcer of other site, stage 2: Secondary | ICD-10-CM | POA: Diagnosis not present

## 2018-06-15 DIAGNOSIS — G8250 Quadriplegia, unspecified: Secondary | ICD-10-CM | POA: Diagnosis not present

## 2018-06-15 DIAGNOSIS — G40909 Epilepsy, unspecified, not intractable, without status epilepticus: Secondary | ICD-10-CM | POA: Diagnosis not present

## 2018-06-15 DIAGNOSIS — I10 Essential (primary) hypertension: Secondary | ICD-10-CM | POA: Diagnosis not present

## 2018-06-15 DIAGNOSIS — L89322 Pressure ulcer of left buttock, stage 2: Secondary | ICD-10-CM | POA: Diagnosis not present

## 2018-06-16 ENCOUNTER — Other Ambulatory Visit: Payer: Self-pay | Admitting: Family Medicine

## 2018-06-23 DIAGNOSIS — G40909 Epilepsy, unspecified, not intractable, without status epilepticus: Secondary | ICD-10-CM | POA: Diagnosis not present

## 2018-06-23 DIAGNOSIS — B369 Superficial mycosis, unspecified: Secondary | ICD-10-CM | POA: Diagnosis not present

## 2018-06-23 DIAGNOSIS — I10 Essential (primary) hypertension: Secondary | ICD-10-CM | POA: Diagnosis not present

## 2018-06-23 DIAGNOSIS — G8250 Quadriplegia, unspecified: Secondary | ICD-10-CM | POA: Diagnosis not present

## 2018-06-23 DIAGNOSIS — L89322 Pressure ulcer of left buttock, stage 2: Secondary | ICD-10-CM | POA: Diagnosis not present

## 2018-06-23 DIAGNOSIS — L89892 Pressure ulcer of other site, stage 2: Secondary | ICD-10-CM | POA: Diagnosis not present

## 2018-06-24 DIAGNOSIS — B369 Superficial mycosis, unspecified: Secondary | ICD-10-CM | POA: Diagnosis not present

## 2018-06-24 DIAGNOSIS — L89322 Pressure ulcer of left buttock, stage 2: Secondary | ICD-10-CM | POA: Diagnosis not present

## 2018-06-24 DIAGNOSIS — G40909 Epilepsy, unspecified, not intractable, without status epilepticus: Secondary | ICD-10-CM | POA: Diagnosis not present

## 2018-06-24 DIAGNOSIS — I10 Essential (primary) hypertension: Secondary | ICD-10-CM | POA: Diagnosis not present

## 2018-06-24 DIAGNOSIS — G8250 Quadriplegia, unspecified: Secondary | ICD-10-CM | POA: Diagnosis not present

## 2018-06-24 DIAGNOSIS — L89892 Pressure ulcer of other site, stage 2: Secondary | ICD-10-CM | POA: Diagnosis not present

## 2018-06-28 DIAGNOSIS — G40909 Epilepsy, unspecified, not intractable, without status epilepticus: Secondary | ICD-10-CM | POA: Diagnosis not present

## 2018-06-28 DIAGNOSIS — G8250 Quadriplegia, unspecified: Secondary | ICD-10-CM | POA: Diagnosis not present

## 2018-06-28 DIAGNOSIS — L89892 Pressure ulcer of other site, stage 2: Secondary | ICD-10-CM | POA: Diagnosis not present

## 2018-06-28 DIAGNOSIS — L89322 Pressure ulcer of left buttock, stage 2: Secondary | ICD-10-CM | POA: Diagnosis not present

## 2018-06-28 DIAGNOSIS — B369 Superficial mycosis, unspecified: Secondary | ICD-10-CM | POA: Diagnosis not present

## 2018-06-28 DIAGNOSIS — I10 Essential (primary) hypertension: Secondary | ICD-10-CM | POA: Diagnosis not present

## 2018-07-04 DIAGNOSIS — L89892 Pressure ulcer of other site, stage 2: Secondary | ICD-10-CM | POA: Diagnosis not present

## 2018-07-04 DIAGNOSIS — I10 Essential (primary) hypertension: Secondary | ICD-10-CM | POA: Diagnosis not present

## 2018-07-04 DIAGNOSIS — G8250 Quadriplegia, unspecified: Secondary | ICD-10-CM | POA: Diagnosis not present

## 2018-07-04 DIAGNOSIS — L89322 Pressure ulcer of left buttock, stage 2: Secondary | ICD-10-CM | POA: Diagnosis not present

## 2018-07-04 DIAGNOSIS — B369 Superficial mycosis, unspecified: Secondary | ICD-10-CM | POA: Diagnosis not present

## 2018-07-04 DIAGNOSIS — G40909 Epilepsy, unspecified, not intractable, without status epilepticus: Secondary | ICD-10-CM | POA: Diagnosis not present

## 2018-07-12 ENCOUNTER — Other Ambulatory Visit: Payer: Self-pay | Admitting: Family Medicine

## 2018-07-12 ENCOUNTER — Telehealth: Payer: Self-pay | Admitting: Family Medicine

## 2018-07-12 MED ORDER — DOXYCYCLINE HYCLATE 100 MG PO TABS: 100.0000 mg | ORAL_TABLET | Freq: Two times a day (BID) | ORAL | 0 refills | Status: DC

## 2018-07-12 NOTE — Telephone Encounter (Signed)
Already spoke to Bonita at [facility] and she is aware of the plan and the pt has been referred per her to wound care

## 2018-07-12 NOTE — Telephone Encounter (Signed)
April Watson called regarding [facility] orders wanted to know what the treatment plan was

## 2018-07-12 NOTE — Telephone Encounter (Signed)
They are seeing her for a wound on her back --increase in drainage, foul smell, pink skin surrounding the wound.   Can you send in antibotics, and Benjamine Mola is doing a referral to the wound center

## 2018-07-12 NOTE — Telephone Encounter (Signed)
Doxycycline has been sent in , wound center eval asap please!

## 2018-07-14 DIAGNOSIS — G40909 Epilepsy, unspecified, not intractable, without status epilepticus: Secondary | ICD-10-CM | POA: Diagnosis not present

## 2018-07-14 DIAGNOSIS — G8250 Quadriplegia, unspecified: Secondary | ICD-10-CM | POA: Diagnosis not present

## 2018-07-14 DIAGNOSIS — I10 Essential (primary) hypertension: Secondary | ICD-10-CM | POA: Diagnosis not present

## 2018-07-14 DIAGNOSIS — L89322 Pressure ulcer of left buttock, stage 2: Secondary | ICD-10-CM | POA: Diagnosis not present

## 2018-07-14 DIAGNOSIS — B369 Superficial mycosis, unspecified: Secondary | ICD-10-CM | POA: Diagnosis not present

## 2018-07-14 DIAGNOSIS — L89892 Pressure ulcer of other site, stage 2: Secondary | ICD-10-CM | POA: Diagnosis not present

## 2018-07-17 ENCOUNTER — Encounter: Payer: Self-pay | Admitting: Family Medicine

## 2018-07-18 ENCOUNTER — Encounter: Payer: Self-pay | Admitting: Family Medicine

## 2018-07-20 ENCOUNTER — Encounter: Payer: Self-pay | Admitting: Family Medicine

## 2018-07-20 ENCOUNTER — Inpatient Hospital Stay (HOSPITAL_COMMUNITY)
Admission: EM | Admit: 2018-07-20 | Discharge: 2018-07-27 | DRG: 853 | Disposition: A | Discharge status: Home Health | Payer: Medicare Other | Attending: Internal Medicine | Admitting: Internal Medicine

## 2018-07-20 ENCOUNTER — Telehealth: Payer: Self-pay

## 2018-07-20 ENCOUNTER — Telehealth (INDEPENDENT_AMBULATORY_CARE_PROVIDER_SITE_OTHER): Payer: Medicare Other | Admitting: Family Medicine

## 2018-07-20 ENCOUNTER — Emergency Department (HOSPITAL_COMMUNITY): Payer: Medicare Other

## 2018-07-20 ENCOUNTER — Emergency Department: Payer: Self-pay

## 2018-07-20 ENCOUNTER — Other Ambulatory Visit: Payer: Self-pay

## 2018-07-20 ENCOUNTER — Encounter (HOSPITAL_COMMUNITY): Payer: Self-pay | Admitting: Emergency Medicine

## 2018-07-20 DIAGNOSIS — L02211 Cutaneous abscess of abdominal wall: Secondary | ICD-10-CM | POA: Diagnosis present

## 2018-07-20 DIAGNOSIS — L0291 Cutaneous abscess, unspecified: Secondary | ICD-10-CM | POA: Diagnosis not present

## 2018-07-20 DIAGNOSIS — G40909 Epilepsy, unspecified, not intractable, without status epilepticus: Secondary | ICD-10-CM | POA: Diagnosis present

## 2018-07-20 DIAGNOSIS — K567 Ileus, unspecified: Secondary | ICD-10-CM | POA: Diagnosis present

## 2018-07-20 DIAGNOSIS — M419 Scoliosis, unspecified: Secondary | ICD-10-CM | POA: Diagnosis present

## 2018-07-20 DIAGNOSIS — K219 Gastro-esophageal reflux disease without esophagitis: Secondary | ICD-10-CM | POA: Diagnosis present

## 2018-07-20 DIAGNOSIS — K5909 Other constipation: Secondary | ICD-10-CM | POA: Diagnosis present

## 2018-07-20 DIAGNOSIS — G8250 Quadriplegia, unspecified: Secondary | ICD-10-CM | POA: Diagnosis present

## 2018-07-20 DIAGNOSIS — E1149 Type 2 diabetes mellitus with other diabetic neurological complication: Secondary | ICD-10-CM

## 2018-07-20 DIAGNOSIS — L98421 Non-pressure chronic ulcer of back limited to breakdown of skin: Secondary | ICD-10-CM

## 2018-07-20 DIAGNOSIS — R531 Weakness: Secondary | ICD-10-CM | POA: Diagnosis not present

## 2018-07-20 DIAGNOSIS — L89322 Pressure ulcer of left buttock, stage 2: Secondary | ICD-10-CM | POA: Diagnosis present

## 2018-07-20 DIAGNOSIS — E876 Hypokalemia: Secondary | ICD-10-CM | POA: Diagnosis present

## 2018-07-20 DIAGNOSIS — Z800 Family history of malignant neoplasm of digestive organs: Secondary | ICD-10-CM

## 2018-07-20 DIAGNOSIS — R140 Abdominal distension (gaseous): Secondary | ICD-10-CM

## 2018-07-20 DIAGNOSIS — Z841 Family history of disorders of kidney and ureter: Secondary | ICD-10-CM | POA: Diagnosis not present

## 2018-07-20 DIAGNOSIS — Z83511 Family history of glaucoma: Secondary | ICD-10-CM

## 2018-07-20 DIAGNOSIS — K8020 Calculus of gallbladder without cholecystitis without obstruction: Secondary | ICD-10-CM | POA: Diagnosis not present

## 2018-07-20 DIAGNOSIS — Z8349 Family history of other endocrine, nutritional and metabolic diseases: Secondary | ICD-10-CM

## 2018-07-20 DIAGNOSIS — Q039 Congenital hydrocephalus, unspecified: Secondary | ICD-10-CM

## 2018-07-20 DIAGNOSIS — Z209 Contact with and (suspected) exposure to unspecified communicable disease: Secondary | ICD-10-CM | POA: Diagnosis not present

## 2018-07-20 DIAGNOSIS — L89103 Pressure ulcer of unspecified part of back, stage 3: Secondary | ICD-10-CM

## 2018-07-20 DIAGNOSIS — L0390 Cellulitis, unspecified: Secondary | ICD-10-CM

## 2018-07-20 DIAGNOSIS — Z8249 Family history of ischemic heart disease and other diseases of the circulatory system: Secondary | ICD-10-CM

## 2018-07-20 DIAGNOSIS — A419 Sepsis, unspecified organism: Secondary | ICD-10-CM

## 2018-07-20 DIAGNOSIS — Z7401 Bed confinement status: Secondary | ICD-10-CM | POA: Diagnosis not present

## 2018-07-20 DIAGNOSIS — E785 Hyperlipidemia, unspecified: Secondary | ICD-10-CM | POA: Diagnosis present

## 2018-07-20 DIAGNOSIS — Z1159 Encounter for screening for other viral diseases: Secondary | ICD-10-CM

## 2018-07-20 DIAGNOSIS — Z03818 Encounter for observation for suspected exposure to other biological agents ruled out: Secondary | ICD-10-CM | POA: Diagnosis not present

## 2018-07-20 DIAGNOSIS — A4159 Other Gram-negative sepsis: Principal | ICD-10-CM | POA: Diagnosis present

## 2018-07-20 DIAGNOSIS — I10 Essential (primary) hypertension: Secondary | ICD-10-CM | POA: Diagnosis present

## 2018-07-20 DIAGNOSIS — Z8719 Personal history of other diseases of the digestive system: Secondary | ICD-10-CM

## 2018-07-20 DIAGNOSIS — R059 Cough, unspecified: Secondary | ICD-10-CM

## 2018-07-20 DIAGNOSIS — R05 Cough: Secondary | ICD-10-CM

## 2018-07-20 DIAGNOSIS — Z79899 Other long term (current) drug therapy: Secondary | ICD-10-CM

## 2018-07-20 DIAGNOSIS — Z8042 Family history of malignant neoplasm of prostate: Secondary | ICD-10-CM | POA: Diagnosis not present

## 2018-07-20 DIAGNOSIS — R609 Edema, unspecified: Secondary | ICD-10-CM | POA: Diagnosis not present

## 2018-07-20 DIAGNOSIS — L89152 Pressure ulcer of sacral region, stage 2: Secondary | ICD-10-CM | POA: Diagnosis present

## 2018-07-20 DIAGNOSIS — F79 Unspecified intellectual disabilities: Secondary | ICD-10-CM | POA: Diagnosis present

## 2018-07-20 DIAGNOSIS — L89151 Pressure ulcer of sacral region, stage 1: Secondary | ICD-10-CM

## 2018-07-20 DIAGNOSIS — R404 Transient alteration of awareness: Secondary | ICD-10-CM | POA: Diagnosis not present

## 2018-07-20 DIAGNOSIS — R1900 Intra-abdominal and pelvic swelling, mass and lump, unspecified site: Secondary | ICD-10-CM | POA: Diagnosis not present

## 2018-07-20 DIAGNOSIS — Z807 Family history of other malignant neoplasms of lymphoid, hematopoietic and related tissues: Secondary | ICD-10-CM

## 2018-07-20 DIAGNOSIS — Z792 Long term (current) use of antibiotics: Secondary | ICD-10-CM

## 2018-07-20 LAB — SARS CORONAVIRUS 2 BY RT PCR (HOSPITAL ORDER, PERFORMED IN ~~LOC~~ HOSPITAL LAB): SARS Coronavirus 2: NEGATIVE

## 2018-07-20 LAB — COMPREHENSIVE METABOLIC PANEL
ALT: 16 U/L (ref 0–44)
AST: 18 U/L (ref 15–41)
Albumin: 2.2 g/dL — ABNORMAL LOW (ref 3.5–5.0)
Alkaline Phosphatase: 74 U/L (ref 38–126)
Anion gap: 15 (ref 5–15)
BUN: 29 mg/dL — ABNORMAL HIGH (ref 6–20)
CO2: 28 mmol/L (ref 22–32)
Calcium: 8.6 mg/dL — ABNORMAL LOW (ref 8.9–10.3)
Chloride: 95 mmol/L — ABNORMAL LOW (ref 98–111)
Creatinine, Ser: 0.62 mg/dL (ref 0.44–1.00)
GFR calc Af Amer: >60 mL/min (ref >60)
GFR calc non Af Amer: >60 mL/min (ref >60)
Glucose, Bld: 147 mg/dL — ABNORMAL HIGH (ref 70–99)
Potassium: 2.5 mmol/L — CL (ref 3.5–5.1)
Sodium: 138 mmol/L (ref 135–145)
Total Bilirubin: 0.8 mg/dL (ref 0.3–1.2)
Total Protein: 6 g/dL — ABNORMAL LOW (ref 6.5–8.1)

## 2018-07-20 LAB — CBC WITH DIFFERENTIAL/PLATELET
Abs Immature Granulocytes: 0.24 10*3/uL — ABNORMAL HIGH (ref 0.00–0.07)
Basophils Absolute: 0.1 10*3/uL (ref 0.0–0.1)
Basophils Relative: 0 %
Eosinophils Absolute: 0.1 10*3/uL (ref 0.0–0.5)
Eosinophils Relative: 0 %
HCT: 35.6 % — ABNORMAL LOW (ref 36.0–46.0)
Hemoglobin: 11.2 g/dL — ABNORMAL LOW (ref 12.0–15.0)
Immature Granulocytes: 1 %
Lymphocytes Relative: 12 %
Lymphs Abs: 2.3 10*3/uL (ref 0.7–4.0)
MCH: 28.4 pg (ref 26.0–34.0)
MCHC: 31.5 g/dL (ref 30.0–36.0)
MCV: 90.4 fL (ref 80.0–100.0)
Monocytes Absolute: 1.9 10*3/uL — ABNORMAL HIGH (ref 0.1–1.0)
Monocytes Relative: 10 %
Neutro Abs: 14.6 10*3/uL — ABNORMAL HIGH (ref 1.7–7.7)
Neutrophils Relative %: 77 %
Platelets: 132 10*3/uL — ABNORMAL LOW (ref 150–400)
RBC: 3.94 MIL/uL (ref 3.87–5.11)
RDW: 14.2 % (ref 11.5–15.5)
WBC: 19.2 10*3/uL — ABNORMAL HIGH (ref 4.0–10.5)
nRBC: 0 % (ref 0.0–0.2)

## 2018-07-20 LAB — LACTIC ACID, PLASMA: Lactic Acid, Venous: 1.4 mmol/L (ref 0.5–1.9)

## 2018-07-20 LAB — MAGNESIUM: Magnesium: 1.8 mg/dL (ref 1.7–2.4)

## 2018-07-20 LAB — PHOSPHORUS: Phosphorus: 2.9 mg/dL (ref 2.5–4.6)

## 2018-07-20 MED ORDER — VANCOMYCIN HCL IN DEXTROSE 1-5 GM/200ML-% IV SOLN
1000.0000 mg | INTRAVENOUS | Status: AC
  Administered 2018-07-20 – 2018-07-21 (×2): 1000 mg via INTRAVENOUS

## 2018-07-20 MED ORDER — ACETAMINOPHEN 650 MG RE SUPP: 650.0000 mg | Freq: Four times a day (QID) | RECTAL | Status: DC | PRN

## 2018-07-20 MED ORDER — MAGNESIUM SULFATE 2 GM/50ML IV SOLN
2.0000 g | Freq: Once | INTRAVENOUS | Status: AC
  Administered 2018-07-21: 2 g via INTRAVENOUS
  Filled 2018-07-20: qty 50

## 2018-07-20 MED ORDER — POTASSIUM CHLORIDE 10 MEQ/100ML IV SOLN
10.0000 meq | Freq: Once | INTRAVENOUS | Status: AC
  Administered 2018-07-20: 22:00:00 10 meq via INTRAVENOUS
  Filled 2018-07-20: qty 100

## 2018-07-20 MED ORDER — SODIUM CHLORIDE 0.9 % IV BOLUS
500.0000 mL | Freq: Once | INTRAVENOUS | Status: AC
  Administered 2018-07-20: 500 mL via INTRAVENOUS

## 2018-07-20 MED ORDER — PROCHLORPERAZINE EDISYLATE 10 MG/2ML IJ SOLN: 5.0000 mg | INTRAMUSCULAR | Status: DC | PRN

## 2018-07-20 MED ORDER — PIPERACILLIN-TAZOBACTAM 3.375 G IVPB
3.3750 g | Freq: Three times a day (TID) | INTRAVENOUS | Status: DC
  Administered 2018-07-21 – 2018-07-24 (×10): 3.375 g via INTRAVENOUS
  Filled 2018-07-20 (×10): qty 50

## 2018-07-20 MED ORDER — POTASSIUM CHLORIDE IN NACL 40-0.9 MEQ/L-% IV SOLN
INTRAVENOUS | Status: DC
  Administered 2018-07-20: 100 mL/h via INTRAVENOUS
  Filled 2018-07-20 (×2): qty 1000

## 2018-07-20 MED ORDER — POTASSIUM CHLORIDE ER 10 MEQ PO TBCR: EXTENDED_RELEASE_TABLET | ORAL | 11 refills | Status: DC

## 2018-07-20 MED ORDER — PIPERACILLIN-TAZOBACTAM 3.375 G IVPB 30 MIN
3.3750 g | Freq: Once | INTRAVENOUS | Status: AC
  Administered 2018-07-20: 3.375 g via INTRAVENOUS
  Filled 2018-07-20: qty 50

## 2018-07-20 MED ORDER — ACETAMINOPHEN 325 MG PO TABS
650.0000 mg | ORAL_TABLET | Freq: Four times a day (QID) | ORAL | Status: DC | PRN
  Administered 2018-07-21 – 2018-07-27 (×8): 650 mg via ORAL
  Filled 2018-07-20 (×7): qty 2

## 2018-07-20 MED ORDER — ENOXAPARIN SODIUM 40 MG/0.4ML ~~LOC~~ SOLN
40.0000 mg | SUBCUTANEOUS | Status: DC
  Administered 2018-07-20 – 2018-07-26 (×7): 40 mg via SUBCUTANEOUS
  Filled 2018-07-20 (×7): qty 0.4

## 2018-07-20 MED ORDER — SODIUM CHLORIDE 0.9% FLUSH
10.0000 mL | Freq: Two times a day (BID) | INTRAVENOUS | Status: DC
  Administered 2018-07-21: 10 mL
  Administered 2018-07-22: 20 mL
  Administered 2018-07-23: 40 mL
  Administered 2018-07-23 – 2018-07-26 (×7): 10 mL

## 2018-07-20 MED ORDER — POTASSIUM CHLORIDE 10 MEQ/100ML IV SOLN
10.0000 meq | Freq: Once | INTRAVENOUS | Status: AC
  Administered 2018-07-20: 10 meq via INTRAVENOUS
  Filled 2018-07-20: qty 100

## 2018-07-20 MED ORDER — SODIUM CHLORIDE 0.9% FLUSH: 10.0000 mL | INTRAVENOUS | Status: DC | PRN

## 2018-07-20 MED ORDER — SODIUM CHLORIDE 0.9 % IV BOLUS
1000.0000 mL | Freq: Once | INTRAVENOUS | Status: AC
  Administered 2018-07-20: 1000 mL via INTRAVENOUS

## 2018-07-20 MED ORDER — VANCOMYCIN HCL IN DEXTROSE 1-5 GM/200ML-% IV SOLN
1000.0000 mg | Freq: Once | INTRAVENOUS | Status: DC
  Filled 2018-07-20: qty 200

## 2018-07-20 NOTE — Progress Notes (Signed)
Pharmacy Antibiotic Note  April Watson is a 58 y.o. female admitted on 07/20/2018 with cellulitis.  Pharmacy has been consulted for zosyn and vancomycin dosing.  Plan: Vancomycin 2gm iv x 1, following pharmacist to order additional doses once crcl determined  Zosyn 3.375g IV q8h (4 hour infusion).  Weight: 160 lb (72.6 kg)  Temp (24hrs), Avg:99.3 F (37.4 C), Min:99.3 F (37.4 C), Max:99.3 F (37.4 C)  Recent Labs  Lab 07/20/18 1945  WBC 19.2*  CREATININE 0.62  LATICACIDVEN 1.4    CrCl cannot be calculated (Unknown ideal weight.).    No Known Allergies  Antimicrobials this admission: 7/1 vancomycin >>  7/1 zosyn >>    Microbiology results: 7/1 BCx: sent 7/1 Wound Cx: sent  7/1 Covid 19 negative     Thank you for allowing pharmacy to be a part of this patient's care.  April Watson April Watson 07/20/2018 10:19 PM

## 2018-07-20 NOTE — ED Notes (Signed)
Date and time results received: 07/20/18 8:35 PM (use smartphrase ".now" to insert current time)  Test: K+ Critical Value: 2.5  Name of Provider Notified: Dr Roderic Palau  Orders Received? Or Actions Taken?: no new orders at this time.

## 2018-07-20 NOTE — H&P (Signed)
History and Physical    April Watson [id number] DOB: 1960/01/26 DOA: 07/20/2018  PCP: Fayrene Helper, MD  Patient coming from: Sent by Dr. Berton Lan for abdominal distention.  I have personally briefly reviewed patient's old medical records in [place]  Chief Complaint: Abdominal distention.  HPI: April Watson is a 58 y.o. female with medical history significant of history of high-grade rectum dysplasia, chronic constipation, congenital hydrocephalus, MR, GERD, hyperlipidemia, hypertension, seizure disorder who was sent by Dr. Moshe Cipro for evaluation of abdominal distention for several days and progressively worse left flank/lower back wound-started very small back in March, but now has become a lot more extensive and has been draining malodorous fluid since about a week.  No fever, chills, nausea or vomiting per sister.  She has chronic constipation, but has been having a small bowel movements.  She has been following with wound care.  ED Course: Initial vital signs temperature 99.3 F, pulse 82, respiration 19, blood pressure 115/63 mmHg and O2 sat 100% on room air.  The patient received 1500 mL of NS bolus, vancomycin and Zosyn in the emergency department.  CBC shows a white count of 19.2 with 77% neutrophils, 12% lymphocytes.  Hemoglobin was 11.2 g/dL and platelets 132.  Lactic acid was 1.4.  Potassium is 2.5, chloride 95 mmol/L.  All other electrolytes are within normal limits when calcium is corrected to albumin.  Glucose 147, BUN 29, creatinine 0.62 and calcium 8.6 mg/dL.  Total protein was 6.0 and albumin 2.2 g/dL, the rest of the hepatic functions are within normal limits.  Imaging: CT abdomen/pelvis without contrast shows someone distorted anatomy due to patient's a scoliosis.  There was massive distention of the sigmoid colon, which is contiguous with fluid-filled rectum.  There is no colonic narrowing or evidence of volvulus.  Likely functional obstruction at the  rectosigmoid colon level.  Negative for perforation.  There was a large wound involving the skin and subcutaneous tissues of the left back and flank region with multiple f foci or gas.  Dominant fluid and gas collection in the left flank soft tissues measuring at least 9.7 cm, concerning for abscess or potential necrotic infection.  There were numerous gallstones.  Bulky uterus with suspected fibroids.  Please see images and full radiology report for further detail.  Review of Systems: As per HPI otherwise 10 point review of systems negative.   Past Medical History:  Diagnosis Date   Adenomatous polyp of rectum    high grade dysplasia, 2005   Chronic constipation    Congenital hydrocephalus (HCC)    GERD (gastroesophageal reflux disease)    Hyperlipidemia    Hypertension    Mental retardation    Seizure disorder ([place])    Seizures ([place])     Past Surgical History:  Procedure Laterality Date   COLONOSCOPY  02/08/06   Rourk-mild anal stenosis, multiple erosions o fprox rectum and sigmoid. Long redundant colon. Bx  ?ischemic colitis   COLONOSCOPY  06/12/2011   Procedure: COLONOSCOPY;  Surgeon: Daneil Dolin, MD;  [facility];  Service: Endoscopy;  Laterality: N/A;  11:00   FLEXIBLE SIGMOIDOSCOPY  08/14/2003   Rourk- Normal-appearing rectum, normal-appearing sigmoid colon to 50  cm.  Relatively poor prep made exam more difficult   patient reports that they had brain surgery attempted for hydrocephalus in infancy. However , the procedure was aborted because she coded. .       reports that she has never smoked. She has  °History and Physical  ° ° °April Watson MRN:[id number] DOB: 03/17/1960 DOA: 07/20/2018 ° °PCP: Simpson, Margaret E, MD ° °Patient coming from: Sent by Dr. Sansone for abdominal distention. ° °I have personally briefly reviewed patient's old medical records in [place] [facility] ° °Chief Complaint: Abdominal distention. ° °HPI: April Watson is a 57 y.o. female with medical history significant of history of high-grade rectum dysplasia, chronic constipation, congenital hydrocephalus, MR, GERD, hyperlipidemia, hypertension, seizure disorder who was sent by Dr. Simpson for evaluation of abdominal distention for several days and progressively worse left flank/lower back wound-started very small back in March, but now has become a lot more extensive and has been draining malodorous fluid since about a week.  No fever, chills, nausea or vomiting per sister.  She has chronic constipation, but has been having a small bowel movements.  She has been following with wound care. ° °ED Course: Initial vital signs temperature 99.3 °F, pulse 82, respiration 19, blood pressure 115/63 mmHg and O2 sat 100% on room air.  The patient received 1500 mL of NS bolus, vancomycin and Zosyn in the emergency department. ° °CBC shows a white count of 19.2 with 77% neutrophils, 12% lymphocytes.  Hemoglobin was 11.2 g/dL and platelets 132.  Lactic acid was 1.4.  Potassium is 2.5, chloride 95 mmol/L.  All other electrolytes are within normal limits when calcium is corrected to albumin.  Glucose 147, BUN 29, creatinine 0.62 and calcium 8.6 mg/dL.  Total protein was 6.0 and albumin 2.2 g/dL, the rest of the hepatic functions are within normal limits. ° °Imaging: CT abdomen/pelvis without contrast shows someone distorted anatomy due to patient's a scoliosis.  There was massive distention of the sigmoid colon, which is contiguous with fluid-filled rectum.  There is no colonic narrowing or evidence of volvulus.  Likely functional obstruction at the  rectosigmoid colon level.  Negative for perforation.  There was a large wound involving the skin and subcutaneous tissues of the left back and flank region with multiple f foci or gas.  Dominant fluid and gas collection in the left flank soft tissues measuring at least 9.7 cm, concerning for abscess or potential necrotic infection.  There were numerous gallstones.  Bulky uterus with suspected fibroids.  Please see images and full radiology report for further detail. ° °Review of Systems: As per HPI otherwise 10 point review of systems negative.  ° °Past Medical History:  °Diagnosis Date  °• Adenomatous polyp of rectum   ° high grade dysplasia, 2005  °• Chronic constipation   °• Congenital hydrocephalus (HCC)   °• GERD (gastroesophageal reflux disease)   °• Hyperlipidemia   °• Hypertension   °• Mental retardation   °• Seizure disorder (HCC)   °• Seizures (HCC)   ° ° °Past Surgical History:  °Procedure Laterality Date  °• COLONOSCOPY  02/08/06  ° Rourk-mild anal stenosis, multiple erosions o fprox rectum and sigmoid. Long redundant colon. Bx  ?ischemic colitis  °• COLONOSCOPY  06/12/2011  ° Procedure: COLONOSCOPY;  Surgeon: Robert M Rourk, MD;  [facility];  Service: Endoscopy;  Laterality: N/A;  11:00  °• FLEXIBLE SIGMOIDOSCOPY  08/14/2003  ° Rourk- Normal-appearing rectum, normal-appearing sigmoid colon to 50  cm.  Relatively poor prep made exam more difficult  °• patient reports that they had brain surgery attempted for hydrocephalus in infancy. However , the procedure was aborted because she coded. .    ° ° ° reports that she has never smoked. She has never   °History and Physical  ° ° °April Watson MRN:[id number] DOB: 03/17/1960 DOA: 07/20/2018 ° °PCP: Simpson, Margaret E, MD ° °Patient coming from: Sent by Dr. Sansone for abdominal distention. ° °I have personally briefly reviewed patient's old medical records in [place] [facility] ° °Chief Complaint: Abdominal distention. ° °HPI: April Watson is a 57 y.o. female with medical history significant of history of high-grade rectum dysplasia, chronic constipation, congenital hydrocephalus, MR, GERD, hyperlipidemia, hypertension, seizure disorder who was sent by Dr. Simpson for evaluation of abdominal distention for several days and progressively worse left flank/lower back wound-started very small back in March, but now has become a lot more extensive and has been draining malodorous fluid since about a week.  No fever, chills, nausea or vomiting per sister.  She has chronic constipation, but has been having a small bowel movements.  She has been following with wound care. ° °ED Course: Initial vital signs temperature 99.3 °F, pulse 82, respiration 19, blood pressure 115/63 mmHg and O2 sat 100% on room air.  The patient received 1500 mL of NS bolus, vancomycin and Zosyn in the emergency department. ° °CBC shows a white count of 19.2 with 77% neutrophils, 12% lymphocytes.  Hemoglobin was 11.2 g/dL and platelets 132.  Lactic acid was 1.4.  Potassium is 2.5, chloride 95 mmol/L.  All other electrolytes are within normal limits when calcium is corrected to albumin.  Glucose 147, BUN 29, creatinine 0.62 and calcium 8.6 mg/dL.  Total protein was 6.0 and albumin 2.2 g/dL, the rest of the hepatic functions are within normal limits. ° °Imaging: CT abdomen/pelvis without contrast shows someone distorted anatomy due to patient's a scoliosis.  There was massive distention of the sigmoid colon, which is contiguous with fluid-filled rectum.  There is no colonic narrowing or evidence of volvulus.  Likely functional obstruction at the  rectosigmoid colon level.  Negative for perforation.  There was a large wound involving the skin and subcutaneous tissues of the left back and flank region with multiple f foci or gas.  Dominant fluid and gas collection in the left flank soft tissues measuring at least 9.7 cm, concerning for abscess or potential necrotic infection.  There were numerous gallstones.  Bulky uterus with suspected fibroids.  Please see images and full radiology report for further detail. ° °Review of Systems: As per HPI otherwise 10 point review of systems negative.  ° °Past Medical History:  °Diagnosis Date  °• Adenomatous polyp of rectum   ° high grade dysplasia, 2005  °• Chronic constipation   °• Congenital hydrocephalus (HCC)   °• GERD (gastroesophageal reflux disease)   °• Hyperlipidemia   °• Hypertension   °• Mental retardation   °• Seizure disorder (HCC)   °• Seizures (HCC)   ° ° °Past Surgical History:  °Procedure Laterality Date  °• COLONOSCOPY  02/08/06  ° Rourk-mild anal stenosis, multiple erosions o fprox rectum and sigmoid. Long redundant colon. Bx  ?ischemic colitis  °• COLONOSCOPY  06/12/2011  ° Procedure: COLONOSCOPY;  Surgeon: Robert M Rourk, MD;  [facility];  Service: Endoscopy;  Laterality: N/A;  11:00  °• FLEXIBLE SIGMOIDOSCOPY  08/14/2003  ° Rourk- Normal-appearing rectum, normal-appearing sigmoid colon to 50  cm.  Relatively poor prep made exam more difficult  °• patient reports that they had brain surgery attempted for hydrocephalus in infancy. However , the procedure was aborted because she coded. .    ° ° ° reports that she has never smoked. She has never   °History and Physical  ° ° °April Watson MRN:[id number] DOB: 10/15/1960 DOA: 07/20/2018 ° °PCP: Simpson, Margaret E, MD ° °Patient coming from: Sent by Dr. Sansone for abdominal distention. ° °I have personally briefly reviewed patient's old medical records in [place] [facility] ° °Chief Complaint: Abdominal distention. ° °HPI: April Watson is a 57 y.o. female with medical history significant of history of high-grade rectum dysplasia, chronic constipation, congenital hydrocephalus, MR, GERD, hyperlipidemia, hypertension, seizure disorder who was sent by Dr. Simpson for evaluation of abdominal distention for several days and progressively worse left flank/lower back wound-started very small back in March, but now has become a lot more extensive and has been draining malodorous fluid since about a week.  No fever, chills, nausea or vomiting per sister.  She has chronic constipation, but has been having a small bowel movements.  She has been following with wound care. ° °ED Course: Initial vital signs temperature 99.3 °F, pulse 82, respiration 19, blood pressure 115/63 mmHg and O2 sat 100% on room air.  The patient received 1500 mL of NS bolus, vancomycin and Zosyn in the emergency department. ° °CBC shows a white count of 19.2 with 77% neutrophils, 12% lymphocytes.  Hemoglobin was 11.2 g/dL and platelets 132.  Lactic acid was 1.4.  Potassium is 2.5, chloride 95 mmol/L.  All other electrolytes are within normal limits when calcium is corrected to albumin.  Glucose 147, BUN 29, creatinine 0.62 and calcium 8.6 mg/dL.  Total protein was 6.0 and albumin 2.2 g/dL, the rest of the hepatic functions are within normal limits. ° °Imaging: CT abdomen/pelvis without contrast shows someone distorted anatomy due to patient's a scoliosis.  There was massive distention of the sigmoid colon, which is contiguous with fluid-filled rectum.  There is no colonic narrowing or evidence of volvulus.  Likely functional obstruction at the  rectosigmoid colon level.  Negative for perforation.  There was a large wound involving the skin and subcutaneous tissues of the left back and flank region with multiple f foci or gas.  Dominant fluid and gas collection in the left flank soft tissues measuring at least 9.7 cm, concerning for abscess or potential necrotic infection.  There were numerous gallstones.  Bulky uterus with suspected fibroids.  Please see images and full radiology report for further detail. ° °Review of Systems: As per HPI otherwise 10 point review of systems negative.  ° °Past Medical History:  °Diagnosis Date  °• Adenomatous polyp of rectum   ° high grade dysplasia, 2005  °• Chronic constipation   °• Congenital hydrocephalus (HCC)   °• GERD (gastroesophageal reflux disease)   °• Hyperlipidemia   °• Hypertension   °• Mental retardation   °• Seizure disorder (HCC)   °• Seizures (HCC)   ° ° °Past Surgical History:  °Procedure Laterality Date  °• COLONOSCOPY  02/08/06  ° Rourk-mild anal stenosis, multiple erosions o fprox rectum and sigmoid. Long redundant colon. Bx  ?ischemic colitis  °• COLONOSCOPY  06/12/2011  ° Procedure: COLONOSCOPY;  Surgeon: Robert M Rourk, MD;  [facility];  Service: Endoscopy;  Laterality: N/A;  11:00  °• FLEXIBLE SIGMOIDOSCOPY  08/14/2003  ° Rourk- Normal-appearing rectum, normal-appearing sigmoid colon to 50  cm.  Relatively poor prep made exam more difficult  °• patient reports that they had brain surgery attempted for hydrocephalus in infancy. However , the procedure was aborted because she coded. .    ° ° ° reports that she has never smoked. She has never

## 2018-07-20 NOTE — ED Notes (Signed)
ED TO INPATIENT HANDOFF REPORT  ED Nurse Name and Phone #: Rosalie Gums, RN  S Name/Age/Gender April Watson 58 y.o. female Room/Bed: APA01/APA01  Code Status   Code Status: Full Code  Home/SNF/Other Home Patient oriented to: self-pt is non verbal Is this baseline? Yes   Triage Complete: Triage complete  Chief Complaint Abdominal Swelling  Triage Note Sent by Dr Moshe Cipro for ABD swelling.  Pt is non verbal. Pt also has wound to Lt lower back that is draining and has an odor.  Currently on abx for wound.     Allergies No Known Allergies  Level of Care/Admitting Diagnosis ED Disposition    ED Disposition Condition [facility] Area: [facility] [[id number]]  Level of Care: Telemetry [5]  Covid Evaluation: Screening Protocol (No Symptoms)  Diagnosis: Sepsis due to cellulitis [facility]) [[id number]]  Admitting Physician: Reubin Milan [[id number]]  Attending Physician: Reubin Milan [[id number]]  Estimated length of stay: past midnight tomorrow  Certification:: I certify this patient will need inpatient services for at least 2 midnights  PT Class (Do Not Modify): Inpatient [101]  PT Acc Code (Do Not Modify): Private [1]       B Medical/Surgery History Past Medical History:  Diagnosis Date  . 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 Chronic constipation   . Congenital hydrocephalus ([place])   . GERD (gastroesophageal reflux disease)   . Hyperlipidemia   . Hypertension   . Mental retardation   . Seizure disorder ([place])   . Seizures ([place])    Past Surgical History:  Procedure Laterality Date  . COLONOSCOPY  02/08/06   Rourk-mild anal stenosis, multiple erosions o fprox rectum and sigmoid. Long redundant colon. Bx  ?ischemic colitis  . COLONOSCOPY  06/12/2011   Procedure: COLONOSCOPY;  Surgeon: Daneil Dolin, MD;  Location: [facility];  Service: Endoscopy;  Laterality: N/A;  11:00  . FLEXIBLE SIGMOIDOSCOPY  08/14/2003   Rourk-  Normal-appearing rectum, normal-appearing sigmoid colon to 50  cm.  Relatively poor prep made exam more difficult  . patient reports that they had brain surgery attempted for hydrocephalus in infancy. However , the procedure was aborted because she coded. .       A IV Location/Drains/Wounds Patient Lines/Drains/Airways Status   Active Line/Drains/Airways    Name:   Placement date:   Placement time:   Site:   Days:   PICC Double Lumen 07/20/18 PICC Right Brachial 40 cm 1 cm   07/20/18    2140     less than 1   External Urinary Catheter   07/20/18    2227    -   less than 1          Intake/Output Last 24 hours No intake or output data in the 24 hours ending 07/20/18 2245  Labs/Imaging Results for orders placed or performed during the hospital encounter of 07/20/18 (from the past 48 hour(s))  Wound or Superficial Culture     Status: None (Preliminary result)   Collection Time: 07/20/18  5:00 PM   Specimen: Skin, Cyst/Tag/Debridement; Wound  Result Value Ref Range   Specimen Description      SKIN BIOPSY Performed at [facility], [address] [place], [place] [address]    Special Requests      Normal Performed at [facility], [address] [place], [place] [address]    Gram Stain      FEW WBC PRESENT,  PREDOMINANTLY PMN ABUNDANT GRAM POSITIVE COCCI ABUNDANT GRAM NEGATIVE RODS FEW GRAM POSITIVE RODS Performed at [facility], [place] [address] [place], [place] [address]    Culture PENDING    Report Status PENDING   SARS Coronavirus 2 (CEPHEID - Performed in [place] hospital lab), Hosp Order     Status: None   Collection Time: 07/20/18  5:02 PM   Specimen: Skin Biopsy; Nasopharyngeal  Result Value Ref Range   SARS Coronavirus 2 NEGATIVE NEGATIVE    Comment: (NOTE) If result is NEGATIVE SARS-CoV-2 target nucleic acids are NOT DETECTED. The SARS-CoV-2 RNA is generally detectable in upper and lower  respiratory specimens during the acute phase of infection. The  lowest  concentration of SARS-CoV-2 viral copies this assay can detect is 250  copies / mL. A negative result does not preclude SARS-CoV-2 infection  and should not be used as the sole basis for treatment or other  patient management decisions.  A negative result may occur with  improper specimen collection / handling, submission of specimen other  than nasopharyngeal swab, presence of viral mutation(s) within the  areas targeted by this assay, and inadequate number of viral copies  (<250 copies / mL). A negative result must be combined with clinical  observations, patient history, and epidemiological information. If result is POSITIVE SARS-CoV-2 target nucleic acids are DETECTED. The SARS-CoV-2 RNA is generally detectable in upper and lower  respiratory specimens dur ing the acute phase of infection.  Positive  results are indicative of active infection with SARS-CoV-2.  Clinical  correlation with patient history and other diagnostic information is  necessary to determine patient infection status.  Positive results do  not rule out bacterial infection or co-infection with other viruses. If result is PRESUMPTIVE POSTIVE SARS-CoV-2 nucleic acids MAY BE PRESENT.   A presumptive positive result was obtained on the submitted specimen  and confirmed on repeat testing.  While 2019 novel coronavirus  (SARS-CoV-2) nucleic acids may be present in the submitted sample  additional confirmatory testing may be necessary for epidemiological  and / or clinical management purposes  to differentiate between  SARS-CoV-2 and other Sarbecovirus currently known to infect humans.  If clinically indicated additional testing with an alternate test  methodology [id number]) is advised. The SARS-CoV-2 RNA is generally  detectable in upper and lower respiratory sp ecimens during the acute  phase of infection. The expected result is Negative. Fact Sheet for Patients:   StrictlyIdeas.no Fact Sheet for Healthcare Providers: BankingDealers.co.za This test is not yet approved or cleared by the [place] FDA and has been authorized for detection and/or diagnosis of SARS-CoV-2 by FDA under an Emergency Use Authorization (EUA).  This EUA will remain in effect (meaning this test can be used) for the duration of the COVID-19 declaration under Section 564(b)(1) of the Act, 21 U.S.C. section 360bbb-3(b)(1), unless the authorization is terminated or revoked sooner. Performed at [facility], [address] [place], [place] [address]   CBC with Differential/Platelet     Status: Abnormal   Collection Time: 07/20/18  7:45 PM  Result Value Ref Range   WBC 19.2 (H) 4.0 - 10.5 K/uL   RBC 3.94 3.87 - 5.11 MIL/uL   Hemoglobin 11.2 (L) 12.0 - 15.0 g/dL   HCT 35.6 (L) 36.0 - 46.0 %   MCV 90.4 80.0 - 100.0 fL   MCH 28.4 26.0 - 34.0 pg   MCHC 31.5 30.0 - 36.0 g/dL   RDW 14.2 11.5 - 15.5 %  Platelets 132 (L) 150 - 400 K/uL    Comment: SPECIMEN CHECKED FOR CLOTS   nRBC 0.0 0.0 - 0.2 %   Neutrophils Relative % 77 %   Neutro Abs 14.6 (H) 1.7 - 7.7 K/uL   Lymphocytes Relative 12 %   Lymphs Abs 2.3 0.7 - 4.0 K/uL   Monocytes Relative 10 %   Monocytes Absolute 1.9 (H) 0.1 - 1.0 K/uL   Eosinophils Relative 0 %   Eosinophils Absolute 0.1 0.0 - 0.5 K/uL   Basophils Relative 0 %   Basophils Absolute 0.1 0.0 - 0.1 K/uL   Immature Granulocytes 1 %   Abs Immature Granulocytes 0.24 (H) 0.00 - 0.07 K/uL    Comment: Performed at [facility], [address], [place], [place] [address]  Comprehensive metabolic panel     Status: Abnormal   Collection Time: 07/20/18  7:45 PM  Result Value Ref Range   Sodium 138 135 - 145 mmol/L   Potassium 2.5 (LL) 3.5 - 5.1 mmol/L    Comment: SLIGHT HEMOLYSIS CRITICAL RESULT CALLED TO, READ BACK BY AND VERIFIED WITH: Samary Shatz,C ON 07/20/18 AT 2030 BY LOY,C    Chloride 95 (L) 98 - 111 mmol/L   CO2  28 22 - 32 mmol/L   Glucose, Bld 147 (H) 70 - 99 mg/dL   BUN 29 (H) 6 - 20 mg/dL   Creatinine, Ser 0.62 0.44 - 1.00 mg/dL   Calcium 8.6 (L) 8.9 - 10.3 mg/dL   Total Protein 6.0 (L) 6.5 - 8.1 g/dL   Albumin 2.2 (L) 3.5 - 5.0 g/dL   AST 18 15 - 41 U/L   ALT 16 0 - 44 U/L   Alkaline Phosphatase 74 38 - 126 U/L   Total Bilirubin 0.8 0.3 - 1.2 mg/dL   GFR calc non Af Amer >60 >60 mL/min   GFR calc Af Amer >60 >60 mL/min   Anion gap 15 5 - 15    Comment: Performed at [facility], [address], [place], [place] [address]  Lactic acid, plasma     Status: None   Collection Time: 07/20/18  7:45 PM  Result Value Ref Range   Lactic Acid, Venous 1.4 0.5 - 1.9 mmol/L    Comment: Performed at [facility], [address], [place], [place] [address]  Blood culture (routine x 2)     Status: None (Preliminary result)   Collection Time: 07/20/18  9:40 PM   Specimen: BLOOD  Result Value Ref Range   Specimen Description BLOOD PIC LINE    Special Requests      BOTTLES DRAWN AEROBIC AND ANAEROBIC Blood Culture adequate volume Performed at [facility], [address], [place],  [address]    Culture PENDING    Report Status PENDING    Ct Abdomen Pelvis Wo Contrast  Result Date: 07/20/2018 CLINICAL DATA:  Abdomen distension EXAM: CT ABDOMEN AND PELVIS WITHOUT CONTRAST TECHNIQUE: Multidetector CT imaging of the abdomen and pelvis was performed following the standard protocol without IV contrast. COMPARISON:  Barium enema 05/14/2003 FINDINGS: Lower chest: Lung bases demonstrate partial atelectasis at the left base. Trace left pleural effusion. Heart size within normal limits. Hepatobiliary: No focal hepatic abnormality. Numerous calcified gallstones. No biliary dilatation. Pancreas: Unremarkable. No pancreatic ductal dilatation or surrounding inflammatory changes. Spleen: Normal in size without focal abnormality. Adrenals/Urinary Tract: Adrenal glands are normal. Kidneys show no hydronephrosis. Probable  cyst upper pole left kidney. Urinary bladder is unremarkable. Stomach/Bowel: The stomach is nonenlarged. No dilated small bowel.  Extremely tortuous and redundant colon. No colon wall thickening. Massive distension of the sigmoid colon up to 16 cm with fluid appearing stools in the rectum. Negative appendix. Vascular/Lymphatic: Nonaneurysmal aorta. No significantly enlarged lymph nodes. Reproductive: Enlarged uterus with suspected fundal mass measuring 7.8 cm. No definite adnexal mass. Other: No free air or free fluid. Musculoskeletal: Scoliosis and degenerative changes of the spine. Chronic appearing bony remodeling of the proximal left femur. Skin thickening and irregular soft tissue density left lateral hip extending toward the femoral trochanter which may reflect decubitus ulcer. No acute gas or fluid in this region. Left upper back soft tissue wound or ulcer with subcutaneous edema and multiple foci of gas. This is contiguous with a large gas and fluid collection measuring at least 3.4 x 9.7 cm within the left flank subcutaneous soft tissues. No gross bony destructive change. IMPRESSION: 1. Anatomy somewhat distorted by patient's scoliosis. There is massive distention of the sigmoid colon which is contiguous with fluid filled rectum. No discrete colonic narrowing or evidence for volvulus. Findings suggest functional obstruction at the level of the rectosigmoid colon. Negative for perforation. 2. Large wound involving the skin and subcutaneous tissues of the left back and flank region with multiple foci of gas. Dominant fluid and gas collection in the left flank soft tissues measuring at least 9.7 cm, concerning for abscess or potential necrotic infection. Recommend correlation with direct inspection. 3. Numerous gallstones 4. Bulky uterus with suspected fibroids. Electronically Signed   By: Donavan Foil M.D.   On: 07/20/2018 21:23   [place] Ekg Site Rite  Result Date: 07/20/2018 If Site Rite image not attached,  placement could not be confirmed due to current cardiac rhythm.   Pending Labs Unresulted Labs (From admission, onward)    Start     Ordered   07/27/18 0500  Creatinine, serum  (enoxaparin (LOVENOX)    CrCl >/= 30 ml/min)  Weekly,   R    Comments: while on enoxaparin therapy    07/20/18 2209   07/21/18 0500  HIV antibody (Routine Testing)  Tomorrow morning,   R     07/20/18 2209   07/21/18 0500  CBC WITH DIFFERENTIAL  Daily,   R     07/20/18 2209   07/21/18 7793  Basic metabolic panel  Tomorrow morning,   R     07/20/18 2209   07/20/18 2209  Magnesium  Add-on,   AD     07/20/18 2209   07/20/18 2209  Phosphorus  Add-on,   AD     07/20/18 2209   07/20/18 1703  Blood culture (routine x 2)  BLOOD CULTURE X 2,   STAT     07/20/18 1702          Vitals/Pain Today's Vitals   07/20/18 1653 07/20/18 2003 07/20/18 2100 07/20/18 2200  BP:  103/62 105/63 111/72  Pulse:  82 79 81  Resp:  20 (!) 22 (!) 25  Temp:      TempSrc:      SpO2:  95% 99% 99%  Weight: 72.6 kg     PainSc: 0-No pain       Isolation Precautions No active isolations  Medications Medications  piperacillin-tazobactam (ZOSYN) IVPB 3.375 g (3.375 g Intravenous Transfusing/Transfer 07/20/18 2244)  potassium chloride 10 mEq in 100 mL IVPB (has no administration in time range)  potassium chloride 10 mEq in 100 mL IVPB (10 mEq Intravenous Transfusing/Transfer 07/20/18 2244)  sodium chloride flush (NS) 0.9 % injection 10-40 mL (10  mLs Intracatheter Not Given 07/20/18 2212)  sodium chloride flush (NS) 0.9 % injection 10-40 mL (has no administration in time range)  enoxaparin (LOVENOX) injection 40 mg (has no administration in time range)  0.9 % NaCl with KCl 40 mEq / L  infusion (has no administration in time range)  magnesium sulfate IVPB 2 g 50 mL (has no administration in time range)  acetaminophen (TYLENOL) tablet 650 mg (has no administration in time range)  acetaminophen (TYLENOL) suppository 650 mg (has no  administration in time range)  prochlorperazine (COMPAZINE) injection 5 mg (has no administration in time range)  vancomycin (VANCOCIN) IVPB 1000 mg/200 mL premix (has no administration in time range)  piperacillin-tazobactam (ZOSYN) IVPB 3.375 g (has no administration in time range)  sodium chloride 0.9 % bolus 500 mL (500 mLs Intravenous Transfusing/Transfer 07/20/18 2244)  sodium chloride 0.9 % bolus 1,000 mL (1,000 mLs Intravenous Transfusing/Transfer 07/20/18 2245)    Mobility non-ambulatory Low fall risk   Focused Assessments    R Recommendations: See Admitting Provider Note  Report given to:   Additional Notes:

## 2018-07-20 NOTE — ED Provider Notes (Signed)
[facility] Provider Note   CSN: [id number] Arrival date & time: 07/20/18  1639     History   Chief Complaint No chief complaint on file.   HPI April Watson is a 58 y.o. female.     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  She has a decubitus that is been draining and not getting better with antibiotics  The history is provided by a relative. No language interpreter was used.  Abscess Abscess location: Right flank. Abscess quality: draining   Red streaking: no   Progression:  Worsening Chronicity:  Recurrent Context: not injected drug use   Relieved by:  Nothing Worsened by:  Nothing Ineffective treatments:  None tried   Past Medical History:  Diagnosis Date   Adenomatous polyp of rectum    high grade dysplasia, 2005   Chronic constipation    Congenital hydrocephalus ([place])    GERD (gastroesophageal reflux disease)    Hyperlipidemia    Hypertension    Mental retardation    Seizure disorder ([place])    Seizures ([place])     Patient Active Problem List   Diagnosis Date Noted   Abdominal distension 07/20/2018   Weakness 07/20/2018   Sacral ulcer, limited to breakdown of skin ([place]) 04/06/2018   Dermatomycosis 04/06/2018   Quadriplegia and quadriparesis ([place]) 09/11/2016   Tinea cruris 06/05/2015   Vitamin D deficiency 01/21/2014   Flexion contractures 05/18/2013   Allergic cough 11/17/2012   Family history of colon cancer 05/19/2011   Type 2 diabetes mellitus with neurological complications ([place]) 49/75/3005   COLONIC POLYPS, ADENOMATOUS, HX OF 11/30/2008   GERD 11/29/2008   Hyperlipidemia LDL goal <100 08/17/2007   Essential hypertension 08/17/2007   CONSTIPATION, CHRONIC 08/17/2007   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 Long redundant colon. Bx  ?ischemic colitis   COLONOSCOPY  06/12/2011   Procedure: COLONOSCOPY;  Surgeon: Daneil Dolin, MD;  Location: [facility];  Service: Endoscopy;  Laterality: N/A;  11:00   FLEXIBLE SIGMOIDOSCOPY  08/14/2003   Rourk- Normal-appearing rectum, normal-appearing sigmoid colon to 50  cm.  Relatively poor prep made exam more difficult   patient reports that they had brain surgery attempted for hydrocephalus in infancy. However , the procedure was aborted because she coded. .       OB History   No obstetric history on file.      Home Medications    Prior to Admission medications   Medication Sig Start Date End Date Taking? Authorizing Provider  bisoprolol-hydrochlorothiazide (ZIAC) 5-6.25 MG tablet TAKE (1) TABLET BY MOUTH ONCE DAILY FOR BLOOD PRESSURE. Patient taking differently: Take 1 tablet by mouth daily.  03/22/18  Yes Fayrene Helper, MD  docusate sodium (STOOL SOFTENER) 100 MG capsule TAKE 1 CAPSULE TWICE DAILY FOR CONSTIPATION. Patient taking differently: Take 100 mg by mouth 2 (two) times daily.  06/19/14  Yes Fayrene Helper, MD  doxycycline (VIBRA-TABS) 100 MG tablet Take 1 tablet (100 mg total) by mouth 2 (two) times daily. 07/12/18  Yes Fayrene Helper, MD  montelukast (SINGULAIR) 10 MG tablet Take 1 tablet (10 mg total) by mouth at bedtime. 04/06/18  Yes Fayrene Helper, MD  pantoprazole (PROTONIX) 20 MG tablet Take 1 tablet (20 mg total) by mouth daily. 05/24/18  Yes Fayrene Helper,  MD  PHENobarbital (LUMINAL) 32.4 MG tablet TAKE ONE TABLET BY MOUTH TWICE DAILY FOR SEIZURE DISORDER. Patient taking differently: Take 32.4 mg by mouth 2 (two) times daily. FOR SEIZURE DISORDER. 03/30/18  Yes Fayrene Helper, MD  polyethylene glycol powder (GLYCOLAX/MIRALAX) powder MIX 1 CAPFUL (17 G) IN 8 OUNCES OF WATER/JUICE AND DRINK ONCE DAILY FOR CONSTIPATION. Patient taking differently: Take 17 g by mouth See admin instructions. MIX 1  CAPFUL (17 G) IN 8 OUNCES OF WATER/JUICE AND DRINK ONCE DAILY FOR CONSTIPATION. 11/11/16  Yes Fayrene Helper, MD  potassium chloride (K-DUR) 10 MEQ tablet TAKE (1) TABLET BY MOUTH TWICE DAILY WITH FOOD. Patient taking differently: Take 10 mEq by mouth 2 (two) times daily with a meal.  07/20/18  Yes Fayrene Helper, MD  simvastatin (ZOCOR) 10 MG tablet TAKE (1) TABLET BY MOUTH AT BEDTIME FOR CHOLESTEROL. Patient taking differently: Take 10 mg by mouth at bedtime.  04/28/18  Yes Fayrene Helper, MD  Vitamin D, Ergocalciferol, (DRISDOL) 1.25 MG (50000 UT) CAPS capsule TAKE 1 CAPSULE BY MOUTH EVERY 2 WEEKS. Patient taking differently: Take 50,000 Units by mouth every 14 (fourteen) days.  04/28/18  Yes Fayrene Helper, MD    Family History Family History  Problem Relation Age of Onset   Kidney failure Mother        on dialysis   Hypertension Mother    Kidney disease Mother    Prostate cancer Father    Hypertension Father    Glaucoma Father    Colon cancer Sister 29   Multiple myeloma Sister    Hypertension Sister    Glaucoma Sister    Allergies Sister    Hypertension Sister    Glaucoma Sister    Hyperlipidemia Sister    Hypertension Brother    Allergies Brother    Hyperlipidemia Brother    Colon cancer Other        family history    Lymphoma Other        family history     Social History Social History   Tobacco Use   Smoking status: Never Smoker   Smokeless tobacco: Never Used  Substance Use Topics   Alcohol use: No   Drug use: No     Allergies   Patient has no known allergies.   Review of Systems Review of Systems  Unable to perform ROS: Mental status change     Physical Exam Updated Vital Signs BP 105/63    Pulse 79    Temp 99.3 F (37.4 C) (Oral)    Resp (!) 22    Wt 72.6 kg    SpO2 99%   Physical Exam Vitals signs and nursing note reviewed.  Constitutional:      Appearance: She is well-developed.  HENT:     Head:  Normocephalic.     Nose: Nose normal.  Eyes:     General: No scleral icterus.    Conjunctiva/sclera: Conjunctivae normal.  Neck:     Musculoskeletal: Neck supple.     Thyroid: No thyromegaly.  Cardiovascular:     Rate and Rhythm: Normal rate and regular rhythm.     Heart sounds: No murmur. No friction rub. No gallop.   Pulmonary:     Breath sounds: No stridor. No wheezing or rales.  Chest:     Chest wall: No tenderness.  Abdominal:     General: There is no distension.     Tenderness: There is no abdominal tenderness. There is no rebound.  Musculoskeletal:     Comments: Fractures to both legs and both arms  Lymphadenopathy:     Cervical: No cervical adenopathy.  Skin:    Findings: No erythema or rash.     Comments: Decubitus draining in the right flank area  Neurological:     Mental Status: She is alert.     Motor: No abnormal muscle tone.     Coordination: Coordination normal.     Comments: Patient is alert but does not speak      ED Treatments / Results  Labs (all labs ordered are listed, but only abnormal results are displayed) Labs Reviewed  CBC WITH DIFFERENTIAL/PLATELET - Abnormal; Notable for the following components:      Result Value   WBC 19.2 (*)    Hemoglobin 11.2 (*)    HCT 35.6 (*)    Platelets 132 (*)    Neutro Abs 14.6 (*)    Monocytes Absolute 1.9 (*)    Abs Immature Granulocytes 0.24 (*)    All other components within normal limits  COMPREHENSIVE METABOLIC PANEL - Abnormal; Notable for the following components:   Potassium 2.5 (*)    Chloride 95 (*)    Glucose, Bld 147 (*)    BUN 29 (*)    Calcium 8.6 (*)    Total Protein 6.0 (*)    Albumin 2.2 (*)    All other components within normal limits  AEROBIC CULTURE (SUPERFICIAL SPECIMEN)  SARS CORONAVIRUS 2 (HOSPITAL ORDER, PERFORMED IN [facility])  CULTURE, BLOOD (ROUTINE X 2)  CULTURE, BLOOD (ROUTINE X 2)  LACTIC ACID, PLASMA    EKG None  Radiology Ct Abdomen Pelvis Wo  Contrast  Result Date: 07/20/2018 CLINICAL DATA:  Abdomen distension EXAM: CT ABDOMEN AND PELVIS WITHOUT CONTRAST TECHNIQUE: Multidetector CT imaging of the abdomen and pelvis was performed following the standard protocol without IV contrast. COMPARISON:  Barium enema 05/14/2003 FINDINGS: Lower chest: Lung bases demonstrate partial atelectasis at the left base. Trace left pleural effusion. Heart size within normal limits. Hepatobiliary: No focal hepatic abnormality. Numerous calcified gallstones. No biliary dilatation. Pancreas: Unremarkable. No pancreatic ductal dilatation or surrounding inflammatory changes. Spleen: Normal in size without focal abnormality. Adrenals/Urinary Tract: Adrenal glands are normal. Kidneys show no hydronephrosis. Probable cyst upper pole left kidney. Urinary bladder is unremarkable. Stomach/Bowel: The stomach is nonenlarged. No dilated small bowel. Extremely tortuous and redundant colon. No colon wall thickening. Massive distension of the sigmoid colon up to 16 cm with fluid appearing stools in the rectum. Negative appendix. Vascular/Lymphatic: Nonaneurysmal aorta. No significantly enlarged lymph nodes. Reproductive: Enlarged uterus with suspected fundal mass measuring 7.8 cm. No definite adnexal mass. Other: No free air or free fluid. Musculoskeletal: Scoliosis and degenerative changes of the spine. Chronic appearing bony remodeling of the proximal left femur. Skin thickening and irregular soft tissue density left lateral hip extending toward the femoral trochanter which may reflect decubitus ulcer. No acute gas or fluid in this region. Left upper back soft tissue wound or ulcer with subcutaneous edema and multiple foci of gas. This is contiguous with a large gas and fluid collection measuring at least 3.4 x 9.7 cm within the left flank subcutaneous soft tissues. No gross bony destructive change. IMPRESSION: 1. Anatomy somewhat distorted by patient's scoliosis. There is massive  distention of the sigmoid colon which is contiguous with fluid filled rectum. No discrete colonic narrowing or evidence for volvulus. Findings suggest functional obstruction at the level of the rectosigmoid colon. Negative for perforation. 2.  Large wound involving the skin and subcutaneous tissues of the left back and flank region with multiple foci of gas. Dominant fluid and gas collection in the left flank soft tissues measuring at least 9.7 cm, concerning for abscess or potential necrotic infection. Recommend correlation with direct inspection. 3. Numerous gallstones 4. Bulky uterus with suspected fibroids. Electronically Signed   By: Donavan Foil M.D.   On: 07/20/2018 21:23   [place] Ekg Site Rite  Result Date: 07/20/2018 If Site Rite image not attached, placement could not be confirmed due to current cardiac rhythm.   Procedures Procedures (including critical care time)  Medications Ordered in ED Medications  sodium chloride 0.9 % bolus 500 mL (has no administration in time range)  vancomycin (VANCOCIN) IVPB 1000 mg/200 mL premix (has no administration in time range)  piperacillin-tazobactam (ZOSYN) IVPB 3.375 g (has no administration in time range)  potassium chloride 10 mEq in 100 mL IVPB (has no administration in time range)  potassium chloride 10 mEq in 100 mL IVPB (has no administration in time range)  sodium chloride 0.9 % bolus 1,000 mL (has no administration in time range)  sodium chloride flush (NS) 0.9 % injection 10-40 mL (has no administration in time range)  sodium chloride flush (NS) 0.9 % injection 10-40 mL (has no administration in time range)     Initial Impression / Assessment and Plan / ED Course  I have reviewed the triage vital signs and the nursing notes.  Pertinent labs & imaging results that were available during my care of the patient were reviewed by me and considered in my medical decision making (see chart for details).       CRITICAL CARE Performed by:  Milton Ferguson Total critical care time:83mnutes Critical care time was exclusive of separately billable procedures and treating other patients. Critical care was necessary to treat or prevent imminent or life-threatening deterioration. Critical care was time spent personally by me on the following activities: development of treatment plan with patient and/or surrogate as well as nursing, discussions with consultants, evaluation of patient's response to treatment, examination of patient, obtaining history from patient or surrogate, ordering and performing treatments and interventions, ordering and review of laboratory studies, ordering and review of radiographic studies, pulse oximetry and re-evaluation of patient's condition.  Large draining abscess to right flank.  Hypokalemia.  Patient will be admitted to medicine and surgery will consult and drain the abscess tomorrow Final Clinical Impressions(s) / ED Diagnoses   Final diagnoses:  Abscess    ED Discharge Orders    None       ZMilton Ferguson MD 07/20/18 2206

## 2018-07-20 NOTE — ED Notes (Signed)
2nd set of blood cultures collected by Levada Dy, RN from newly inserted PICC line

## 2018-07-20 NOTE — ED Notes (Signed)
All IV medications and fluids will be started as soon as PICC line has been established and patent placement verified.

## 2018-07-20 NOTE — Progress Notes (Signed)
Peripherally Inserted Central Catheter/Midline Placement  The IV Nurse has discussed with the patient and/or persons authorized to consent for the patient, the purpose of this procedure and the potential benefits and risks involved with this procedure.  The benefits include less needle sticks, lab draws from the catheter, and the patient may be discharged home with the catheter. Risks include, but not limited to, infection, bleeding, blood clot (thrombus formation), and puncture of an artery; nerve damage and irregular heartbeat and possibility to perform a PICC exchange if needed/ordered by physician.  Alternatives to this procedure were also discussed.  [facility] PICC patient education guide, fact sheet on infection prevention and patient information card has been provided to patient /or left at bedside.  Consent obtained from sister due to altered mental status.   PICC/Midline Placement Documentation  PICC Double Lumen 07/20/18 PICC Right Brachial 40 cm 1 cm (Active)  Indication for Insertion or Continuance of Line Poor Vasculature-patient has had multiple peripheral attempts or PIVs lasting less than 24 hours;Chronic illness with exacerbations (CF, Sickle Cell, etc.);Limited venous access - need for IV therapy >5 days (PICC only) 07/20/18 2157  Exposed Catheter (cm) 1 cm 07/20/18 2157  Site Assessment Clean;Dry;Intact 07/20/18 2157  Lumen #1 Status Flushed;Saline locked;Blood return noted 07/20/18 2157  Lumen #2 Status Flushed;Saline locked;Blood return noted 07/20/18 2157  Dressing Type Transparent 07/20/18 2157  Dressing Status Clean;Dry;Intact 07/20/18 2157  Dressing Intervention New dressing 07/20/18 2157  Dressing Change Due 07/27/18 07/20/18 2157       April Watson, Nicolette Bang 07/20/2018, 9:58 PM

## 2018-07-20 NOTE — Assessment & Plan Note (Signed)
Inability to hold up neck and increased genewralized weakness noted by famly in past approximately 2 weeks, this associated with overall deterioration in function. ED eval recommended

## 2018-07-20 NOTE — ED Notes (Signed)
1-Pt has stage II pressure injury to sacrum (actually two wounds that are Not connected- one beneath the other) 2-Stage II pressure injury to left ischium 3-Wound to left flank/back area that could have possibly started from pressure, as pt favors her left side when lying in bed, however CT scans suggests infection/abcess, this wound is covered with dressing and pt has been receiving wound care from home health nurse per pt sister.

## 2018-07-20 NOTE — Patient Instructions (Signed)
We will contact , Kathlee Nations at [facility], [phone] ask for measurement s of wounds and pictures,   Labs CBc, cmp and EGFR  Portable chest and abdomen x ray  Culture form wounds to be sent   If [place] worsens  As in develops fever, stops or markedly reduces her intake, then you do need to take her to the ED

## 2018-07-20 NOTE — ED Notes (Signed)
Blood and one set of blood cultures obtained via femoral stick (left) by Dr Roderic Palau.  [facility] PICC line team in route at this time to initiate PICC line per Legrand Como, RN, charge nurse.   Multiple attempts by different nurses for IV access was unsuccessful.

## 2018-07-20 NOTE — Telephone Encounter (Signed)
Kathlee Nations nurse from [facility] called and she was there to see pt and she was unable to draw adequate blood sample and her wound was necrotic and abdomen greatly distended and hard. Advised nurse to cancel mobile xray and have pt taken to the ER. She agrees

## 2018-07-20 NOTE — ED Notes (Signed)
Pt in CT at this time.

## 2018-07-20 NOTE — Assessment & Plan Note (Signed)
Increasing abdominal disension with poor appetite x 2 weeks, recommend Ed visit , however family hesitant will try to arrange portable abdominal X ray, report is that her bowel movements are noit changed and she does not appear to have pain when her abdomen is compressed

## 2018-07-20 NOTE — Progress Notes (Signed)
Virtual Visit via Telephone Note  I connected with April Watson on 07/20/18 at 10:20 AM EDT by telephone and verified that I am speaking with the correct person using two identifiers.  Location: Patient: home Provider: office   I discussed the limitations, risks, security and privacy concerns of performing an evaluation and management service by telephone and the availability of in person appointments. I also discussed with the patient that there may be a patient responsible charge related to this service. The patient expressed understanding and agreed to proceed.   History of Present Illness:history is provided by sister and sister in law as patient is incapable April Watson has had an ulcer on her buttock x 3 months, at one time seemed to be improving and has had home health involvement In the past 1 to 2 weeks sister reports worsening rather than improvement in her ulcers, increasing in size and number, and with malodorous drainage despite antibiotic course also with some bleeding/ oozing  There is no fever or chills noted, however , appetitie is decreased and abdomen distended, though not noted to be tender to touch Also noted to be less able to hold neck upright Observations/Objective: There were no vitals taken for this visit.   Assessment and Plan:  Sacral ulcer, limited to breakdown of skin ([place]) Ulcer present for over 6 weeks increasing in size and infected with purulent drainage in the past 2 weeks. Home health nurse assessment today , measyre wounds, she does have an appt with wound care in next seveal weeks Home health viasit today to assess wounds, send cultures and obtain lab draw, very concerned that she will likley need ED evaluation   Abdominal distension Increasing abdominal disension with poor appetite x 2 weeks, recommend Ed visit , however family hesitant will try to arrange portable abdominal X ray, report is that her bowel movements are noit changed and she does not appear  to have pain when her abdomen is compressed   Follow Up Instructions:    I discussed the assessment and treatment plan with the patient. The patient was provided an opportunity to ask questions and all were answered. The patient agreed with the plan and demonstrated an understanding of the instructions.   The patient was advised to call back or seek an in-person evaluation if the symptoms worsen or if the condition fails to improve as anticipated.  I provided 25 minutes of non-face-to-face time during this encounter.   April Nakayama, MD

## 2018-07-20 NOTE — ED Notes (Signed)
Levada Dy, RN at bedside to insert PICC line. Family member escorted out of pt room while sterile procedure in progress.

## 2018-07-20 NOTE — ED Notes (Signed)
Difficulty obtaining PIV at this time. Liz Beach, Agricultural consultant notified and is coming to assess pt for PIV. Dr. Roderic Palau aware pt is a difficult stick.

## 2018-07-20 NOTE — ED Triage Notes (Addendum)
Sent by Dr Moshe Cipro for ABD swelling.  Pt is non verbal. Pt also has wound to Lt lower back that is draining and has an odor.  Currently on abx for wound.

## 2018-07-20 NOTE — Assessment & Plan Note (Signed)
Ulcer present for over 6 weeks increasing in size and infected with purulent drainage in the past 2 weeks. Home health nurse assessment today , measyre wounds, she does have an appt with wound care in next seveal weeks Home health viasit today to assess wounds, send cultures and obtain lab draw, very concerned that she will likley need ED evaluation

## 2018-07-20 NOTE — ED Notes (Signed)
Zacarias Pontes PICC nurse called and said that April Watson, another PICC nurse should be calling us within the hour about placing PICC line tonight.

## 2018-07-21 DIAGNOSIS — L89103 Pressure ulcer of unspecified part of back, stage 3: Secondary | ICD-10-CM

## 2018-07-21 DIAGNOSIS — L0291 Cutaneous abscess, unspecified: Secondary | ICD-10-CM

## 2018-07-21 DIAGNOSIS — L89322 Pressure ulcer of left buttock, stage 2: Secondary | ICD-10-CM

## 2018-07-21 DIAGNOSIS — L89152 Pressure ulcer of sacral region, stage 2: Secondary | ICD-10-CM

## 2018-07-21 LAB — BASIC METABOLIC PANEL
Anion gap: 14 (ref 5–15)
BUN: 25 mg/dL — ABNORMAL HIGH (ref 6–20)
CO2: 27 mmol/L (ref 22–32)
Calcium: 8.4 mg/dL — ABNORMAL LOW (ref 8.9–10.3)
Chloride: 99 mmol/L (ref 98–111)
Creatinine, Ser: 0.68 mg/dL (ref 0.44–1.00)
GFR calc Af Amer: >60 mL/min (ref >60)
GFR calc non Af Amer: >60 mL/min (ref >60)
Glucose, Bld: 172 mg/dL — ABNORMAL HIGH (ref 70–99)
Potassium: 2.4 mmol/L — CL (ref 3.5–5.1)
Sodium: 140 mmol/L (ref 135–145)

## 2018-07-21 LAB — CBC WITH DIFFERENTIAL/PLATELET
Abs Immature Granulocytes: 0.19 10*3/uL — ABNORMAL HIGH (ref 0.00–0.07)
Basophils Absolute: 0.1 10*3/uL (ref 0.0–0.1)
Basophils Relative: 0 %
Eosinophils Absolute: 0.1 10*3/uL (ref 0.0–0.5)
Eosinophils Relative: 0 %
HCT: 32.6 % — ABNORMAL LOW (ref 36.0–46.0)
Hemoglobin: 10.3 g/dL — ABNORMAL LOW (ref 12.0–15.0)
Immature Granulocytes: 1 %
Lymphocytes Relative: 11 %
Lymphs Abs: 2.3 10*3/uL (ref 0.7–4.0)
MCH: 29 pg (ref 26.0–34.0)
MCHC: 31.6 g/dL (ref 30.0–36.0)
MCV: 91.8 fL (ref 80.0–100.0)
Monocytes Absolute: 1.7 10*3/uL — ABNORMAL HIGH (ref 0.1–1.0)
Monocytes Relative: 8 %
Neutro Abs: 16.2 10*3/uL — ABNORMAL HIGH (ref 1.7–7.7)
Neutrophils Relative %: 80 %
Platelets: 292 10*3/uL (ref 150–400)
RBC: 3.55 MIL/uL — ABNORMAL LOW (ref 3.87–5.11)
RDW: 14.4 % (ref 11.5–15.5)
WBC: 20.5 10*3/uL — ABNORMAL HIGH (ref 4.0–10.5)
nRBC: 0 % (ref 0.0–0.2)

## 2018-07-21 LAB — LACTIC ACID, PLASMA: Lactic Acid, Venous: 1.1 mmol/L (ref 0.5–1.9)

## 2018-07-21 MED ORDER — LACTATED RINGERS IV SOLN
INTRAVENOUS | Status: DC
  Administered 2018-07-21: 18:00:00 via INTRAVENOUS

## 2018-07-21 MED ORDER — MONTELUKAST SODIUM 10 MG PO TABS
10.0000 mg | ORAL_TABLET | Freq: Every day | ORAL | Status: DC
  Administered 2018-07-21 – 2018-07-26 (×7): 10 mg via ORAL
  Filled 2018-07-21 (×7): qty 1

## 2018-07-21 MED ORDER — SIMVASTATIN 10 MG PO TABS
10.0000 mg | ORAL_TABLET | Freq: Every day | ORAL | Status: DC
  Administered 2018-07-21 – 2018-07-26 (×7): 10 mg via ORAL
  Filled 2018-07-21 (×7): qty 1

## 2018-07-21 MED ORDER — BISOPROLOL-HYDROCHLOROTHIAZIDE 5-6.25 MG PO TABS
1.0000 | ORAL_TABLET | Freq: Every day | ORAL | Status: DC
  Filled 2018-07-21 (×2): qty 1

## 2018-07-21 MED ORDER — POTASSIUM CHLORIDE CRYS ER 20 MEQ PO TBCR
40.0000 meq | EXTENDED_RELEASE_TABLET | Freq: Three times a day (TID) | ORAL | Status: AC
  Administered 2018-07-21 (×3): 40 meq via ORAL
  Filled 2018-07-21 (×3): qty 2

## 2018-07-21 MED ORDER — DOCUSATE SODIUM 100 MG PO CAPS
100.0000 mg | ORAL_CAPSULE | Freq: Two times a day (BID) | ORAL | Status: DC
  Administered 2018-07-21 – 2018-07-26 (×13): 100 mg via ORAL
  Filled 2018-07-21 (×13): qty 1

## 2018-07-21 MED ORDER — LORATADINE 10 MG PO TABS
10.0000 mg | ORAL_TABLET | Freq: Every day | ORAL | Status: DC
  Administered 2018-07-21 – 2018-07-27 (×7): 10 mg via ORAL
  Filled 2018-07-21 (×7): qty 1

## 2018-07-21 MED ORDER — POLYETHYLENE GLYCOL 3350 17 GM/SCOOP PO POWD
17.0000 g | Freq: Every day | ORAL | Status: DC | PRN
  Filled 2018-07-21: qty 255

## 2018-07-21 MED ORDER — PANTOPRAZOLE SODIUM 20 MG PO TBEC
20.0000 mg | DELAYED_RELEASE_TABLET | Freq: Every day | ORAL | Status: DC
  Filled 2018-07-21 (×2): qty 1

## 2018-07-21 MED ORDER — VANCOMYCIN HCL IN DEXTROSE 750-5 MG/150ML-% IV SOLN
750.0000 mg | Freq: Two times a day (BID) | INTRAVENOUS | Status: DC
  Administered 2018-07-21 – 2018-07-23 (×4): 750 mg via INTRAVENOUS
  Filled 2018-07-21 (×5): qty 150

## 2018-07-21 MED ORDER — PANTOPRAZOLE SODIUM 40 MG PO TBEC
40.0000 mg | DELAYED_RELEASE_TABLET | Freq: Every day | ORAL | Status: DC
  Administered 2018-07-21 – 2018-07-27 (×7): 40 mg via ORAL
  Filled 2018-07-21 (×7): qty 1

## 2018-07-21 MED ORDER — PHENOBARBITAL 32.4 MG PO TABS
32.4000 mg | ORAL_TABLET | Freq: Two times a day (BID) | ORAL | Status: DC
  Administered 2018-07-21 – 2018-07-27 (×14): 32.4 mg via ORAL
  Filled 2018-07-21 (×14): qty 1

## 2018-07-21 MED ORDER — POTASSIUM CHLORIDE 10 MEQ/100ML IV SOLN
10.0000 meq | INTRAVENOUS | Status: AC
  Administered 2018-07-21 (×6): 10 meq via INTRAVENOUS
  Filled 2018-07-21: qty 100

## 2018-07-21 MED ORDER — POTASSIUM CHLORIDE ER 10 MEQ PO TBCR
20.0000 meq | EXTENDED_RELEASE_TABLET | Freq: Two times a day (BID) | ORAL | Status: DC
  Filled 2018-07-21 (×3): qty 2

## 2018-07-21 NOTE — Consult Note (Signed)
[facility] Consult  Reason for Consult: Left flank abscess/ decubitus ulcer  Referring Physician:  Dr. Cheryle Horsfall is a 58 y.o. female.  HPI: Ms. Dix is a 58 yo with hydrocephalus, mental handicap, contractures who is bed bound and cared for by her sister who is her POA, [name]. The patient has been getting pressure wounds on her left side, which is the side she prefers to lay on for the last several months. This wound has been getting larger and started to drain foul smelling fluid in the last few days. She was taken to the ED and evaluated there. There sister reports some minor breakdown of the patient's sacrum. She says that the try to turn the patient frequently at home but do not have a airbed.  The patient otherwise has reported to have chronic constipation and abdominal distention.  Dr. Moshe Cipro has been following the patient and monitoring the wounds but sent the patient to the ED once she noted the drainage and severity of the distention.   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 Long redundant colon. Bx  ?ischemic colitis   COLONOSCOPY  06/12/2011   Procedure: COLONOSCOPY;  Surgeon: Daneil Dolin, MD;  Location: [facility];  Service: Endoscopy;  Laterality: N/A;  11:00   FLEXIBLE SIGMOIDOSCOPY  08/14/2003   Rourk- Normal-appearing rectum, normal-appearing sigmoid colon to 50  cm.  Relatively poor prep made exam more difficult   patient reports that they had brain surgery attempted for hydrocephalus in infancy. However , the  procedure was aborted because she coded. .      Family History  Problem Relation Age of Onset   Kidney failure Mother        on dialysis   Hypertension Mother    Kidney disease Mother    Prostate cancer Father    Hypertension Father    Glaucoma Father    Colon cancer Sister 37   Multiple myeloma Sister    Hypertension Sister    Glaucoma Sister    Allergies Sister    Hypertension Sister    Glaucoma Sister    Hyperlipidemia Sister    Hypertension Brother    Allergies Brother    Hyperlipidemia Brother    Colon cancer Other        family history    Lymphoma Other        family history     Social History   Tobacco Use   Smoking status: Never Smoker   Smokeless tobacco: Never Used  Substance Use Topics   Alcohol use: No   Drug use: No    Medications:  I have reviewed the patient's current medications. Prior to Admission:  Medications Prior to Admission  Medication Sig Dispense Refill Last Dose   bisoprolol-hydrochlorothiazide (ZIAC) 5-6.25 MG tablet TAKE (1) TABLET BY MOUTH ONCE DAILY FOR BLOOD PRESSURE. (Patient taking differently: Take 1 tablet by mouth daily. ) 30 tablet 5 07/20/2018 at 930   docusate sodium (STOOL SOFTENER) 100 MG  capsule TAKE 1 CAPSULE TWICE DAILY FOR CONSTIPATION. (Patient taking differently: Take 100 mg by mouth 2 (two) times daily. ) 100 capsule 6 07/20/2018 at Unknown time   doxycycline (VIBRA-TABS) 100 MG tablet Take 1 tablet (100 mg total) by mouth 2 (two) times daily. 20 tablet 0 07/20/2018 at Unknown time   montelukast (SINGULAIR) 10 MG tablet Take 1 tablet (10 mg total) by mouth at bedtime. 90 tablet 3 07/19/2018 at Unknown time   pantoprazole (PROTONIX) 20 MG tablet Take 1 tablet (20 mg total) by mouth daily. 30 tablet 5 07/20/2018 at Unknown time   PHENobarbital (LUMINAL) 32.4 MG tablet TAKE ONE TABLET BY MOUTH TWICE DAILY FOR SEIZURE DISORDER. (Patient taking differently: Take 32.4 mg by mouth 2 (two) times daily. FOR  SEIZURE DISORDER.) 180 tablet 0 07/20/2018 at 930   polyethylene glycol powder (GLYCOLAX/MIRALAX) powder MIX 1 CAPFUL (17 G) IN 8 OUNCES OF WATER/JUICE AND DRINK ONCE DAILY FOR CONSTIPATION. (Patient taking differently: Take 17 g by mouth See admin instructions. MIX 1 CAPFUL (17 G) IN 8 OUNCES OF WATER/JUICE AND DRINK ONCE DAILY FOR CONSTIPATION.) 527 g 0 07/19/2018 at Unknown time   potassium chloride (K-DUR) 10 MEQ tablet TAKE (1) TABLET BY MOUTH TWICE DAILY WITH FOOD. (Patient taking differently: Take 10 mEq by mouth 2 (two) times daily with a meal. ) 60 tablet 11 07/20/2018 at Unknown time   simvastatin (ZOCOR) 10 MG tablet TAKE (1) TABLET BY MOUTH AT BEDTIME FOR CHOLESTEROL. (Patient taking differently: Take 10 mg by mouth at bedtime. ) 30 tablet 11 07/19/2018 at Unknown time   Vitamin D, Ergocalciferol, (DRISDOL) 1.25 MG (50000 UT) CAPS capsule TAKE 1 CAPSULE BY MOUTH EVERY 2 WEEKS. (Patient taking differently: Take 50,000 Units by mouth every 14 (fourteen) days. ) 6 capsule 11 07/17/2018 at Unknown time   Scheduled:  docusate sodium  100 mg Oral BID   enoxaparin (LOVENOX) injection  40 mg Subcutaneous Q24H   loratadine  10 mg Oral Daily   montelukast  10 mg Oral QHS   pantoprazole  40 mg Oral Daily   PHENobarbital  32.4 mg Oral BID   potassium chloride  40 mEq Oral TID   simvastatin  10 mg Oral QHS   sodium chloride flush  10-40 mL Intracatheter Q12H   Continuous:  lactated ringers     piperacillin-tazobactam (ZOSYN)  IV 3.375 g (07/21/18 1414)   vancomycin 750 mg (07/21/18 1411)   WJX:BJYNWGNFAOZHY, acetaminophen, polyethylene glycol powder, prochlorperazine, sodium chloride flush  No Known Allergies   ROS:  Review of systems not obtained due to patient factors.  None verbal Reports of left flank wound with drainage from sister Constipation at baseline Abdominal distention    Blood pressure 112/63, pulse 75, temperature 98.5 F (36.9 C), temperature source  Axillary, resp. rate 19, height '5\' 3"'  (1.6 m), weight 71.2 kg, SpO2 100 %. Physical Exam Vitals signs reviewed.  HENT:     Mouth/Throat:     Mouth: Mucous membranes are moist.  Eyes:     Pupils: Pupils are equal, round, and reactive to light.  Cardiovascular:     Rate and Rhythm: Normal rate.  Pulmonary:     Effort: Pulmonary effort is normal.  Abdominal:     General: There is distension.     Palpations: Abdomen is soft.     Tenderness: There is no abdominal tenderness.  Musculoskeletal:        General: No swelling.     Comments: contractured lower extremities, no edema;  sacrum with small stage 2 decubitus with minimal loss of skin, left ischium with stage 2 decubitus; left flank with purulent brown drainage from wet eschar, unroofed and stage 3 decubitus noted   Skin:    General: Skin is warm.  Neurological:     Mental Status: She is alert. Mental status is at baseline.   Left flank pre debridement:   Left flank post debridement:    Left ischium   Sacrum:      Results: Results for orders placed or performed during the hospital encounter of 07/20/18 (from the past 48 hour(s))  Wound or Superficial Culture     Status: None (Preliminary result)   Collection Time: 07/20/18  5:00 PM   Specimen: Skin, Cyst/Tag/Debridement; Wound  Result Value Ref Range   Specimen Description      SKIN BIOPSY Performed at [facility], [address] [place], [place] [address]    Special Requests      Normal Performed at [place] [place], [place] [address]    Gram Stain      FEW WBC PRESENT, PREDOMINANTLY PMN ABUNDANT GRAM POSITIVE COCCI ABUNDANT GRAM NEGATIVE RODS FEW GRAM POSITIVE RODS    Culture      FEW PROTEUS MIRABILIS CULTURE REINCUBATED FOR BETTER GROWTH Performed at [facility], [place] [address] [place], [place] [address]    Report Status PENDING   SARS Coronavirus 2 (CEPHEID - Performed in [place] hospital lab), Hosp Order     Status:  None   Collection Time: 07/20/18  5:02 PM   Specimen: Skin Biopsy; Nasopharyngeal  Result Value Ref Range   SARS Coronavirus 2 NEGATIVE NEGATIVE    Comment: (NOTE) If result is NEGATIVE SARS-CoV-2 target nucleic acids are NOT DETECTED. The SARS-CoV-2 RNA is generally detectable in upper and lower  respiratory specimens during the acute phase of infection. The lowest  concentration of SARS-CoV-2 viral copies this assay can detect is 250  copies / mL. A negative result does not preclude SARS-CoV-2 infection  and should not be used as the sole basis for treatment or other  patient management decisions.  A negative result may occur with  improper specimen collection / handling, submission of specimen other  than nasopharyngeal swab, presence of viral mutation(s) within the  areas targeted by this assay, and inadequate number of viral copies  (<250 copies / mL). A negative result must be combined with clinical  observations, patient history, and epidemiological information. If result is POSITIVE SARS-CoV-2 target nucleic acids are DETECTED. The SARS-CoV-2 RNA is generally detectable in upper and lower  respiratory specimens dur ing the acute phase of infection.  Positive  results are indicative of active infection with SARS-CoV-2.  Clinical  correlation with patient history and other diagnostic information is  necessary to determine patient infection status.  Positive results do  not rule out bacterial infection or co-infection with other viruses. If result is PRESUMPTIVE POSTIVE SARS-CoV-2 nucleic acids MAY BE PRESENT.   A presumptive positive result was obtained on the submitted specimen  and confirmed on repeat testing.  While 2019 novel coronavirus  (SARS-CoV-2) nucleic acids may be present in the submitted sample  additional confirmatory testing may be necessary for epidemiological  and / or clinical management purposes  to differentiate between  SARS-CoV-2 and other Sarbecovirus  currently known to infect humans.  If clinically indicated additional testing with an alternate test  methodology [phone]) is advised. The SARS-CoV-2 RNA is generally  detectable in  upper and lower respiratory sp ecimens during the acute  phase of infection. The expected result is Negative. Fact Sheet for Patients:  StrictlyIdeas.no Fact Sheet for Healthcare Providers: BankingDealers.co.za This test is not yet approved or cleared by the [place] FDA and has been authorized for detection and/or diagnosis of SARS-CoV-2 by FDA under an Emergency Use Authorization (EUA).  This EUA will remain in effect (meaning this test can be used) for the duration of the COVID-19 declaration under Section 564(b)(1) of the Act, 21 U.S.C. section 360bbb-3(b)(1), unless the authorization is terminated or revoked sooner. Performed at [facility], [address], [place], [place] [address]   CBC with Differential/Platelet     Status: Abnormal   Collection Time: 07/20/18  7:45 PM  Result Value Ref Range   WBC 19.2 (H) 4.0 - 10.5 K/uL   RBC 3.94 3.87 - 5.11 MIL/uL   Hemoglobin 11.2 (L) 12.0 - 15.0 g/dL   HCT 35.6 (L) 36.0 - 46.0 %   MCV 90.4 80.0 - 100.0 fL   MCH 28.4 26.0 - 34.0 pg   MCHC 31.5 30.0 - 36.0 g/dL   RDW 14.2 11.5 - 15.5 %   Platelets 132 (L) 150 - 400 K/uL    Comment: SPECIMEN CHECKED FOR CLOTS   nRBC 0.0 0.0 - 0.2 %   Neutrophils Relative % 77 %   Neutro Abs 14.6 (H) 1.7 - 7.7 K/uL   Lymphocytes Relative 12 %   Lymphs Abs 2.3 0.7 - 4.0 K/uL   Monocytes Relative 10 %   Monocytes Absolute 1.9 (H) 0.1 - 1.0 K/uL   Eosinophils Relative 0 %   Eosinophils Absolute 0.1 0.0 - 0.5 K/uL   Basophils Relative 0 %   Basophils Absolute 0.1 0.0 - 0.1 K/uL   Immature Granulocytes 1 %   Abs Immature Granulocytes 0.24 (H) 0.00 - 0.07 K/uL    Comment: Performed at [facility], [address], [place], [place] [address]  Comprehensive metabolic  panel     Status: Abnormal   Collection Time: 07/20/18  7:45 PM  Result Value Ref Range   Sodium 138 135 - 145 mmol/L   Potassium 2.5 (LL) 3.5 - 5.1 mmol/L    Comment: SLIGHT HEMOLYSIS CRITICAL RESULT CALLED TO, READ BACK BY AND VERIFIED WITH: TURNER,C ON 07/20/18 AT 2030 BY LOY,C    Chloride 95 (L) 98 - 111 mmol/L   CO2 28 22 - 32 mmol/L   Glucose, Bld 147 (H) 70 - 99 mg/dL   BUN 29 (H) 6 - 20 mg/dL   Creatinine, Ser 0.62 0.44 - 1.00 mg/dL   Calcium 8.6 (L) 8.9 - 10.3 mg/dL   Total Protein 6.0 (L) 6.5 - 8.1 g/dL   Albumin 2.2 (L) 3.5 - 5.0 g/dL   AST 18 15 - 41 U/L   ALT 16 0 - 44 U/L   Alkaline Phosphatase 74 38 - 126 U/L   Total Bilirubin 0.8 0.3 - 1.2 mg/dL   GFR calc non Af Amer >60 >60 mL/min   GFR calc Af Amer >60 >60 mL/min   Anion gap 15 5 - 15    Comment: Performed at [facility], [address], [place], [place] [address]  Lactic acid, plasma     Status: None   Collection Time: 07/20/18  7:45 PM  Result Value Ref Range   Lactic Acid, Venous 1.4 0.5 - 1.9 mmol/L    Comment: Performed at [facility], [address], [place], [place] [address]  Blood  culture (routine x 2)     Status: None (Preliminary result)   Collection Time: 07/20/18  7:45 PM   Specimen: BLOOD  Result Value Ref Range   Specimen Description BLOOD FEMORAL ARTERY THIGH    Special Requests      BOTTLES DRAWN AEROBIC AND ANAEROBIC Blood Culture adequate volume   Culture      NO GROWTH < 24 HOURS Performed at [facility], [address], [place], [place] [address]    Report Status PENDING   Blood culture (routine x 2)     Status: None (Preliminary result)   Collection Time: 07/20/18  9:40 PM   Specimen: BLOOD  Result Value Ref Range   Specimen Description BLOOD PIC LINE    Special Requests      BOTTLES DRAWN AEROBIC AND ANAEROBIC Blood Culture adequate volume   Culture      NO GROWTH < 12 HOURS Performed at [facility], [address], [place], [place] [address]    Report Status PENDING     Magnesium     Status: None   Collection Time: 07/20/18  9:40 PM  Result Value Ref Range   Magnesium 1.8 1.7 - 2.4 mg/dL    Comment: Performed at [facility], [address] [place], [place] [address]  Phosphorus     Status: None   Collection Time: 07/20/18  9:40 PM  Result Value Ref Range   Phosphorus 2.9 2.5 - 4.6 mg/dL    Comment: Performed at [facility], [address], [place], [place] [address]  CBC with Differential/Platelet     Status: Abnormal   Collection Time: 07/21/18  6:06 AM  Result Value Ref Range   WBC 20.5 (H) 4.0 - 10.5 K/uL   RBC 3.55 (L) 3.87 - 5.11 MIL/uL   Hemoglobin 10.3 (L) 12.0 - 15.0 g/dL   HCT 32.6 (L) 36.0 - 46.0 %   MCV 91.8 80.0 - 100.0 fL   MCH 29.0 26.0 - 34.0 pg   MCHC 31.6 30.0 - 36.0 g/dL   RDW 14.4 11.5 - 15.5 %   Platelets 292 150 - 400 K/uL    Comment: REPEATED TO VERIFY DELTA CHECK NOTED PLATELET COUNT CONFIRMED BY SMEAR SPECIMEN CHECKED FOR CLOTS    nRBC 0.0 0.0 - 0.2 %   Neutrophils Relative % 80 %   Neutro Abs 16.2 (H) 1.7 - 7.7 K/uL   Lymphocytes Relative 11 %   Lymphs Abs 2.3 0.7 - 4.0 K/uL   Monocytes Relative 8 %   Monocytes Absolute 1.7 (H) 0.1 - 1.0 K/uL   Eosinophils Relative 0 %   Eosinophils Absolute 0.1 0.0 - 0.5 K/uL   Basophils Relative 0 %   Basophils Absolute 0.1 0.0 - 0.1 K/uL   Immature Granulocytes 1 %   Abs Immature Granulocytes 0.19 (H) 0.00 - 0.07 K/uL    Comment: Performed at [facility], [address] [place], [place] [address]  Basic metabolic panel     Status: Abnormal   Collection Time: 07/21/18  6:44 AM  Result Value Ref Range   Sodium 140 135 - 145 mmol/L   Potassium 2.4 (LL) 3.5 - 5.1 mmol/L    Comment: CRITICAL RESULT CALLED TO, READ BACK BY AND VERIFIED WITH: JACKSON,N AT 7:15AM ON 07/21/18 BY FESTERMAN,C    Chloride 99 98 - 111 mmol/L   CO2 27 22 - 32 mmol/L   Glucose, Bld 172 (H) 70 - 99 mg/dL   BUN 25 (H) 6 - 20 mg/dL  Creatinine, Ser 0.68 0.44 - 1.00 mg/dL   Calcium 8.4 (L) 8.9 -  10.3 mg/dL   GFR calc non Af Amer >60 >60 mL/min   GFR calc Af Amer >60 >60 mL/min   Anion gap 14 5 - 15    Comment: Performed at [facility] [address], [place], [place] [address]  Lactic acid, plasma     Status: None   Collection Time: 07/21/18  7:52 AM  Result Value Ref Range   Lactic Acid, Venous 1.1 0.5 - 1.9 mmol/L    Comment: Performed at [facility], [address], [place], [place] [address]   Personally reviewed CT scan- abscess under left flank eschar noted, tracking inferiorly  Ct Abdomen Pelvis Wo Contrast  Result Date: 07/20/2018 CLINICAL DATA:  Abdomen distension EXAM: CT ABDOMEN AND PELVIS WITHOUT CONTRAST TECHNIQUE: Multidetector CT imaging of the abdomen and pelvis was performed following the standard protocol without IV contrast. COMPARISON:  Barium enema 05/14/2003 FINDINGS: Lower chest: Lung bases demonstrate partial atelectasis at the left base. Trace left pleural effusion. Heart size within normal limits. Hepatobiliary: No focal hepatic abnormality. Numerous calcified gallstones. No biliary dilatation. Pancreas: Unremarkable. No pancreatic ductal dilatation or surrounding inflammatory changes. Spleen: Normal in size without focal abnormality. Adrenals/Urinary Tract: Adrenal glands are normal. Kidneys show no hydronephrosis. Probable cyst upper pole left kidney. Urinary bladder is unremarkable. Stomach/Bowel: The stomach is nonenlarged. No dilated small bowel. Extremely tortuous and redundant colon. No colon wall thickening. Massive distension of the sigmoid colon up to 16 cm with fluid appearing stools in the rectum. Negative appendix. Vascular/Lymphatic: Nonaneurysmal aorta. No significantly enlarged lymph nodes. Reproductive: Enlarged uterus with suspected fundal mass measuring 7.8 cm. No definite adnexal mass. Other: No free air or free fluid. Musculoskeletal: Scoliosis and degenerative changes of the spine. Chronic appearing bony remodeling of the proximal left femur.  Skin thickening and irregular soft tissue density left lateral hip extending toward the femoral trochanter which may reflect decubitus ulcer. No acute gas or fluid in this region. Left upper back soft tissue wound or ulcer with subcutaneous edema and multiple foci of gas. This is contiguous with a large gas and fluid collection measuring at least 3.4 x 9.7 cm within the left flank subcutaneous soft tissues. No gross bony destructive change. IMPRESSION: 1. Anatomy somewhat distorted by patient's scoliosis. There is massive distention of the sigmoid colon which is contiguous with fluid filled rectum. No discrete colonic narrowing or evidence for volvulus. Findings suggest functional obstruction at the level of the rectosigmoid colon. Negative for perforation. 2. Large wound involving the skin and subcutaneous tissues of the left back and flank region with multiple foci of gas. Dominant fluid and gas collection in the left flank soft tissues measuring at least 9.7 cm, concerning for abscess or potential necrotic infection. Recommend correlation with direct inspection. 3. Numerous gallstones 4. Bulky uterus with suspected fibroids. Electronically Signed   By: Donavan Foil M.D.   On: 07/20/2018 21:23   [place] Ekg Site Rite  Result Date: 07/20/2018 If Site Rite image not attached, placement could not be confirmed due to current cardiac rhythm.  Consent obtained from [facility], sister/ [place].   Procedure:  Debridement and drainage of infected left flank decubitus wound 15X10 cm (undermining additional 7 cm inferior, 2 cm superior, 2cm medial, and 2cm lateral)  Pre- procedure Diagnosis: Unstageable left flank decubitus ulcer with abscess Post- procedure Diagnosis: Stage 3 left flank decubitus with abscess   Description: The patient was rolled to her  right.  Using sharp excisional debridement with scissors and a scalpel, the eschar was removed from the wound and all necrotic tissue was debrided down to the muscle.  Health  bleeding tissue was noted.  Copious amounts of necrotic tissue and purulence was removed. There was significant undermining of the wound.  The wound was irrigated with saline. Kerlix packing was placed. An ABD and tape secured the patient. The patient tolerated the procedure without issues.   Assessment & Plan:  AMY GOTHARD is a 58 y.o. female with a stage 3 left flank decubitus wound due to the patient's preference for the left side. She also has stage 2 sacral and left ischial wounds.   -Packing to start tonight to the left flank wound. Orders placed -Cultures obtained -Continue antibiotics for now -Will possibly need further debridement at the bedside, may ultimately benefit from a wound vacuum -Frequent turns and air bed of possible   All questions were answered to the satisfaction of the patient and family.   Virl Cagey 07/21/2018, 5:25 PM

## 2018-07-21 NOTE — Progress Notes (Signed)
Critical K 2.4     Texted Dr. Manuella Ghazi

## 2018-07-21 NOTE — Consult Note (Addendum)
[place] Nurse wound consult note Patient receiving care in [facility].  I have reviewed the record of this admission.  This consult was completed remotely. Reason for Consult: left flank wound Wound type: infectious Measurement: to be provided by bedside RN Wound bed: To be provided by bedside RN Drainage (amount, consistency, odor) See H & P note Periwound: See H & P note Dressing procedure/placement/frequency: Place as much dry gauze as necessary to fill the wound bed. Cover with ABD pads. Change each shift.  This wound and underlying condition needs a surgical evaluation.  According to the CT imaging report from 07/20/18, " Large wound involving the skin and subcutaneous tissues of the left back and flank region with multiple foci of gas. Dominant fluid and gas collection in the left flank soft tissues measuring at least 9.7 cm, concerning for abscess or potential necrotic infection."  Treatment of this wound exceeds the scope of practice of the [facility] nurse, and should be referred to a Psychologist, sport and exercise. A Secure Chat message to refer to a surgeon was sent to Dr. Eligah East. Val Riles, RN, MSN, CWOCN, CNS-BC, pager [phone]

## 2018-07-21 NOTE — Progress Notes (Signed)
Pharmacy Antibiotic Note  April Watson is a 58 y.o. female admitted on 07/20/2018 with cellulitis.  Pharmacy has been consulted for Zosyn and vancomycin dosing.  Plan: Start vancomycin 750mg  IV q12h Goal vancomycin trough range: 10-15 mcg/mL Zosyn 3.375g IV q8h (4 hour infusion). Pharmacy to monitor labs, cultures, vancomycin troughs as clinically appropriate, and patient progress.  Height: 5\' 3"  (160 cm) Weight: 156 lb 15.5 oz (71.2 kg) IBW/kg (Calculated) : 52.4  Temp (24hrs), Avg:98.5 F (36.9 C), Min:97.9 F (36.6 C), Max:99.3 F (37.4 C)  Recent Labs  Lab 07/20/18 1945 07/21/18 0606 07/21/18 0644 07/21/18 0752  WBC 19.2* 20.5*  --   --   CREATININE 0.62  --  0.68  --   LATICACIDVEN 1.4  --   --  1.1    Estimated Creatinine Clearance: 72.5 mL/min (by C-G formula based on SCr of 0.68 mg/dL).    No Known Allergies  Antimicrobials this admission: 7/1 vancomycin >>  7/1 zosyn >>   Microbiology results: 7/1 [facility] x2: pending 7/1 Wound Cx:  GS= few GPR, GPC, 7/1 Covid 19 negative  Thank you for allowing pharmacy to be a part of this patient's care.  Despina Pole 07/21/2018 8:32 AM

## 2018-07-21 NOTE — Consult Note (Deleted)
[place] Nurse wound consult note Reason for Consult: Consult requested for pressure injury. Reviewed photos and progress notes in the EMR; unstageable pressure injury is extensive and 100% necrotic with large amt foul odor and tan drainage.  Wound could benefit from debridement; Surgery consult is pending, according to progress notes.  Please refer to their team for further plan of care. Please re-consult if further assistance is needed.  Thank-you,  Julien Girt MSN, [place], [place], [place], [place]

## 2018-07-21 NOTE — Progress Notes (Signed)
Wound to left flank dressed with abd pads and tape after debridement of wound by Dr. Constance Haw.  Patient sounds congested but and sister said that she gives her allegra at home for this.  Contacted Dr. Manuella Ghazi and he ordered Claritin.  Sister has been at bedside all day

## 2018-07-21 NOTE — Progress Notes (Signed)
PROGRESS NOTE    MERLYN WIKER  UJW:[id number] DOB: 1960/11/20 DOA: 07/20/2018 PCP: Kerri Perches, MD   Brief Narrative:  Per HPI: April Watson is a 58 y.o. female with medical history significant of history of high-grade rectum dysplasia, chronic constipation, congenital hydrocephalus, MR, GERD, hyperlipidemia, hypertension, seizure disorder who was sent by Dr. Lodema Hong for evaluation of abdominal distention for several days and progressively worse left flank/lower back wound-started very small back in March, but now has become a lot more extensive and has been draining malodorous fluid since about a week.  No fever, chills, nausea or vomiting per sister.  She has chronic constipation, but has been having a small bowel movements.  She has been following with wound care.  Patient was admitted with sepsis secondary to cellulitis and multiple draining wounds to her back along with abdominal distention with suspected ileus secondary to chronic constipation and electrolyte abnormalities with hypokalemia.  Assessment & Plan:   Principal Problem:   Sepsis due to cellulitis [facility]) Active Problems:   Hyperlipidemia LDL goal <100   Essential hypertension   GERD   Chronic constipation   Type 2 diabetes mellitus with neurological complications (HCC)   Quadriplegia and quadriparesis (HCC)   Abdominal distension   Seizure disorder (HCC)   Sepsis due to cellulitis with draining wounds Patient noted to have gram-negative rods and gram-positive cocci on Gram stain of draining fluid Continue vancomycin and Zosyn empirically Appreciate general surgery with I&D planned at bedside today Continue to follow cultures Repeat a.m. CBC and lactic acid  Active Problems:   Abdominal distension-soft Secondary to chronic constipation with electrolyte abnormalities. Correct hypokalemia and supplement magnesium. Continue IV hydration Surgery will be evaluating in a.m. Advance diet slowly after I&D at  bedside    Chronic constipation As above. Increase potassium supplementation. Begin mag oxide daily.    Hyperlipidemia LDL goal <100 Continue simvastatin.    Essential hypertension Hold blood pressure agents due to soft blood pressure readings noted Increase potassium supplementation. Start magnesium oxide supplementation.    GERD Continue Protonix.    Type 2 diabetes mellitus with neurological complications (HCC) Diet controlled. CBG monitoring regular insulin sliding scale.    Quadriplegia and quadriparesis with congenital hydrocephalus Supportive care.    Seizure disorder (HCC) Continue phenobarbital 32.4 p.o. twice daily.   DVT prophylaxis: Lovenox Code Status: Full Family Communication: Sister at bedside Disposition Plan: I&D at bedside per general surgery and continue on IV antibiotics with cultures pending.  Will obtain PT evaluation by a.m. with possible need for SNF.  Continue IV potassium supplementation and recheck labs.   Consultants:   General surgery  Procedures:   None  Antimicrobials:   Vancomycin and Zosyn 7/1->   Subjective: Patient seen and evaluated today and appears to be comfortable.  Sister at bedside is a primary historian.  No acute overnight events since admission noted.  Objective: Vitals:   07/20/18 2200 07/20/18 2300 07/21/18 0532 07/21/18 0700  BP: 111/72 112/61 (!) 96/54   Pulse: 81 80 78   Resp: (!) 25 17 18    Temp:  97.9 F (36.6 C) 98.4 F (36.9 C)   TempSrc:  Oral    SpO2: 99% 100% 99%   Weight:    71.2 kg  Height:    5\' 3"  (1.6 m)    Intake/Output Summary (Last 24 hours) at 07/21/2018 1030 Last data filed at 07/21/2018 0600 Gross per 24 hour  Intake -  Output 150 ml  Net -150 ml  Filed Weights   07/20/18 1653 07/21/18 0700  Weight: 72.6 kg 71.2 kg    Examination:  General exam: Appears calm and comfortable  Respiratory system: Clear to auscultation. Respiratory effort normal. Cardiovascular  system: S1 & S2 heard, RRR. No JVD, murmurs, rubs, gallops or clicks. No pedal edema. Gastrointestinal system: Abdomen is distended, soft and nontender. No organomegaly or masses felt. Normal bowel sounds heard. Central nervous system: Alert and awake Extremities: Symmetric 5 x 5 power. Skin: No rashes, lesions or ulcers, large wound with necrotic change noted to left flank approximately 10 cm diameter. Psychiatry: Cannot be appropriately assessed    Data Reviewed: I have personally reviewed following labs and imaging studies  CBC: Recent Labs  Lab 07/20/18 1945 07/21/18 0606  WBC 19.2* 20.5*  NEUTROABS 14.6* 16.2*  HGB 11.2* 10.3*  HCT 35.6* 32.6*  MCV 90.4 91.8  PLT 132* 292   Basic Metabolic Panel: Recent Labs  Lab 07/20/18 1945 07/20/18 2140 07/21/18 0644  NA 138  --  140  K 2.5*  --  2.4*  CL 95*  --  99  CO2 28  --  27  GLUCOSE 147*  --  172*  BUN 29*  --  25*  CREATININE 0.62  --  0.68  CALCIUM 8.6*  --  8.4*  MG  --  1.8  --   PHOS  --  2.9  --    GFR: Estimated Creatinine Clearance: 72.5 mL/min (by C-G formula based on SCr of 0.68 mg/dL). Liver Function Tests: Recent Labs  Lab 07/20/18 1945  AST 18  ALT 16  ALKPHOS 74  BILITOT 0.8  PROT 6.0*  ALBUMIN 2.2*   No results for input(s): LIPASE, AMYLASE in the last 168 hours. No results for input(s): AMMONIA in the last 168 hours. Coagulation Profile: No results for input(s): INR, PROTIME in the last 168 hours. Cardiac Enzymes: No results for input(s): CKTOTAL, CKMB, CKMBINDEX, TROPONINI in the last 168 hours. BNP (last 3 results) No results for input(s): PROBNP in the last 8760 hours. HbA1C: No results for input(s): HGBA1C in the last 72 hours. CBG: No results for input(s): GLUCAP in the last 168 hours. Lipid Profile: No results for input(s): CHOL, HDL, LDLCALC, TRIG, CHOLHDL, LDLDIRECT in the last 72 hours. Thyroid Function Tests: No results for input(s): TSH, T4TOTAL, FREET4, T3FREE, THYROIDAB  in the last 72 hours. Anemia Panel: No results for input(s): VITAMINB12, FOLATE, FERRITIN, TIBC, IRON, RETICCTPCT in the last 72 hours. Sepsis Labs: Recent Labs  Lab 07/20/18 1945 07/21/18 0752  LATICACIDVEN 1.4 1.1    Recent Results (from the past 240 hour(s))  Wound or Superficial Culture     Status: None (Preliminary result)   Collection Time: 07/20/18  5:00 PM   Specimen: Skin, Cyst/Tag/Debridement; Wound  Result Value Ref Range Status   Specimen Description   Final    SKIN BIOPSY Performed at [facility], [address], [place], [place] [address]    Special Requests   Final    Normal Performed at [facility], [address] [place], [place] [address]    Gram Stain   Final    FEW WBC PRESENT, PREDOMINANTLY PMN ABUNDANT GRAM POSITIVE COCCI ABUNDANT GRAM NEGATIVE RODS FEW GRAM POSITIVE RODS Performed at [facility], [address] [place], [place] [address]    Culture PENDING  Incomplete   Report Status PENDING  Incomplete  SARS Coronavirus 2 (CEPHEID - Performed in [facility] hospital lab), [facility]  Status: None   Collection Time: 07/20/18  5:02 PM   Specimen: Skin Biopsy; Nasopharyngeal  Result Value Ref Range Status   SARS Coronavirus 2 NEGATIVE NEGATIVE Final    Comment: (NOTE) If result is NEGATIVE SARS-CoV-2 target nucleic acids are NOT DETECTED. The SARS-CoV-2 RNA is generally detectable in upper and lower  respiratory specimens during the acute phase of infection. The lowest  concentration of SARS-CoV-2 viral copies this assay can detect is 250  copies / mL. A negative result does not preclude SARS-CoV-2 infection  and should not be used as the sole basis for treatment or other  patient management decisions.  A negative result may occur with  improper specimen collection / handling, submission of specimen other  than nasopharyngeal swab, presence of viral mutation(s) within the  areas targeted by this assay, and inadequate number of viral  copies  (<250 copies / mL). A negative result must be combined with clinical  observations, patient history, and epidemiological information. If result is POSITIVE SARS-CoV-2 target nucleic acids are DETECTED. The SARS-CoV-2 RNA is generally detectable in upper and lower  respiratory specimens dur ing the acute phase of infection.  Positive  results are indicative of active infection with SARS-CoV-2.  Clinical  correlation with patient history and other diagnostic information is  necessary to determine patient infection status.  Positive results do  not rule out bacterial infection or co-infection with other viruses. If result is PRESUMPTIVE POSTIVE SARS-CoV-2 nucleic acids MAY BE PRESENT.   A presumptive positive result was obtained on the submitted specimen  and confirmed on repeat testing.  While 2019 novel coronavirus  (SARS-CoV-2) nucleic acids may be present in the submitted sample  additional confirmatory testing may be necessary for epidemiological  and / or clinical management purposes  to differentiate between  SARS-CoV-2 and other Sarbecovirus currently known to infect humans.  If clinically indicated additional testing with an alternate test  methodology [phone]) is advised. The SARS-CoV-2 RNA is generally  detectable in upper and lower respiratory sp ecimens during the acute  phase of infection. The expected result is Negative. Fact Sheet for Patients:  BoilerBrush.com.cy Fact Sheet for Healthcare Providers: https://pope.com/ This test is not yet approved or cleared by the [place] FDA and has been authorized for detection and/or diagnosis of SARS-CoV-2 by FDA under an Emergency Use Authorization (EUA).  This EUA will remain in effect (meaning this test can be used) for the duration of the COVID-19 declaration under Section 564(b)(1) of the Act, 21 U.S.C. section 360bbb-3(b)(1), unless the authorization is terminated  or revoked sooner. Performed at [facility], [address] [place], [place] [address]   Blood culture (routine x 2)     Status: None (Preliminary result)   Collection Time: 07/20/18  9:40 PM   Specimen: BLOOD  Result Value Ref Range Status   Specimen Description BLOOD PIC LINE  Final   Special Requests   Final    BOTTLES DRAWN AEROBIC AND ANAEROBIC Blood Culture adequate volume Performed at [facility], [address], [place], [place] [address]    Culture PENDING  Incomplete   Report Status PENDING  Incomplete         Radiology Studies: Ct Abdomen Pelvis Wo Contrast  Result Date: 07/20/2018 CLINICAL DATA:  Abdomen distension EXAM: CT ABDOMEN AND PELVIS WITHOUT CONTRAST TECHNIQUE: Multidetector CT imaging of the abdomen and pelvis was performed following the standard protocol without IV contrast. COMPARISON:  Barium enema 05/14/2003 FINDINGS: Lower chest: Lung bases demonstrate partial  atelectasis at the left base. Trace left pleural effusion. Heart size within normal limits. Hepatobiliary: No focal hepatic abnormality. Numerous calcified gallstones. No biliary dilatation. Pancreas: Unremarkable. No pancreatic ductal dilatation or surrounding inflammatory changes. Spleen: Normal in size without focal abnormality. Adrenals/Urinary Tract: Adrenal glands are normal. Kidneys show no hydronephrosis. Probable cyst upper pole left kidney. Urinary bladder is unremarkable. Stomach/Bowel: The stomach is nonenlarged. No dilated small bowel. Extremely tortuous and redundant colon. No colon wall thickening. Massive distension of the sigmoid colon up to 16 cm with fluid appearing stools in the rectum. Negative appendix. Vascular/Lymphatic: Nonaneurysmal aorta. No significantly enlarged lymph nodes. Reproductive: Enlarged uterus with suspected fundal mass measuring 7.8 cm. No definite adnexal mass. Other: No free air or free fluid. Musculoskeletal: Scoliosis and degenerative changes of the spine. Chronic  appearing bony remodeling of the proximal left femur. Skin thickening and irregular soft tissue density left lateral hip extending toward the femoral trochanter which may reflect decubitus ulcer. No acute gas or fluid in this region. Left upper back soft tissue wound or ulcer with subcutaneous edema and multiple foci of gas. This is contiguous with a large gas and fluid collection measuring at least 3.4 x 9.7 cm within the left flank subcutaneous soft tissues. No gross bony destructive change. IMPRESSION: 1. Anatomy somewhat distorted by patient's scoliosis. There is massive distention of the sigmoid colon which is contiguous with fluid filled rectum. No discrete colonic narrowing or evidence for volvulus. Findings suggest functional obstruction at the level of the rectosigmoid colon. Negative for perforation. 2. Large wound involving the skin and subcutaneous tissues of the left back and flank region with multiple foci of gas. Dominant fluid and gas collection in the left flank soft tissues measuring at least 9.7 cm, concerning for abscess or potential necrotic infection. Recommend correlation with direct inspection. 3. Numerous gallstones 4. Bulky uterus with suspected fibroids. Electronically Signed   By: Jasmine Pang M.D.   On: 07/20/2018 21:23   [place] Ekg Site Rite  Result Date: 07/20/2018 If Site Rite image not attached, placement could not be confirmed due to current cardiac rhythm.       Scheduled Meds: . docusate sodium  100 mg Oral BID  . enoxaparin (LOVENOX) injection  40 mg Subcutaneous Q24H  . montelukast  10 mg Oral QHS  . pantoprazole  20 mg Oral Daily  . PHENobarbital  32.4 mg Oral BID  . potassium chloride  40 mEq Oral TID  . simvastatin  10 mg Oral QHS  . sodium chloride flush  10-40 mL Intracatheter Q12H   Continuous Infusions: . piperacillin-tazobactam (ZOSYN)  IV 3.375 g (07/21/18 0721)  . potassium chloride 10 mEq (07/21/18 0928)  . vancomycin       LOS: 1 day    Time  spent: 30 minutes    Keene Gilkey Hoover Brunette, DO [facility] Pager [phone]  If 7PM-7AM, please contact night-coverage [email or web address] Password [email or web address] 07/21/2018, 10:30 AM

## 2018-07-22 ENCOUNTER — Encounter (HOSPITAL_COMMUNITY): Payer: Self-pay | Admitting: Radiology

## 2018-07-22 ENCOUNTER — Inpatient Hospital Stay (HOSPITAL_COMMUNITY): Payer: Medicare Other

## 2018-07-22 ENCOUNTER — Other Ambulatory Visit: Payer: Self-pay

## 2018-07-22 LAB — CBC
HCT: 32.6 % — ABNORMAL LOW (ref 36.0–46.0)
Hemoglobin: 10 g/dL — ABNORMAL LOW (ref 12.0–15.0)
MCH: 28.9 pg (ref 26.0–34.0)
MCHC: 30.7 g/dL (ref 30.0–36.0)
MCV: 94.2 fL (ref 80.0–100.0)
Platelets: 347 10*3/uL (ref 150–400)
RBC: 3.46 MIL/uL — ABNORMAL LOW (ref 3.87–5.11)
RDW: 14.5 % (ref 11.5–15.5)
WBC: 18 10*3/uL — ABNORMAL HIGH (ref 4.0–10.5)
nRBC: 0 % (ref 0.0–0.2)

## 2018-07-22 LAB — BASIC METABOLIC PANEL
Anion gap: 6 (ref 5–15)
BUN: 21 mg/dL — ABNORMAL HIGH (ref 6–20)
CO2: 26 mmol/L (ref 22–32)
Calcium: 8.2 mg/dL — ABNORMAL LOW (ref 8.9–10.3)
Chloride: 105 mmol/L (ref 98–111)
Creatinine, Ser: 0.6 mg/dL (ref 0.44–1.00)
GFR calc Af Amer: >60 mL/min (ref >60)
GFR calc non Af Amer: >60 mL/min (ref >60)
Glucose, Bld: 132 mg/dL — ABNORMAL HIGH (ref 70–99)
Potassium: 3.8 mmol/L (ref 3.5–5.1)
Sodium: 137 mmol/L (ref 135–145)

## 2018-07-22 LAB — MAGNESIUM: Magnesium: 2 mg/dL (ref 1.7–2.4)

## 2018-07-22 LAB — LACTIC ACID, PLASMA: Lactic Acid, Venous: 1 mmol/L (ref 0.5–1.9)

## 2018-07-22 MED ORDER — JUVEN PO PACK
1.0000 | PACK | Freq: Two times a day (BID) | ORAL | Status: DC
  Administered 2018-07-22 – 2018-07-27 (×11): 1 via ORAL
  Filled 2018-07-22 (×12): qty 1

## 2018-07-22 MED ORDER — DM-GUAIFENESIN ER 30-600 MG PO TB12
1.0000 | ORAL_TABLET | Freq: Two times a day (BID) | ORAL | Status: DC
  Administered 2018-07-22 – 2018-07-27 (×11): 1 via ORAL
  Filled 2018-07-22 (×11): qty 1

## 2018-07-22 MED ORDER — ENSURE MAX PROTEIN PO LIQD
11.0000 [oz_av] | Freq: Two times a day (BID) | ORAL | Status: DC
  Administered 2018-07-22 – 2018-07-27 (×11): 11 [oz_av] via ORAL

## 2018-07-22 MED ORDER — POTASSIUM CHLORIDE CRYS ER 20 MEQ PO TBCR
40.0000 meq | EXTENDED_RELEASE_TABLET | Freq: Once | ORAL | Status: AC
  Administered 2018-07-22: 40 meq via ORAL
  Filled 2018-07-22: qty 2

## 2018-07-22 NOTE — Progress Notes (Signed)
Initial Nutrition Assessment  RD working remotely.   DOCUMENTATION CODES:      INTERVENTION:  -1 packet Juven BID, each packet provides 95 calories, 2.5 grams of protein (collagen), and 9.8 grams of carbohydrate (3 grams sugar); also contains 7 grams of L-arginine and L-glutamine, 300 mg vitamin C, 15 mg vitamin E, 1.2 mcg vitamin B-12, 9.5 mg zinc, 200 mg calcium, and 1.5 g  Calcium Beta-hydroxy-Beta-methylbutyrate to support wound healing   -Ensure Max BID daily : provides 150 kcal, 30 gr protein each 11 oz  NUTRITION DIAGNOSIS:   Increased nutrient needs related to wound healing as evidenced by estimated needs.   GOAL:   Patient will meet greater than or equal to 90% of their needs  MONITOR:   PO intake, Supplement acceptance, Labs, Weight trends  REASON FOR ASSESSMENT:   Low [place]aden    ASSESSMENT: Patient is a bed bound, mentally handicapped, contracted right arm 58 yo female who is cared for by her sister. PMH: hydrocephalus, chronic constipation, GERD, hypertension and has multiple areas of compromised skin integrity. Septic on admission due to her wound left flank with purulent brown drainage (stage 3). S/p debridement 7/2.  Patient ate 50% of her breakfast this morning. Fed by sister. Will monitor intake for trends. Insufficient po's currently to meet increased energy and protein needs for wound healing. Talked with RN regarding nutrition care plan.  Limited wt history. Reviewed chart.    Labs: BMP Latest Ref Rng & Units 07/22/2018 07/21/2018 07/20/2018  Glucose 70 - 99 mg/dL 132(H) 172(H) 147(H)  BUN 6 - 20 mg/dL 21(H) 25(H) 29(H)  Creatinine 0.44 - 1.00 mg/dL 0.60 0.68 0.62  BUN/Creat Ratio 6 - 22 (calc) - - -  Sodium 135 - 145 mmol/L 137 140 138  Potassium 3.5 - 5.1 mmol/L 3.8 2.4(LL) 2.5(LL)  Chloride 98 - 111 mmol/L 105 99 95(L)  CO2 22 - 32 mmol/L 26 27 28   Calcium 8.9 - 10.3 mg/dL 8.2(L) 8.4(L) 8.6(L)     NUTRITION - FOCUSED PHYSICAL EXAM:  Unable to  complete Nutrition-Focused physical exam at this time.     Diet Order:   Diet Order            Diet Heart Room service appropriate? Yes; Fluid consistency: Thin  Diet effective now              EDUCATION NEEDS:   Not appropriate for education at this time Skin:  Skin Assessment: Skin Integrity Issues: Skin Integrity Issues:: Other (Comment) Stage II: medial sacrum Other: MASD Non pressure wound to left flank noted above. ischium wound   Last BM:  7/1  Height:   Ht Readings from Last 1 Encounters:  07/21/18 5\' 3"  (1.6 m)    Weight:   Wt Readings from Last 1 Encounters:  07/21/18 71.2 kg    Ideal Body Weight:  52 kg  BMI:  Body mass index is 27.81 kg/m.  Estimated Nutritional Needs:   Kcal:  7408-1448  Protein:  88-99 gr  Fluid:  >1500 ml daily   Colman Cater MS,RD,CSG,LDN Office: [phone] Pager: [phone]

## 2018-07-22 NOTE — Progress Notes (Addendum)
PROGRESS NOTE    April Watson  MVH:[id number] DOB: 1960-09-20 DOA: 07/20/2018 PCP: Kerri Perches, MD   Brief Narrative:  Per HPI: April Watson a 58 y.o.femalewith medical history significant ofhistory of high-grade rectum dysplasia, chronic constipation, congenital hydrocephalus, MR, GERD, hyperlipidemia, hypertension, seizure disorder who was sent by Dr.Simpsonfor evaluation of abdominal distention for several daysandprogressively worseleft flank/lower backwound-started very small back in March, but now has become a lot more extensive and has been draining malodorous fluid since about a week.No fever, chills, nausea or vomiting per sister. She has chronic constipation, but has been having a small bowel movements. She has been following with wound care.  Patient was admitted with sepsis secondary to cellulitis and multiple draining wounds to her back along with abdominal distention with suspected ileus secondary to chronic constipation and electrolyte abnormalities with hypokalemia.  She has undergone debridement and drainage of her infected left flank decubitus wound with abscess on 7/2 and remains on vancomycin and Zosyn.  She has been seen by physical therapy with recommendations for SNF placement on discharge, the family would like to go home with home health when ready.  Assessment & Plan:   Principal Problem:   Sepsis due to cellulitis [facility]) Active Problems:   Hyperlipidemia LDL goal <100   Essential hypertension   GERD   Chronic constipation   Type 2 diabetes mellitus with neurological complications (HCC)   Decubitus ulcer of sacral region, stage 2 (HCC)   Quadriplegia and quadriparesis (HCC)   Abdominal distension   Seizure disorder (HCC)   Decubitus ulcer of back, stage 3 (HCC)   Left ischial pressure sore, stage II (HCC)   Abscess   Sepsis due to cellulitis with draining wounds Patient noted to have gram-negative rods and gram-positive cocci on  Gram stain of draining fluid-need further cartilage for ID and sensitivities Continue vancomycin and Zosyn empirically Blood cultures with no growth noted in last 2 days Repeat a.m. CBC ; lactic acid of 1 noted yesterday and will not repeat  Active Problems: Abdominal distension-soft Secondary to chronic constipation with electrolyte abnormalities. Potassium to be administered orally for 1 dose today Magnesium within normal limits Appears to be tolerating diet Discontinue IV fluid  Cough Will obtain chest x-ray to evaluate Mucinex ordered Loratadine started  Chronic constipation As above. Potassium almost repleted magnesium is 2  Hyperlipidemia LDL goal <100 Continue simvastatin.  Essential hypertension Hold blood pressure agents for now until blood pressure elevates  GERD Continue Protonix.  Type 2 diabetes mellitus with neurological complications (HCC) Diet controlled. CBG monitoring regular insulin sliding scale.  Quadriplegia and quadriparesis with congenital hydrocephalus Supportive care.  Seizure disorder (HCC) Continue phenobarbital 32.4 p.o. twice daily.   DVT prophylaxis: Lovenox Code Status: Full Family Communication: Sister at bedside Disposition Plan:  Appreciate general surgery for wound debridement with further debridement likely planned for 7/4.  Continue on IV antibiotics.  She has been recommended SNF placement on discharge, but family would like to take her home with home health.  Consultants:   General surgery  Procedures:   7/2 debridement and drainage of left flank stage III decubitus wound  Antimicrobials:   Vancomycin and Zosyn 7/1->   Subjective: Patient seen and evaluated today with no new acute complaints or concerns. No acute concerns or events noted overnight.  She remains on IV antibiotics with downtrending leukocytosis noted.  Sister is currently at bedside.  Objective: Vitals:   07/21/18 1337  07/21/18 2149 07/22/18 0600 07/22/18 0732  BP:  112/63 129/77 115/80   Pulse: 75 79 80   Resp: 19 18 (!) 22   Temp: 98.5 F (36.9 C) 98.5 F (36.9 C)    TempSrc: Axillary Oral    SpO2: 100% 99% 93% 94%  Weight:      Height:        Intake/Output Summary (Last 24 hours) at 07/22/2018 1123 Last data filed at 07/22/2018 0851 Gross per 24 hour  Intake 1067.12 ml  Output 400 ml  Net 667.12 ml   Filed Weights   07/20/18 1653 07/21/18 0700  Weight: 72.6 kg 71.2 kg    Examination:  General exam: Appears calm and comfortable  Respiratory system: Clear to auscultation. Respiratory effort normal. Cardiovascular system: S1 & S2 heard, RRR. No JVD, murmurs, rubs, gallops or clicks. No pedal edema. Gastrointestinal system: Abdomen is minimally distended, soft and nontender. No organomegaly or masses felt. Normal bowel sounds heard. Central nervous system: Alert and awake. Extremities: Symmetric 5 x 5 power. Skin: Did not examine backside and will await further general surgery evaluation Psychiatry: Cannot be evaluated.    Data Reviewed: I have personally reviewed following labs and imaging studies  CBC: Recent Labs  Lab 07/20/18 1945 07/21/18 0606 07/22/18 0453  WBC 19.2* 20.5* 18.0*  NEUTROABS 14.6* 16.2*  --   HGB 11.2* 10.3* 10.0*  HCT 35.6* 32.6* 32.6*  MCV 90.4 91.8 94.2  PLT 132* 292 347   Basic Metabolic Panel: Recent Labs  Lab 07/20/18 1945 07/20/18 2140 07/21/18 0644 07/22/18 0453  NA 138  --  140 137  K 2.5*  --  2.4* 3.8  CL 95*  --  99 105  CO2 28  --  27 26  GLUCOSE 147*  --  172* 132*  BUN 29*  --  25* 21*  CREATININE 0.62  --  0.68 0.60  CALCIUM 8.6*  --  8.4* 8.2*  MG  --  1.8  --  2.0  PHOS  --  2.9  --   --    GFR: Estimated Creatinine Clearance: 72.5 mL/min (by C-G formula based on SCr of 0.6 mg/dL). Liver Function Tests: Recent Labs  Lab 07/20/18 1945  AST 18  ALT 16  ALKPHOS 74  BILITOT 0.8  PROT 6.0*  ALBUMIN 2.2*   No results for  input(s): LIPASE, AMYLASE in the last 168 hours. No results for input(s): AMMONIA in the last 168 hours. Coagulation Profile: No results for input(s): INR, PROTIME in the last 168 hours. Cardiac Enzymes: No results for input(s): CKTOTAL, CKMB, CKMBINDEX, TROPONINI in the last 168 hours. BNP (last 3 results) No results for input(s): PROBNP in the last 8760 hours. HbA1C: No results for input(s): HGBA1C in the last 72 hours. CBG: No results for input(s): GLUCAP in the last 168 hours. Lipid Profile: No results for input(s): CHOL, HDL, LDLCALC, TRIG, CHOLHDL, LDLDIRECT in the last 72 hours. Thyroid Function Tests: No results for input(s): TSH, T4TOTAL, FREET4, T3FREE, THYROIDAB in the last 72 hours. Anemia Panel: No results for input(s): VITAMINB12, FOLATE, FERRITIN, TIBC, IRON, RETICCTPCT in the last 72 hours. Sepsis Labs: Recent Labs  Lab 07/20/18 1945 07/21/18 0752 07/22/18 0453  LATICACIDVEN 1.4 1.1 1.0    Recent Results (from the past 240 hour(s))  Wound or Superficial Culture     Status: None (Preliminary result)   Collection Time: 07/20/18  5:00 PM   Specimen: Skin, Cyst/Tag/Debridement; Wound  Result Value Ref Range Status   Specimen Description SKIN BIOPSY  Final   Special  Requests Normal  Final   Gram Stain   Final    FEW WBC PRESENT, PREDOMINANTLY PMN ABUNDANT GRAM POSITIVE COCCI ABUNDANT GRAM NEGATIVE RODS FEW GRAM POSITIVE RODS    Culture   Final    FEW PROTEUS MIRABILIS CULTURE REINCUBATED FOR BETTER GROWTH    Report Status PENDING  Incomplete   Organism ID, Bacteria PROTEUS MIRABILIS  Final      Susceptibility   Proteus mirabilis - MIC*    AMPICILLIN <=2 SENSITIVE Sensitive     CEFAZOLIN <=4 SENSITIVE Sensitive     CEFEPIME <=1 SENSITIVE Sensitive     CEFTAZIDIME <=1 SENSITIVE Sensitive     CEFTRIAXONE <=1 SENSITIVE Sensitive     CIPROFLOXACIN <=0.25 SENSITIVE Sensitive     GENTAMICIN <=1 SENSITIVE Sensitive     IMIPENEM 8 INTERMEDIATE Intermediate      TRIMETH/SULFA <=20 SENSITIVE Sensitive     AMPICILLIN/SULBACTAM <=2 SENSITIVE Sensitive     PIP/TAZO Value in next row Sensitive      <=4 SENSITIVEPerformed at [facility], [address], [place], [place] [address]    * FEW PROTEUS MIRABILIS  SARS Coronavirus 2 (CEPHEID - Performed in [facility] hospital lab), Hosp Order     Status: None   Collection Time: 07/20/18  5:02 PM   Specimen: Skin Biopsy; Nasopharyngeal  Result Value Ref Range Status   SARS Coronavirus 2 NEGATIVE NEGATIVE Final    Comment: (NOTE) If result is NEGATIVE SARS-CoV-2 target nucleic acids are NOT DETECTED. The SARS-CoV-2 RNA is generally detectable in upper and lower  respiratory specimens during the acute phase of infection. The lowest  concentration of SARS-CoV-2 viral copies this assay can detect is 250  copies / mL. A negative result does not preclude SARS-CoV-2 infection  and should not be used as the sole basis for treatment or other  patient management decisions.  A negative result may occur with  improper specimen collection / handling, submission of specimen other  than nasopharyngeal swab, presence of viral mutation(s) within the  areas targeted by this assay, and inadequate number of viral copies  (<250 copies / mL). A negative result must be combined with clinical  observations, patient history, and epidemiological information. If result is POSITIVE SARS-CoV-2 target nucleic acids are DETECTED. The SARS-CoV-2 RNA is generally detectable in upper and lower  respiratory specimens dur ing the acute phase of infection.  Positive  results are indicative of active infection with SARS-CoV-2.  Clinical  correlation with patient history and other diagnostic information is  necessary to determine patient infection status.  Positive results do  not rule out bacterial infection or co-infection with other viruses. If result is PRESUMPTIVE POSTIVE SARS-CoV-2 nucleic acids MAY BE PRESENT.   A presumptive  positive result was obtained on the submitted specimen  and confirmed on repeat testing.  While 2019 novel coronavirus  (SARS-CoV-2) nucleic acids may be present in the submitted sample  additional confirmatory testing may be necessary for epidemiological  and / or clinical management purposes  to differentiate between  SARS-CoV-2 and other Sarbecovirus currently known to infect humans.  If clinically indicated additional testing with an alternate test  methodology [phone]) is advised. The SARS-CoV-2 RNA is generally  detectable in upper and lower respiratory sp ecimens during the acute  phase of infection. The expected result is Negative. Fact Sheet for Patients:  BoilerBrush.com.cy Fact Sheet for Healthcare Providers: https://pope.com/ This test is not yet approved or cleared by the [place] FDA and has been  authorized for detection and/or diagnosis of SARS-CoV-2 by FDA under an Emergency Use Authorization (EUA).  This EUA will remain in effect (meaning this test can be used) for the duration of the COVID-19 declaration under Section 564(b)(1) of the Act, 21 U.S.C. section 360bbb-3(b)(1), unless the authorization is terminated or revoked sooner. Performed at [facility], [address], [place], [place] [address]   Blood culture (routine x 2)     Status: None (Preliminary result)   Collection Time: 07/20/18  7:45 PM   Specimen: BLOOD  Result Value Ref Range Status   Specimen Description BLOOD FEMORAL ARTERY THIGH  Final   Special Requests   Final    BOTTLES DRAWN AEROBIC AND ANAEROBIC Blood Culture adequate volume   Culture   Final    NO GROWTH 2 DAYS Performed at [facility], [address], [place], [place] [address]    Report Status PENDING  Incomplete  Blood culture (routine x 2)     Status: None (Preliminary result)   Collection Time: 07/20/18  9:40 PM   Specimen: BLOOD  Result Value Ref Range Status   Specimen  Description BLOOD PIC LINE  Final   Special Requests   Final    BOTTLES DRAWN AEROBIC AND ANAEROBIC Blood Culture adequate volume   Culture   Final    NO GROWTH 2 DAYS Performed at [facility], [address], [place], [place] [address]    Report Status PENDING  Incomplete         Radiology Studies: Ct Abdomen Pelvis Wo Contrast  Result Date: 07/20/2018 CLINICAL DATA:  Abdomen distension EXAM: CT ABDOMEN AND PELVIS WITHOUT CONTRAST TECHNIQUE: Multidetector CT imaging of the abdomen and pelvis was performed following the standard protocol without IV contrast. COMPARISON:  Barium enema 05/14/2003 FINDINGS: Lower chest: Lung bases demonstrate partial atelectasis at the left base. Trace left pleural effusion. Heart size within normal limits. Hepatobiliary: No focal hepatic abnormality. Numerous calcified gallstones. No biliary dilatation. Pancreas: Unremarkable. No pancreatic ductal dilatation or surrounding inflammatory changes. Spleen: Normal in size without focal abnormality. Adrenals/Urinary Tract: Adrenal glands are normal. Kidneys show no hydronephrosis. Probable cyst upper pole left kidney. Urinary bladder is unremarkable. Stomach/Bowel: The stomach is nonenlarged. No dilated small bowel. Extremely tortuous and redundant colon. No colon wall thickening. Massive distension of the sigmoid colon up to 16 cm with fluid appearing stools in the rectum. Negative appendix. Vascular/Lymphatic: Nonaneurysmal aorta. No significantly enlarged lymph nodes. Reproductive: Enlarged uterus with suspected fundal mass measuring 7.8 cm. No definite adnexal mass. Other: No free air or free fluid. Musculoskeletal: Scoliosis and degenerative changes of the spine. Chronic appearing bony remodeling of the proximal left femur. Skin thickening and irregular soft tissue density left lateral hip extending toward the femoral trochanter which may reflect decubitus ulcer. No acute gas or fluid in this region. Left upper back  soft tissue wound or ulcer with subcutaneous edema and multiple foci of gas. This is contiguous with a large gas and fluid collection measuring at least 3.4 x 9.7 cm within the left flank subcutaneous soft tissues. No gross bony destructive change. IMPRESSION: 1. Anatomy somewhat distorted by patient's scoliosis. There is massive distention of the sigmoid colon which is contiguous with fluid filled rectum. No discrete colonic narrowing or evidence for volvulus. Findings suggest functional obstruction at the level of the rectosigmoid colon. Negative for perforation. 2. Large wound involving the skin and subcutaneous tissues of the left back and flank region with multiple foci of gas. Dominant fluid and  gas collection in the left flank soft tissues measuring at least 9.7 cm, concerning for abscess or potential necrotic infection. Recommend correlation with direct inspection. 3. Numerous gallstones 4. Bulky uterus with suspected fibroids. Electronically Signed   By: Jasmine Pang M.D.   On: 07/20/2018 21:23   [place] Ekg Site Rite  Result Date: 07/20/2018 If Site Rite image not attached, placement could not be confirmed due to current cardiac rhythm.       Scheduled Meds:  docusate sodium  100 mg Oral BID   enoxaparin (LOVENOX) injection  40 mg Subcutaneous Q24H   loratadine  10 mg Oral Daily   montelukast  10 mg Oral QHS   pantoprazole  40 mg Oral Daily   PHENobarbital  32.4 mg Oral BID   simvastatin  10 mg Oral QHS   sodium chloride flush  10-40 mL Intracatheter Q12H   Continuous Infusions:  piperacillin-tazobactam (ZOSYN)  IV 3.375 g (07/22/18 0648)   vancomycin 750 mg (07/22/18 0100)     LOS: 2 days    Time spent: 30 minutes    Montie Swiderski Hoover Brunette, DO [facility] Pager [phone]  If 7PM-7AM, please contact night-coverage [email or web address] Password [email or web address] 07/22/2018, 11:23 AM

## 2018-07-22 NOTE — Plan of Care (Signed)
  Problem: Acute Rehab PT Goals(only PT should resolve) Goal: Pt will Roll Supine to Side 07/22/2018 0944 by Laneta Simmers B, PT Outcome: Progressing Flowsheets (Taken 07/22/2018 0944) Pt will Roll Supine to Side:  with max assist  with cues (comment type and amount) Note: Verbal and tactile 07/22/2018 0942 by Laneta Simmers B, PT Outcome: Progressing Flowsheets (Taken 07/22/2018 [phone]) Pt will Roll Supine to Side:  with max assist  with cues (comment type and amount) Note: Verbal and tactile Goal: PT Additional Goal #1 07/22/2018 0944 by Laneta Simmers B, PT Outcome: Progressing Flowsheets (Taken 07/22/2018 [phone]) Additional Goal #1: Pt will tolerate remaining up in chair for 2-3 hr periods without pain and in proper positioning to continue pressure relief. 07/22/2018 3736 by Laneta Simmers B, PT Outcome: Progressing Flowsheets (Taken 07/22/2018 [phone]) Additional Goal #1: Pt will tolerate remaining up in chair for 2-3 hr periods without pain and in proper positioning to continue pressure relief.   Problem: Acute Rehab PT Goals(only PT should resolve) Goal: Pt Will Transfer Bed To Chair/Chair To Bed Outcome: Progressing Flowsheets (Taken 07/22/2018 0944) Pt will Transfer Bed to Chair/Chair to Bed:  Other (comment)  total assist Note: Using hoyer lift with proper technique and safety   Talbot Grumbling PT, DPT

## 2018-07-22 NOTE — Evaluation (Signed)
Physical Therapy Evaluation Patient Details Name: April Watson MRN: [id number] DOB: 03-Aug-1960 Today's Date: 07/22/2018   History of Present Illness  April Watson is a 58 y.o. female with medical history significant of history of high-grade rectum dysplasia, chronic constipation, congenital hydrocephalus, MR, GERD, hyperlipidemia, hypertension, seizure disorder who was sent by Dr. Moshe Cipro for evaluation of abdominal distention for several days and progressively worse left flank/lower back wound-started very small back in March, but now has become a lot more extensive and has been draining malodorous fluid since about a week.  No fever, chills, nausea or vomiting per sister.  She has chronic constipation, but has been having a small bowel movements.  She has been following with wound care.    Clinical Impression  Pt is a pleasant 58 YO female with a baseline of total assist for bed mobility, dressing, bathing, and feeding. Pt currently remains up in chair 7-8 hours per day and is cared for by sister and aides around the clock. Pt with multiple complex pressure wounds continuing to heal from recent surgical intervention limiting mobility examination to some degree. Pt with flexion contractures noted throughout BUE shoulders, elbows, wrists, fingers, hips, and knees. Pt with good PROM throughout ankles and able to achieve passive dorsiflexion. Pt positioned in bed for pressure relief to healing pressure wounds. Pt tolerates PROM to all extremities, in no apparent pain or distress and smiles with verbal commands. Pt able to squeeze therapist's hand using pt's L hand and verbal/tactile cues. Pt would benefit from SNF to improve strength, sitting balance, endurance for safe ADLs, contracture management, wound management and overall family training/education on proper care at home due to level of care needed.    Follow Up Recommendations SNF    Equipment Recommendations  None recommended by PT     Recommendations for Other Services       Precautions / Restrictions Precautions Precautions: Fall Restrictions Weight Bearing Restrictions: No      Mobility  Bed Mobility Overal bed mobility: Needs Assistance   Rolling: Total assist         General bed mobility comments: Pt's baseline is dependent for bed mobility.  Transfers Overall transfer level: Needs assistance               General transfer comment: Pt's baseline is hoyer lift use for transfers.  Ambulation/Gait             General Gait Details: Pt does not ambulate at baseline. Pt's sister reports pt uses manual WC that sister or aide manually pushes.  Stairs            Wheelchair Mobility    Modified Rankin (Stroke Patients Only)       Balance                                             Pertinent Vitals/Pain Pain Assessment: Faces(Pt smiles when spoken to.) Pain Score: 0-No pain    Home Living Family/patient expects to be discharged to:: Private residence Living Arrangements: Other (Comment)(Pt's sister reports pt lives with her and personal aide 7-8 hrs/day.) Available Help at Discharge: Family;Personal care attendant Type of Home: House Home Access: Ramped entrance     Home Layout: One level Home Equipment: Wheelchair - manual;Hospital bed;Other (comment)(Hoyer lift)      Prior Function Level of Independence: Needs assistance   Gait /  Transfers Assistance Needed: Pt's sister reports pt is nonambulatory, sister and aide April Watson lift to transfer from bed to recliner, WC or toilet in bathroom. Pt's sister reports pt enjoys sitting up in chair and remains in chair 7-8 hrs per day.  ADL's / Homemaking Assistance Needed: Pt's sister reports her and pt's aide provide sponge baths and pt is dependent for self care and feeding without difficulty.  Comments: Pt's sister reports they have handicap Lucianne Lei for travel.     Hand Dominance        Extremity/Trunk  Assessment   Upper Extremity Assessment Upper Extremity Assessment: RUE deficits/detail;LUE deficits/detail;Difficult to assess due to impaired cognition(Pt with flexed contractures throughout bil wrists ,elbows, and fingers preventing full PROM. No facial expression of pain with assessment. Pt's uses L hand to squeeze therapist's hand with multiple verbal/tactile cues.)    Lower Extremity Assessment Lower Extremity Assessment: RLE deficits/detail;LLE deficits/detail;Difficult to assess due to impaired cognition(Unable to test strength due to cognition. Pt with flexion contractures throughout bil knees. Unable to fully assess hips due to positioned for pressure relief in bed. Pt with PROM in bil ankle WNL.)       Communication   Communication: Other (comment)(Pt's sister reports pt is non-verbal and has never spoken.)  Cognition                                       General Comments: Pt baseline with cognitive handicap, nonverbal, and smiles when spoken to. Sister answers all questions.      General Comments      Exercises     Assessment/Plan    PT Assessment Patient needs continued PT services  PT Problem List Decreased strength;Decreased range of motion;Decreased activity tolerance;Decreased balance;Decreased mobility;Decreased coordination;Decreased cognition;Decreased safety awareness;Impaired sensation;Impaired tone;Decreased skin integrity       PT Treatment Interventions DME instruction;Functional mobility training;Therapeutic activities;Therapeutic exercise;Patient/family education;Wheelchair mobility training    PT Goals (Current goals can be found in the Care Plan section)  Acute Rehab PT Goals Patient Stated Goal: Return home once wounds have healed and back to baseline. PT Goal Formulation: With family Time For Goal Achievement: 08/05/18 Potential to Achieve Goals: Fair    Frequency Min 2X/week   Barriers to discharge        Co-evaluation                AM-PAC PT "6 Clicks" Mobility  Outcome Measure Help needed turning from your back to your side while in a flat bed without using bedrails?: Total Help needed moving from lying on your back to sitting on the side of a flat bed without using bedrails?: Total Help needed moving to and from a bed to a chair (including a wheelchair)?: Total Help needed standing up from a chair using your arms (e.g., wheelchair or bedside chair)?: Total Help needed to walk in hospital room?: Total Help needed climbing 3-5 steps with a railing? : Total 6 Click Score: 6    End of Session   Activity Tolerance: Patient tolerated treatment well Patient left: in bed;with call bell/phone within reach;with family/visitor present(Pt's sister in room) Nurse Communication: Mobility status PT Visit Diagnosis: Other abnormalities of gait and mobility (R26.89);Muscle weakness (generalized) (M62.81)    Time: 9470-9628 PT Time Calculation (min) (ACUTE ONLY): 20 min   Charges:   PT Evaluation $PT Eval Moderate Complexity: 1 Mod  Talbot Grumbling PT, DPT

## 2018-07-22 NOTE — Progress Notes (Signed)
[facility]  Plan for some more local debridement at bedside tomorrow. Hopefully can start some santyl too while doing dressing changes. Have increased to TID due to the amount of drainage described by the RNs.  Case Managers made aware of possibility of wound vac at home at discharge and possible need for airbed/ gel mattress for pressure sore prevention.  Curlene Labrum, MD [facility] [address] [place], [place] [address] [phone] (office)

## 2018-07-22 NOTE — TOC Initial Note (Addendum)
Transition of Care [facility]) - Initial/Assessment Note    Patient Details  Name: April Watson MRN: [id number] Date of Birth: August 11, 1960  Transition of [facility]) CM/SW Contact:    Ihor Gully, LCSW Phone Number: 07/22/2018, 1:59 PM  Clinical Narrative:                 Patient's sister/guardian, Lindwood Qua, provided history. SNF recommendation was discussed. Ms. Shari Prows indicated that after consult with additional family members, they do not desire for patient to go to SNF. This decision was made because family will not be able to visit/stay with patient and patient is total care an non verbal.  Patient is active with [facility] but family no longer wishes to have services provided by them.  [facility] [place] agencies provided and referral sent to family choice.  Patient has hospital bed and has had it since 2014.   Patient will likely need a gel overlay or air mattress, and wound vac at discharge. Dr. Constance Haw plans on cleaning wound again 07/23/2018 and patient will likely discharge at the earliest on Monday. TOC will assess hospital bed status and identify which type of mattress will be best suited for bed.        Expected Discharge Plan: [place] Barriers to Discharge: No Barriers Identified   Patient Goals and CMS Choice Patient states their goals for this hospitalization and ongoing recovery are:: Patient to return home with [facility] services. CMS Medicare.gov Compare Post Acute Care list provided to:: Patient Represenative (must comment)(sister/guardian: Lindwood Qua) Choice offered to / list presented to : [place] / Guardian  Expected Discharge Plan and Services Expected Discharge Plan: [place] In-house Referral: Clinical Social Work Discharge Planning Services: CM Consult Post Acute Care Choice: Home Health, Durable Medical Equipment Living arrangements for the past 2 months: [place] Expected Discharge Date: 07/26/18                          HH Arranged: RN, PT [place] Agency: [place] Date [facility] Agency Contacted: 07/22/18 Time [place]: [id number] Representative spoke with at [place]: Tommi Rumps  Prior Living Arrangements/Services Living arrangements for the past 2 months: [place] Lives with:: Siblings Patient language and need for interpreter reviewed:: Yes Do you feel safe going back to the place where you live?: Yes      Need for Family Participation in Patient Care: Yes (Comment) Care giver support system in place?: Yes (comment) Current home services: DME, Homehealth aide(Patient has CAP aide) Criminal Activity/Legal Involvement Pertinent to Current Situation/Hospitalization: No - Comment as needed  Activities of Daily Living Home Assistive Devices/Equipment: [employer] fast streamer ADL Screening (condition at time of admission) Patient's cognitive ability adequate to safely complete daily activities?: No Is the patient deaf or have difficulty hearing?: No Does the patient have difficulty seeing, even when wearing glasses/contacts?: No Does the patient have difficulty concentrating, remembering, or making decisions?: Yes(unable to assess ) Patient able to express need for assistance with ADLs?: No Does the patient have difficulty dressing or bathing?: Yes Independently performs ADLs?: No Communication: Dependent Is this a change from baseline?: Pre-admission baseline Dressing (OT): Dependent Is this a change from baseline?: Pre-admission baseline Grooming: Dependent Is this a change from baseline?: Pre-admission baseline Feeding: Dependent Is this a change from baseline?: Pre-admission baseline Bathing: Dependent Is this a change from baseline?: Pre-admission baseline Toileting: Dependent Is this a change  from baseline?: Pre-admission baseline In/Out Bed: Dependent(n/a) Is this a change from baseline?: Pre-admission baseline Walks in Home: Dependent(n/a pt unable to ambulate) Is this a  change from baseline?: Pre-admission baseline(n/a) Does the patient have difficulty walking or climbing stairs?: Yes(Pt unable to ambulate) Weakness of Legs: Both(contracture noted BLE ) Weakness of Arms/Hands: Both(Limited movement to BUE)  Permission Sought/Granted Permission sought to share information with : Family Supports, PCP                Emotional Assessment Appearance:: Appears stated age   Affect (typically observed): Unable to Assess   Alcohol / Substance Use: Not Applicable Psych Involvement: No (comment)  Admission diagnosis:  Abscess [L02.91] Patient Active Problem List   Diagnosis Date Noted  . Decubitus ulcer of back, stage 3 ([place])   . Left ischial pressure sore, stage II ([place])   . Abscess   . Abdominal distension 07/20/2018  . Weakness 07/20/2018  . Sepsis due to cellulitis ([place]) 07/20/2018  . Seizure disorder ([place])   . Sacral ulcer, limited to breakdown of skin ([place]) 04/06/2018  . Dermatomycosis 04/06/2018  . Quadriplegia and quadriparesis ([place]) 09/11/2016  . Decubitus ulcer of sacral region, stage 2 ([place]) 08/22/2015  . Tinea cruris 06/05/2015  . Vitamin D deficiency 01/21/2014  . 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 Allergic cough 11/17/2012  . Family history of colon cancer 05/19/2011  . Type 2 diabetes mellitus with neurological complications ([place]) 73/66/8159  . COLONIC POLYPS, ADENOMATOUS, HX OF 11/30/2008  . GERD 11/29/2008  . Hyperlipidemia LDL goal <100 08/17/2007  . Essential hypertension 08/17/2007  . Chronic constipation 08/17/2007  . 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 Convulsions ([place]) 08/17/2007   PCP:  Fayrene Helper, MD Pharmacy:   [facility] [place] [address] Phone: [phone] Fax: [phone]     Social Determinants of Health (SDOH) Interventions    Readmission Risk Interventions No flowsheet data found.

## 2018-07-22 NOTE — Care Management Important Message (Signed)
Important Message  Patient Details  Name: April Watson MRN: [id number] Date of Birth: [birth year]   Medicare Important Message Given:  Yes     Tommy Medal 07/22/2018, 11:48 AM

## 2018-07-22 NOTE — Progress Notes (Signed)
Wound changed per MD order.

## 2018-07-23 LAB — CBC
HCT: 32.6 % — ABNORMAL LOW (ref 36.0–46.0)
Hemoglobin: 10 g/dL — ABNORMAL LOW (ref 12.0–15.0)
MCH: 28.7 pg (ref 26.0–34.0)
MCHC: 30.7 g/dL (ref 30.0–36.0)
MCV: 93.7 fL (ref 80.0–100.0)
Platelets: 343 10*3/uL (ref 150–400)
RBC: 3.48 MIL/uL — ABNORMAL LOW (ref 3.87–5.11)
RDW: 14.8 % (ref 11.5–15.5)
WBC: 16 10*3/uL — ABNORMAL HIGH (ref 4.0–10.5)
nRBC: 0 % (ref 0.0–0.2)

## 2018-07-23 LAB — BASIC METABOLIC PANEL
Anion gap: 7 (ref 5–15)
BUN: 21 mg/dL — ABNORMAL HIGH (ref 6–20)
CO2: 26 mmol/L (ref 22–32)
Calcium: 8.2 mg/dL — ABNORMAL LOW (ref 8.9–10.3)
Chloride: 103 mmol/L (ref 98–111)
Creatinine, Ser: 0.56 mg/dL (ref 0.44–1.00)
GFR calc Af Amer: >60 mL/min (ref >60)
GFR calc non Af Amer: >60 mL/min (ref >60)
Glucose, Bld: 117 mg/dL — ABNORMAL HIGH (ref 70–99)
Potassium: 3.5 mmol/L (ref 3.5–5.1)
Sodium: 136 mmol/L (ref 135–145)

## 2018-07-23 LAB — VANCOMYCIN, TROUGH: Vancomycin Tr: 27 ug/mL (ref 15–20)

## 2018-07-23 MED ORDER — POTASSIUM CHLORIDE CRYS ER 20 MEQ PO TBCR
40.0000 meq | EXTENDED_RELEASE_TABLET | Freq: Once | ORAL | Status: AC
  Administered 2018-07-23: 40 meq via ORAL
  Filled 2018-07-23: qty 2

## 2018-07-23 MED ORDER — COLLAGENASE 250 UNIT/GM EX OINT
TOPICAL_OINTMENT | Freq: Four times a day (QID) | CUTANEOUS | Status: DC
  Administered 2018-07-23 – 2018-07-25 (×7): via TOPICAL
  Filled 2018-07-23: qty 30

## 2018-07-23 MED ORDER — POLYETHYLENE GLYCOL 3350 17 G PO PACK
17.0000 g | PACK | Freq: Every morning | ORAL | Status: DC
  Administered 2018-07-23 – 2018-07-26 (×4): 17 g via ORAL
  Filled 2018-07-23 (×4): qty 1

## 2018-07-23 MED ORDER — VANCOMYCIN HCL IN DEXTROSE 750-5 MG/150ML-% IV SOLN
750.0000 mg | INTRAVENOUS | Status: DC
  Administered 2018-07-24: 750 mg via INTRAVENOUS
  Filled 2018-07-23: qty 150

## 2018-07-23 NOTE — Progress Notes (Addendum)
Pharmacy Antibiotic Note  April Watson is a 58 y.o. female admitted on 07/20/2018 with cellulitis.  Pharmacy has been consulted for Zosyn and vancomycin dosing.  Plan: Vanco trough 27- hold and restart in AM Decrease to Vancomycin 750 mg IV every 24 hours Goal vancomycin trough range: 15-20 mcg/mL Zosyn 3.375g IV q8h (4 hour infusion). Pharmacy to monitor labs, cultures, vancomycin troughs as clinically appropriate, and patient progress.  Height: 5\' 3"  (160 cm) Weight: 156 lb 15.5 oz (71.2 kg) IBW/kg (Calculated) : 52.4  Temp (24hrs), Avg:98.4 F (36.9 C), Min:98.4 F (36.9 C), Max:98.4 F (36.9 C)  Recent Labs  Lab 07/20/18 1945 07/21/18 0606 07/21/18 0644 07/21/18 0752 07/22/18 0453 07/23/18 0635 07/23/18 1301  WBC 19.2* 20.5*  --   --  18.0* 16.0*  --   CREATININE 0.62  --  0.68  --  0.60 0.56  --   LATICACIDVEN 1.4  --   --  1.1 1.0  --   --   VANCOTROUGH  --   --   --   --   --   --  27*    Estimated Creatinine Clearance: 72.5 mL/min (by C-G formula based on SCr of 0.56 mg/dL).    No Known Allergies  Antimicrobials this admission: 7/1 vancomycin >>  7/1 zosyn >>   Microbiology results: 7/1 [facility] x2: ngtd 7/1 Wound Cx:  GS= few GPR, GPC, 7/1 Covid 19 negative  Thank you for allowing pharmacy to be a part of this patient's care.  Ramond Craver 07/23/2018 2:02 PM

## 2018-07-23 NOTE — Progress Notes (Signed)
PROGRESS NOTE    April Watson  WUJ:[id number] DOB: [birth year] DOA: 07/20/2018 PCP: Kerri Perches, MD   Brief Narrative:  Per HPI: April Elbert Ewings Mooreis a 58 y.o.femalewith medical history significant ofhistory of high-grade rectum dysplasia, chronic constipation, congenital hydrocephalus, MR, GERD, hyperlipidemia, hypertension, seizure disorder who was sent by Dr.Simpsonfor evaluation of abdominal distention for several daysandprogressively worseleft flank/lower backwound-started very small back in March, but now has become a lot more extensive and has been draining malodorous fluid since about a week.No fever, chills, nausea or vomiting per sister. She has chronic constipation, but has been having a small bowel movements. She has been following with wound care.  Patient was admitted with sepsis secondary to cellulitis and multiple draining wounds to her back along with abdominal distention with suspected ileus secondary to chronic constipation and electrolyte abnormalities with hypokalemia.  She has undergone debridement and drainage of her infected left flank decubitus wound with abscess on 7/2 and remains on vancomycin and Zosyn.  She has been seen by physical therapy with recommendations for SNF placement on discharge, the family would like to go home with home health when ready.   Assessment & Plan:   Principal Problem:   Sepsis due to cellulitis [facility]) Active Problems:   Hyperlipidemia LDL goal <100   Essential hypertension   GERD   Chronic constipation   Type 2 diabetes mellitus with neurological complications (HCC)   Decubitus ulcer of sacral region, stage 2 (HCC)   Quadriplegia and quadriparesis (HCC)   Abdominal distension   Seizure disorder (HCC)   Decubitus ulcer of back, stage 3 (HCC)   Left ischial pressure sore, stage II (HCC)   Abscess   Sepsis due to cellulitiswith draining wounds Patient noted to have gram-negative rods and gram-positive cocci on  Gram stain of draining fluid-need further cartilage for ID and sensitivities Continue vancomycin and Zosyn empirically Blood cultures with no growth noted in last 3 days Repeat a.m. CBC  Active Problems: Abdominal distension-soft Secondary to chronic constipation with electrolyte abnormalities. Potassium to be administered orally for 1 dose today Magnesium within normal limits Appears to be tolerating diet Discontinue IV fluid  Cough Will obtain chest x-ray to evaluate Mucinex ordered Loratadine started  Chronic constipation Start on MiraLAX daily Potassium almost repleted magnesium is 2  Hyperlipidemia LDL goal <100 Continue simvastatin.  Essential hypertension Hold blood pressure agents for now until blood pressure elevates  GERD Continue Protonix.  Type 2 diabetes mellitus with neurological complications (HCC) Diet controlled. CBG monitoring regular insulin sliding scale.  Quadriplegia and quadriparesiswith congenital hydrocephalus Supportive care.  Seizure disorder (HCC) Continue phenobarbital 32.4 p.o. twice daily.   DVT prophylaxis:Lovenox Code Status:Full Family Communication:Sister at bedside Disposition Plan:Appreciate general surgery for wound debridement.  Will likely go home with home health and wound VAC when ready for discharge in the next 2 to 3 days.  Consultants:   General surgery  Procedures:   7/2 debridement and drainage of left flank stage III decubitus wound  7/4 further debridement as noted  Antimicrobials:   Vancomycin and Zosyn 7/1->  Subjective: Patient seen and evaluated today with no new acute complaints or concerns. No acute concerns or events noted overnight.  She has less coughing and chest x-ray did not demonstrate any abnormalities on 7/3.  Sister at bedside is worried about frequency of bowel movements.  She appears to be tolerating diet and did have a bowel movement last night.   Objective: Vitals:   07/22/18 0732 07/22/18 1300 07/22/18  2222 07/23/18 0537  BP:  108/68 108/65 123/63  Pulse:  76 86 (!) 103  Resp:  20 17 16   Temp:  98.4 F (36.9 C) 98.4 F (36.9 C) 98.4 F (36.9 C)  TempSrc:  Axillary  Oral  SpO2: 94% 95% 100% 98%  Weight:      Height:        Intake/Output Summary (Last 24 hours) at 07/23/2018 1028 Last data filed at 07/23/2018 0934 Gross per 24 hour  Intake 738.24 ml  Output 500 ml  Net 238.24 ml   Filed Weights   07/20/18 1653 07/21/18 0700  Weight: 72.6 kg 71.2 kg    Examination:  General exam: Somnolent Respiratory system: Clear to auscultation. Respiratory effort normal. Cardiovascular system: S1 & S2 heard, RRR. No JVD, murmurs, rubs, gallops or clicks. No pedal edema. Gastrointestinal system: Abdomen is minimally distended, soft and nontender. No organomegaly or masses felt. Normal bowel sounds heard. Central nervous system: Somnolent Extremities: Symmetric 5 x 5 power. Skin: Per general surgery note with left flank wound. Psychiatry: Cannot be evaluated.    Data Reviewed: I have personally reviewed following labs and imaging studies  CBC: Recent Labs  Lab 07/20/18 1945 07/21/18 0606 07/22/18 0453 07/23/18 0635  WBC 19.2* 20.5* 18.0* 16.0*  NEUTROABS 14.6* 16.2*  --   --   HGB 11.2* 10.3* 10.0* 10.0*  HCT 35.6* 32.6* 32.6* 32.6*  MCV 90.4 91.8 94.2 93.7  PLT 132* 292 347 343   Basic Metabolic Panel: Recent Labs  Lab 07/20/18 1945 07/20/18 2140 07/21/18 0644 07/22/18 0453 07/23/18 0635  NA 138  --  140 137 136  K 2.5*  --  2.4* 3.8 3.5  CL 95*  --  99 105 103  CO2 28  --  27 26 26   GLUCOSE 147*  --  172* 132* 117*  BUN 29*  --  25* 21* 21*  CREATININE 0.62  --  0.68 0.60 0.56  CALCIUM 8.6*  --  8.4* 8.2* 8.2*  MG  --  1.8  --  2.0  --   PHOS  --  2.9  --   --   --    GFR: Estimated Creatinine Clearance: 72.5 mL/min (by C-G formula based on SCr of 0.56 mg/dL). Liver Function Tests: Recent Labs   Lab 07/20/18 1945  AST 18  ALT 16  ALKPHOS 74  BILITOT 0.8  PROT 6.0*  ALBUMIN 2.2*   No results for input(s): LIPASE, AMYLASE in the last 168 hours. No results for input(s): AMMONIA in the last 168 hours. Coagulation Profile: No results for input(s): INR, PROTIME in the last 168 hours. Cardiac Enzymes: No results for input(s): CKTOTAL, CKMB, CKMBINDEX, TROPONINI in the last 168 hours. BNP (last 3 results) No results for input(s): PROBNP in the last 8760 hours. HbA1C: No results for input(s): HGBA1C in the last 72 hours. CBG: No results for input(s): GLUCAP in the last 168 hours. Lipid Profile: No results for input(s): CHOL, HDL, LDLCALC, TRIG, CHOLHDL, LDLDIRECT in the last 72 hours. Thyroid Function Tests: No results for input(s): TSH, T4TOTAL, FREET4, T3FREE, THYROIDAB in the last 72 hours. Anemia Panel: No results for input(s): VITAMINB12, FOLATE, FERRITIN, TIBC, IRON, RETICCTPCT in the last 72 hours. Sepsis Labs: Recent Labs  Lab 07/20/18 1945 07/21/18 0752 07/22/18 0453  LATICACIDVEN 1.4 1.1 1.0    Recent Results (from the past 240 hour(s))  Wound or Superficial Culture     Status: None (Preliminary result)   Collection Time: 07/20/18  5:00 PM   Specimen: Skin, Cyst/Tag/Debridement; Wound  Result Value Ref Range Status   Specimen Description SKIN BIOPSY  Final   Special Requests Normal  Final   Gram Stain   Final    FEW WBC PRESENT, PREDOMINANTLY PMN ABUNDANT GRAM POSITIVE COCCI ABUNDANT GRAM NEGATIVE RODS FEW GRAM POSITIVE RODS    Culture   Final    FEW PROTEUS MIRABILIS CULTURE REINCUBATED FOR BETTER GROWTH    Report Status PENDING  Incomplete   Organism ID, Bacteria PROTEUS MIRABILIS  Final      Susceptibility   Proteus mirabilis - MIC*    AMPICILLIN <=2 SENSITIVE Sensitive     CEFAZOLIN <=4 SENSITIVE Sensitive     CEFEPIME <=1 SENSITIVE Sensitive     CEFTAZIDIME <=1 SENSITIVE Sensitive     CEFTRIAXONE <=1 SENSITIVE Sensitive     CIPROFLOXACIN  <=0.25 SENSITIVE Sensitive     GENTAMICIN <=1 SENSITIVE Sensitive     IMIPENEM 8 INTERMEDIATE Intermediate     TRIMETH/SULFA <=20 SENSITIVE Sensitive     AMPICILLIN/SULBACTAM <=2 SENSITIVE Sensitive     PIP/TAZO Value in next row Sensitive      <=4 SENSITIVEPerformed at [facility], [address], [place], [place] [address]    * FEW PROTEUS MIRABILIS  SARS Coronavirus 2 (CEPHEID - Performed in [facility] hospital lab), Hosp Order     Status: None   Collection Time: 07/20/18  5:02 PM   Specimen: Skin Biopsy; Nasopharyngeal  Result Value Ref Range Status   SARS Coronavirus 2 NEGATIVE NEGATIVE Final    Comment: (NOTE) If result is NEGATIVE SARS-CoV-2 target nucleic acids are NOT DETECTED. The SARS-CoV-2 RNA is generally detectable in upper and lower  respiratory specimens during the acute phase of infection. The lowest  concentration of SARS-CoV-2 viral copies this assay can detect is 250  copies / mL. A negative result does not preclude SARS-CoV-2 infection  and should not be used as the sole basis for treatment or other  patient management decisions.  A negative result may occur with  improper specimen collection / handling, submission of specimen other  than nasopharyngeal swab, presence of viral mutation(s) within the  areas targeted by this assay, and inadequate number of viral copies  (<250 copies / mL). A negative result must be combined with clinical  observations, patient history, and epidemiological information. If result is POSITIVE SARS-CoV-2 target nucleic acids are DETECTED. The SARS-CoV-2 RNA is generally detectable in upper and lower  respiratory specimens dur ing the acute phase of infection.  Positive  results are indicative of active infection with SARS-CoV-2.  Clinical  correlation with patient history and other diagnostic information is  necessary to determine patient infection status.  Positive results do  not rule out bacterial infection or co-infection  with other viruses. If result is PRESUMPTIVE POSTIVE SARS-CoV-2 nucleic acids MAY BE PRESENT.   A presumptive positive result was obtained on the submitted specimen  and confirmed on repeat testing.  While 2019 novel coronavirus  (SARS-CoV-2) nucleic acids may be present in the submitted sample  additional confirmatory testing may be necessary for epidemiological  and / or clinical management purposes  to differentiate between  SARS-CoV-2 and other Sarbecovirus currently known to infect humans.  If clinically indicated additional testing with an alternate test  methodology [phone]) is advised. The SARS-CoV-2 RNA is generally  detectable in upper and lower respiratory sp ecimens during the acute  phase of infection. The expected result is Negative. Fact Sheet for  Patients:  BoilerBrush.com.cy Fact Sheet for Healthcare Providers: https://pope.com/ This test is not yet approved or cleared by the [place] FDA and has been authorized for detection and/or diagnosis of SARS-CoV-2 by FDA under an Emergency Use Authorization (EUA).  This EUA will remain in effect (meaning this test can be used) for the duration of the COVID-19 declaration under Section 564(b)(1) of the Act, 21 U.S.C. section 360bbb-3(b)(1), unless the authorization is terminated or revoked sooner. Performed at [facility], [address], [place], [place] [address]   Blood culture (routine x 2)     Status: None (Preliminary result)   Collection Time: 07/20/18  7:45 PM   Specimen: BLOOD  Result Value Ref Range Status   Specimen Description BLOOD FEMORAL ARTERY THIGH  Final   Special Requests   Final    BOTTLES DRAWN AEROBIC AND ANAEROBIC Blood Culture adequate volume   Culture   Final    NO GROWTH 3 DAYS Performed at [facility], [address] [place], [place] [address]    Report Status PENDING  Incomplete  Blood culture (routine x 2)     Status: None (Preliminary  result)   Collection Time: 07/20/18  9:40 PM   Specimen: BLOOD  Result Value Ref Range Status   Specimen Description BLOOD PIC LINE  Final   Special Requests   Final    BOTTLES DRAWN AEROBIC AND ANAEROBIC Blood Culture adequate volume   Culture   Final    NO GROWTH 3 DAYS Performed at [facility], [address], [place], [place] [address]    Report Status PENDING  Incomplete  Aerobic/Anaerobic Culture (surgical/deep wound)     Status: None (Preliminary result)   Collection Time: 07/21/18 12:57 PM   Specimen: Back; Abscess  Result Value Ref Range Status   Specimen Description   Final    BACK Performed at [facility], [address], [place], [place] [address]    Special Requests   Final    NONE Performed at [facility], [address], [place], [place] [address]    Gram Stain   Final    MODERATE WBC PRESENT, PREDOMINANTLY PMN ABUNDANT GRAM POSITIVE COCCI IN CLUSTERS ABUNDANT GRAM NEGATIVE RODS FEW GRAM POSITIVE RODS Performed at [facility], [address], [place], [place] [address]    Culture PENDING  Incomplete   Report Status PENDING  Incomplete         Radiology Studies: Dg Chest Port 1 View  Result Date: 07/22/2018 CLINICAL DATA:  Cough.  Draining wound. EXAM: PORTABLE CHEST 1 VIEW COMPARISON:  July 09, 2009 FINDINGS: The distal tip of the right PICC line is difficult to visualize but likely terminates near the caval atrial junction. Severe scoliotic changes are noted. The cardiomediastinal silhouette is stable. No pneumothorax. No nodules or masses. No focal infiltrates. No other acute abnormalities are identified. IMPRESSION: Severe scoliosis with low lung volumes limit evaluation. The distal tip of the right PICC line is not well seen but likely near the caval atrial junction. No other acute abnormalities. Electronically Signed   By: Gerome Sam III M.D   On: 07/22/2018 15:37        Scheduled Meds: . collagenase   Topical QID  .  dextromethorphan-guaiFENesin  1 tablet Oral BID  . docusate sodium  100 mg Oral BID  . enoxaparin (LOVENOX) injection  40 mg Subcutaneous Q24H  . loratadine  10 mg Oral Daily  . montelukast  10 mg Oral QHS  . nutrition  supplement (JUVEN)  1 packet Oral BID BM  . pantoprazole  40 mg Oral Daily  . PHENobarbital  32.4 mg Oral BID  . polyethylene glycol powder  17 g Oral q morning - 10a  . potassium chloride  40 mEq Oral Once  . Ensure Max Protein  11 oz Oral BID  . simvastatin  10 mg Oral QHS  . sodium chloride flush  10-40 mL Intracatheter Q12H   Continuous Infusions: . piperacillin-tazobactam (ZOSYN)  IV 3.375 g (07/23/18 0526)  . vancomycin 750 mg (07/23/18 0110)     LOS: 3 days    Time spent: 30 minutes    Shadonna Benedick [place], DO [facility] Pager [phone]  If 7PM-7AM, please contact night-coverage [email or web address] Password [email or web address] 07/23/2018, 10:28 AM

## 2018-07-23 NOTE — Progress Notes (Signed)
[facility] Progress Note     Subjective: No major issues reported.  Some minor bedside debridement done to remove central necrotic tissue today.   Objective: Vital signs in last 24 hours: Temp:  [98.4 F (36.9 C)] 98.4 F (36.9 C) (07/04 0537) Pulse Rate:  [76-103] 103 (07/04 0537) Resp:  [16-20] 16 (07/04 0537) BP: (108-123)/(63-68) 123/63 (07/04 0537) SpO2:  [95 %-100 %] 98 % (07/04 0537) Last BM Date: 07/20/18  Intake/Output from previous day: 07/03 0701 - 07/04 0700 In: 638.2 [P.O.:480; IV Piggyback:158.2] Out: 500 [Urine:500] Intake/Output this shift: Total I/O In: 160 [P.O.:120; I.V.:40] Out: -   General appearance: alert and no distress Resp: normal work of breathing left flank wound with some granulation starting, minimal fibrinous necrotic tissue centrally     Lab Results:  Recent Labs    07/22/18 0453 07/23/18 0635  WBC 18.0* 16.0*  HGB 10.0* 10.0*  HCT 32.6* 32.6*  PLT 347 343   BMET Recent Labs    07/22/18 0453 07/23/18 0635  NA 137 136  K 3.8 3.5  CL 105 103  CO2 26 26  GLUCOSE 132* 117*  BUN 21* 21*  CREATININE 0.60 0.56  CALCIUM 8.2* 8.2*   PT/INR No results for input(s): LABPROT, INR in the last 72 hours.  Studies/Results: Dg Chest Port 1 View  Result Date: 07/22/2018 CLINICAL DATA:  Cough.  Draining wound. EXAM: PORTABLE CHEST 1 VIEW COMPARISON:  July 09, 2009 FINDINGS: The distal tip of the right PICC line is difficult to visualize but likely terminates near the caval atrial junction. Severe scoliotic changes are noted. The cardiomediastinal silhouette is stable. No pneumothorax. No nodules or masses. No focal infiltrates. No other acute abnormalities are identified. IMPRESSION: Severe scoliosis with low lung volumes limit evaluation. The distal tip of the right PICC line is not well seen but likely near the caval atrial junction. No other acute abnormalities. Electronically Signed   By: Dorise Bullion III M.D   On:  07/22/2018 15:37    Anti-infectives: Anti-infectives (From admission, onward)   Start     Dose/Rate Route Frequency Ordered Stop   07/21/18 1300  vancomycin (VANCOCIN) IVPB 750 mg/150 ml premix     750 mg 150 mL/hr over 60 Minutes Intravenous Every 12 hours 07/21/18 0832     07/21/18 0600  piperacillin-tazobactam (ZOSYN) IVPB 3.375 g     3.375 g 12.5 mL/hr over 240 Minutes Intravenous Every 8 hours 07/20/18 2218     07/20/18 2230  vancomycin (VANCOCIN) IVPB 1000 mg/200 mL premix     1,000 mg 200 mL/hr over 60 Minutes Intravenous Every 1 hr x 2 07/20/18 2217 07/21/18 0245   07/20/18 2100  vancomycin (VANCOCIN) IVPB 1000 mg/200 mL premix  Status:  Discontinued     1,000 mg 200 mL/hr over 60 Minutes Intravenous  Once 07/20/18 2053 07/20/18 2218   07/20/18 2100  piperacillin-tazobactam (ZOSYN) IVPB 3.375 g     3.375 g 100 mL/hr over 30 Minutes Intravenous  Once 07/20/18 2053 07/20/18 2246      Assessment/Plan: Ms. Stambaugh is a 58 yo with a left flank decubitus wound stage III. She has tolerated bedside debridement without issue. The wound is looking better. There is still some central necrotic tissue but the patient is having more sensation in this area. Will do santyl to the wound in addition to the dressing changes. RN need to use true Kerlix and pack wound around the undermined edges. Beverlee Nims, today's RN aware and will pass  this along.   Santyl and dressing changes QID for now until the wound is ready for a wound vac, hopefully in a few days    LOS: 3 days    Virl Cagey 07/23/2018

## 2018-07-23 NOTE — Progress Notes (Signed)
Critical lab Vanco 27 per lab. Doctor notified. Elodia Florence RN 07/23/2018 1339

## 2018-07-24 ENCOUNTER — Encounter: Payer: Self-pay | Admitting: Family Medicine

## 2018-07-24 LAB — BASIC METABOLIC PANEL
Anion gap: 10 (ref 5–15)
BUN: 22 mg/dL — ABNORMAL HIGH (ref 6–20)
CO2: 25 mmol/L (ref 22–32)
Calcium: 8.6 mg/dL — ABNORMAL LOW (ref 8.9–10.3)
Chloride: 102 mmol/L (ref 98–111)
Creatinine, Ser: 0.62 mg/dL (ref 0.44–1.00)
GFR calc Af Amer: >60 mL/min (ref >60)
GFR calc non Af Amer: >60 mL/min (ref >60)
Glucose, Bld: 107 mg/dL — ABNORMAL HIGH (ref 70–99)
Potassium: 3.4 mmol/L — ABNORMAL LOW (ref 3.5–5.1)
Sodium: 137 mmol/L (ref 135–145)

## 2018-07-24 LAB — CBC
HCT: 35.7 % — ABNORMAL LOW (ref 36.0–46.0)
Hemoglobin: 10.7 g/dL — ABNORMAL LOW (ref 12.0–15.0)
MCH: 28 pg (ref 26.0–34.0)
MCHC: 30 g/dL (ref 30.0–36.0)
MCV: 93.5 fL (ref 80.0–100.0)
Platelets: 363 10*3/uL (ref 150–400)
RBC: 3.82 MIL/uL — ABNORMAL LOW (ref 3.87–5.11)
RDW: 14.5 % (ref 11.5–15.5)
WBC: 14.5 10*3/uL — ABNORMAL HIGH (ref 4.0–10.5)
nRBC: 0 % (ref 0.0–0.2)

## 2018-07-24 LAB — MAGNESIUM: Magnesium: 1.7 mg/dL (ref 1.7–2.4)

## 2018-07-24 MED ORDER — MAGNESIUM CITRATE PO SOLN
1.0000 | Freq: Once | ORAL | Status: AC
  Administered 2018-07-24: 1 via ORAL
  Filled 2018-07-24: qty 296

## 2018-07-24 MED ORDER — POTASSIUM CHLORIDE CRYS ER 20 MEQ PO TBCR
40.0000 meq | EXTENDED_RELEASE_TABLET | Freq: Once | ORAL | Status: AC
  Administered 2018-07-24: 40 meq via ORAL
  Filled 2018-07-24: qty 2

## 2018-07-24 MED ORDER — SODIUM CHLORIDE 0.9 % IV SOLN
1.0000 g | INTRAVENOUS | Status: DC
  Administered 2018-07-24 – 2018-07-26 (×3): 1 g via INTRAVENOUS
  Filled 2018-07-24 (×3): qty 10

## 2018-07-24 NOTE — Progress Notes (Signed)
PROGRESS NOTE    April Watson  UUV:[id number] DOB: [birth year] DOA: 07/20/2018 PCP: Kerri Perches, MD   Brief Narrative:  Per HPI: April Watson a 58 y.o.femalewith medical history significant ofhistory of high-grade rectum dysplasia, chronic constipation, congenital hydrocephalus, MR, GERD, hyperlipidemia, hypertension, seizure disorder who was sent by Dr.Simpsonfor evaluation of abdominal distention for several daysandprogressively worseleft flank/lower backwound-started very small back in March, but now has become a lot more extensive and has been draining malodorous fluid since about a week.No fever, chills, nausea or vomiting per sister. She has chronic constipation, but has been having a small bowel movements. She has been following with wound care.  Patient was admitted with sepsis secondary to cellulitis and multiple draining wounds to her back along with abdominal distention with suspected ileus secondary to chronic constipation and electrolyte abnormalities with hypokalemia.  She has undergone debridement and drainage of her infected left flank decubitus wound with abscess on 7/2 and remains on vancomycin and Zosyn. She has been seen by physical therapy with recommendations for SNF placement on discharge,the family would like to go home with home health when ready.  Assessment & Plan:   Principal Problem:   Sepsis due to cellulitis [facility]) Active Problems:   Hyperlipidemia LDL goal <100   Essential hypertension   GERD   Chronic constipation   Type 2 diabetes mellitus with neurological complications (HCC)   Decubitus ulcer of sacral region, stage 2 (HCC)   Quadriplegia and quadriparesis (HCC)   Abdominal distension   Seizure disorder (HCC)   Decubitus ulcer of back, stage 3 (HCC)   Left ischial pressure sore, stage II (HCC)   Abscess   Sepsis due to cellulitiswith draining wounds Patient noted to have gram-negative rods and gram-positive cocci on  Gram stain of draining fluid-need further ID and sensitivities Continue vancomycin and Zosyn empirically Blood cultures with no growth noted in last 4 days Repeat a.m. CBC  Active Problems: Abdominal distension-soft Secondary to chronic constipation with electrolyte abnormalities. Potassium to be administered orally for 1 dose today We will try magnesium citrate today Appears to be tolerating diet Discontinue IV fluid  Cough Will obtain chest x-ray to evaluate Mucinex ordered Loratadine started  Chronic constipation Start on MiraLAX daily Potassium almost repleted magnesium is 1.7 We will try magnesium citrate today  Hyperlipidemia LDL goal <100 Continue simvastatin.  Essential hypertension Hold blood pressure agentsfor now until blood pressure elevates  GERD Continue Protonix.  Type 2 diabetes mellitus with neurological complications (HCC) Diet controlled. CBG monitoring regular insulin sliding scale.  Quadriplegia and quadriparesiswith congenital hydrocephalus Supportive care.  Seizure disorder (HCC) Continue phenobarbital 32.4 p.o. twice daily.   DVT prophylaxis:Lovenox Code Status:Full Family Communication:Sister at bedside Disposition Plan:Appreciate general surgery for wound debridement.  Will likely go home with home health and wound VAC when ready for discharge in the next 2 to 3 days.  We will give magnesium citrate today.  Consultants:  General surgery  Procedures:  7/2 debridement and drainage of left flank stage III decubitus wound  7/4 further debridement as noted  Antimicrobials:  Vancomycin and Zosyn 7/1->  Subjective: Patient seen and evaluated today with no new acute complaints or concerns. No acute concerns or events noted overnight.  She has done well overnight, but no bowel movement noted yet.  She is tolerating her diet without any issues.  Objective: Vitals:   07/23/18 1312 07/23/18 1951  07/23/18 2111 07/24/18 0556  BP: 119/62  123/69 109/64  Pulse: 91 (!) 107  99 87  Resp: 17 16 16 18   Temp: 98.4 F (36.9 C)  97.7 F (36.5 C) 98.9 F (37.2 C)  TempSrc: Oral  Oral Oral  SpO2: 98% 97% 96% 98%  Weight:      Height:        Intake/Output Summary (Last 24 hours) at 07/24/2018 1037 Last data filed at 07/24/2018 1023 Gross per 24 hour  Intake 320 ml  Output 1050 ml  Net -730 ml   Filed Weights   07/20/18 1653 07/21/18 0700  Weight: 72.6 kg 71.2 kg    Examination:  General exam: Appears calm and comfortable  Respiratory system: Clear to auscultation. Respiratory effort normal. Cardiovascular system: S1 & S2 heard, RRR. No JVD, murmurs, rubs, gallops or clicks. No pedal edema. Gastrointestinal system: Abdomen is minimally distended, soft and nontender. No organomegaly or masses felt. Normal bowel sounds heard. Central nervous system: Alert and awake. Extremities: Symmetric 5 x 5 power. Skin: No rashes, lesions or ulcers, wound evaluated later today Psychiatry: Appears at baseline.    Data Reviewed: I have personally reviewed following labs and imaging studies  CBC: Recent Labs  Lab 07/20/18 1945 07/21/18 0606 07/22/18 0453 07/23/18 0635 07/24/18 0619  WBC 19.2* 20.5* 18.0* 16.0* 14.5*  NEUTROABS 14.6* 16.2*  --   --   --   HGB 11.2* 10.3* 10.0* 10.0* 10.7*  HCT 35.6* 32.6* 32.6* 32.6* 35.7*  MCV 90.4 91.8 94.2 93.7 93.5  PLT 132* 292 347 343 363   Basic Metabolic Panel: Recent Labs  Lab 07/20/18 1945 07/20/18 2140 07/21/18 0644 07/22/18 0453 07/23/18 0635 07/24/18 0619  NA 138  --  140 137 136 137  K 2.5*  --  2.4* 3.8 3.5 3.4*  CL 95*  --  99 105 103 102  CO2 28  --  27 26 26 25   GLUCOSE 147*  --  172* 132* 117* 107*  BUN 29*  --  25* 21* 21* 22*  CREATININE 0.62  --  0.68 0.60 0.56 0.62  CALCIUM 8.6*  --  8.4* 8.2* 8.2* 8.6*  MG  --  1.8  --  2.0  --  1.7  PHOS  --  2.9  --   --   --   --    GFR: Estimated Creatinine Clearance: 72.5  mL/min (by C-G formula based on SCr of 0.62 mg/dL). Liver Function Tests: Recent Labs  Lab 07/20/18 1945  AST 18  ALT 16  ALKPHOS 74  BILITOT 0.8  PROT 6.0*  ALBUMIN 2.2*   No results for input(s): LIPASE, AMYLASE in the last 168 hours. No results for input(s): AMMONIA in the last 168 hours. Coagulation Profile: No results for input(s): INR, PROTIME in the last 168 hours. Cardiac Enzymes: No results for input(s): CKTOTAL, CKMB, CKMBINDEX, TROPONINI in the last 168 hours. BNP (last 3 results) No results for input(s): PROBNP in the last 8760 hours. HbA1C: No results for input(s): HGBA1C in the last 72 hours. CBG: No results for input(s): GLUCAP in the last 168 hours. Lipid Profile: No results for input(s): CHOL, HDL, LDLCALC, TRIG, CHOLHDL, LDLDIRECT in the last 72 hours. Thyroid Function Tests: No results for input(s): TSH, T4TOTAL, FREET4, T3FREE, THYROIDAB in the last 72 hours. Anemia Panel: No results for input(s): VITAMINB12, FOLATE, FERRITIN, TIBC, IRON, RETICCTPCT in the last 72 hours. Sepsis Labs: Recent Labs  Lab 07/20/18 1945 07/21/18 0752 07/22/18 0453  LATICACIDVEN 1.4 1.1 1.0    Recent Results (from the past 240 hour(s))  Wound  or Superficial Culture     Status: None (Preliminary result)   Collection Time: 07/20/18  5:00 PM   Specimen: Skin, Cyst/Tag/Debridement; Wound  Result Value Ref Range Status   Specimen Description SKIN BIOPSY  Final   Special Requests Normal  Final   Gram Stain   Final    FEW WBC PRESENT, PREDOMINANTLY PMN ABUNDANT GRAM POSITIVE COCCI ABUNDANT GRAM NEGATIVE RODS FEW GRAM POSITIVE RODS    Culture   Final    FEW PROTEUS MIRABILIS CULTURE REINCUBATED FOR BETTER GROWTH    Report Status PENDING  Incomplete   Organism ID, Bacteria PROTEUS MIRABILIS  Final      Susceptibility   Proteus mirabilis - MIC*    AMPICILLIN <=2 SENSITIVE Sensitive     CEFAZOLIN <=4 SENSITIVE Sensitive     CEFEPIME <=1 SENSITIVE Sensitive      CEFTAZIDIME <=1 SENSITIVE Sensitive     CEFTRIAXONE <=1 SENSITIVE Sensitive     CIPROFLOXACIN <=0.25 SENSITIVE Sensitive     GENTAMICIN <=1 SENSITIVE Sensitive     IMIPENEM 8 INTERMEDIATE Intermediate     TRIMETH/SULFA <=20 SENSITIVE Sensitive     AMPICILLIN/SULBACTAM <=2 SENSITIVE Sensitive     PIP/TAZO Value in next row Sensitive      <=4 SENSITIVEPerformed at [facility], [address], [place], [place] [address]    * FEW PROTEUS MIRABILIS  SARS Coronavirus 2 (CEPHEID - Performed in [facility] hospital lab), Hosp Order     Status: None   Collection Time: 07/20/18  5:02 PM   Specimen: Skin Biopsy; Nasopharyngeal  Result Value Ref Range Status   SARS Coronavirus 2 NEGATIVE NEGATIVE Final    Comment: (NOTE) If result is NEGATIVE SARS-CoV-2 target nucleic acids are NOT DETECTED. The SARS-CoV-2 RNA is generally detectable in upper and lower  respiratory specimens during the acute phase of infection. The lowest  concentration of SARS-CoV-2 viral copies this assay can detect is 250  copies / mL. A negative result does not preclude SARS-CoV-2 infection  and should not be used as the sole basis for treatment or other  patient management decisions.  A negative result may occur with  improper specimen collection / handling, submission of specimen other  than nasopharyngeal swab, presence of viral mutation(s) within the  areas targeted by this assay, and inadequate number of viral copies  (<250 copies / mL). A negative result must be combined with clinical  observations, patient history, and epidemiological information. If result is POSITIVE SARS-CoV-2 target nucleic acids are DETECTED. The SARS-CoV-2 RNA is generally detectable in upper and lower  respiratory specimens dur ing the acute phase of infection.  Positive  results are indicative of active infection with SARS-CoV-2.  Clinical  correlation with patient history and other diagnostic information is  necessary to determine  patient infection status.  Positive results do  not rule out bacterial infection or co-infection with other viruses. If result is PRESUMPTIVE POSTIVE SARS-CoV-2 nucleic acids MAY BE PRESENT.   A presumptive positive result was obtained on the submitted specimen  and confirmed on repeat testing.  While 2019 novel coronavirus  (SARS-CoV-2) nucleic acids may be present in the submitted sample  additional confirmatory testing may be necessary for epidemiological  and / or clinical management purposes  to differentiate between  SARS-CoV-2 and other Sarbecovirus currently known to infect humans.  If clinically indicated additional testing with an alternate test  methodology [phone]) is advised. The SARS-CoV-2 RNA is generally  detectable in upper and lower respiratory  sp ecimens during the acute  phase of infection. The expected result is Negative. Fact Sheet for Patients:  BoilerBrush.com.cy Fact Sheet for Healthcare Providers: https://pope.com/ This test is not yet approved or cleared by the [place] FDA and has been authorized for detection and/or diagnosis of SARS-CoV-2 by FDA under an Emergency Use Authorization (EUA).  This EUA will remain in effect (meaning this test can be used) for the duration of the COVID-19 declaration under Section 564(b)(1) of the Act, 21 U.S.C. section 360bbb-3(b)(1), unless the authorization is terminated or revoked sooner. Performed at [facility], [address] [place], [place] [address]   Blood culture (routine x 2)     Status: None (Preliminary result)   Collection Time: 07/20/18  7:45 PM   Specimen: BLOOD  Result Value Ref Range Status   Specimen Description BLOOD FEMORAL ARTERY THIGH  Final   Special Requests   Final    BOTTLES DRAWN AEROBIC AND ANAEROBIC Blood Culture adequate volume   Culture   Final    NO GROWTH 4 DAYS Performed at [facility], [address] [place], [place] [address]      Report Status PENDING  Incomplete  Blood culture (routine x 2)     Status: None (Preliminary result)   Collection Time: 07/20/18  9:40 PM   Specimen: BLOOD  Result Value Ref Range Status   Specimen Description BLOOD PIC LINE  Final   Special Requests   Final    BOTTLES DRAWN AEROBIC AND ANAEROBIC Blood Culture adequate volume   Culture   Final    NO GROWTH 4 DAYS Performed at [facility], [address], [place], [place] [address]    Report Status PENDING  Incomplete  Aerobic/Anaerobic Culture (surgical/deep wound)     Status: None (Preliminary result)   Collection Time: 07/21/18 12:57 PM   Specimen: Back; Abscess  Result Value Ref Range Status   Specimen Description   Final    BACK Performed at [facility], [address] [place], [place] [address]    Special Requests   Final    NONE Performed at [facility], [address], [place], [place] [address]    Gram Stain   Final    MODERATE WBC PRESENT, PREDOMINANTLY PMN ABUNDANT GRAM POSITIVE COCCI IN CLUSTERS ABUNDANT GRAM NEGATIVE RODS FEW GRAM POSITIVE RODS Performed at [facility], [address] [place], [place] [address]    Culture   Final    MODERATE PROTEUS MIRABILIS FEW KLEBSIELLA PNEUMONIAE    Report Status PENDING  Incomplete   Organism ID, Bacteria KLEBSIELLA PNEUMONIAE  Final      Susceptibility   Klebsiella pneumoniae - MIC*    AMPICILLIN >=32 RESISTANT Resistant     CEFAZOLIN 16 SENSITIVE Sensitive     CEFEPIME <=1 SENSITIVE Sensitive     CEFTAZIDIME <=1 SENSITIVE Sensitive     CEFTRIAXONE <=1 SENSITIVE Sensitive     CIPROFLOXACIN <=0.25 SENSITIVE Sensitive     GENTAMICIN <=1 SENSITIVE Sensitive     IMIPENEM <=0.25 SENSITIVE Sensitive     TRIMETH/SULFA <=20 SENSITIVE Sensitive     AMPICILLIN/SULBACTAM >=32 RESISTANT Resistant     PIP/TAZO 64 INTERMEDIATE Intermediate     Extended ESBL NEGATIVE Sensitive     * FEW KLEBSIELLA PNEUMONIAE         Radiology Studies: Dg Chest Port 1  View  Result Date: 07/22/2018 CLINICAL DATA:  Cough.  Draining wound. EXAM: PORTABLE CHEST 1 VIEW COMPARISON:  July 09, 2009 FINDINGS: The  distal tip of the right PICC line is difficult to visualize but likely terminates near the caval atrial junction. Severe scoliotic changes are noted. The cardiomediastinal silhouette is stable. No pneumothorax. No nodules or masses. No focal infiltrates. No other acute abnormalities are identified. IMPRESSION: Severe scoliosis with low lung volumes limit evaluation. The distal tip of the right PICC line is not well seen but likely near the caval atrial junction. No other acute abnormalities. Electronically Signed   By: Gerome Sam III M.D   On: 07/22/2018 15:37        Scheduled Meds:  collagenase   Topical QID   dextromethorphan-guaiFENesin  1 tablet Oral BID   docusate sodium  100 mg Oral BID   enoxaparin (LOVENOX) injection  40 mg Subcutaneous Q24H   loratadine  10 mg Oral Daily   magnesium citrate  1 Bottle Oral Once   montelukast  10 mg Oral QHS   nutrition supplement (JUVEN)  1 packet Oral BID BM   pantoprazole  40 mg Oral Daily   PHENobarbital  32.4 mg Oral BID   polyethylene glycol  17 g Oral q morning - 10a   potassium chloride  40 mEq Oral Once   Ensure Max Protein  11 oz Oral BID   simvastatin  10 mg Oral QHS   sodium chloride flush  10-40 mL Intracatheter Q12H   Continuous Infusions:  piperacillin-tazobactam (ZOSYN)  IV 3.375 g (07/24/18 0648)   vancomycin 750 mg (07/24/18 0542)     LOS: 4 days    Time spent: 30 minutes    Wajiha Versteeg Hoover Brunette, DO [facility] Pager [phone]  If 7PM-7AM, please contact night-coverage [email or web address] Password [facility] 07/24/2018, 10:37 AM

## 2018-07-25 LAB — BASIC METABOLIC PANEL
Anion gap: 7 (ref 5–15)
BUN: 22 mg/dL — ABNORMAL HIGH (ref 6–20)
CO2: 27 mmol/L (ref 22–32)
Calcium: 8.2 mg/dL — ABNORMAL LOW (ref 8.9–10.3)
Chloride: 101 mmol/L (ref 98–111)
Creatinine, Ser: 0.5 mg/dL (ref 0.44–1.00)
GFR calc Af Amer: >60 mL/min (ref >60)
GFR calc non Af Amer: >60 mL/min (ref >60)
Glucose, Bld: 93 mg/dL (ref 70–99)
Potassium: 3.2 mmol/L — ABNORMAL LOW (ref 3.5–5.1)
Sodium: 135 mmol/L (ref 135–145)

## 2018-07-25 LAB — CULTURE, BLOOD (ROUTINE X 2)
Culture: NO GROWTH
Culture: NO GROWTH
Special Requests: ADEQUATE
Special Requests: ADEQUATE

## 2018-07-25 LAB — CBC
HCT: 30.6 % — ABNORMAL LOW (ref 36.0–46.0)
Hemoglobin: 9.6 g/dL — ABNORMAL LOW (ref 12.0–15.0)
MCH: 28.9 pg (ref 26.0–34.0)
MCHC: 31.4 g/dL (ref 30.0–36.0)
MCV: 92.2 fL (ref 80.0–100.0)
Platelets: 319 10*3/uL (ref 150–400)
RBC: 3.32 MIL/uL — ABNORMAL LOW (ref 3.87–5.11)
RDW: 14.6 % (ref 11.5–15.5)
WBC: 10.7 10*3/uL — ABNORMAL HIGH (ref 4.0–10.5)
nRBC: 0 % (ref 0.0–0.2)

## 2018-07-25 LAB — MAGNESIUM: Magnesium: 1.9 mg/dL (ref 1.7–2.4)

## 2018-07-25 MED ORDER — POTASSIUM CHLORIDE CRYS ER 20 MEQ PO TBCR
40.0000 meq | EXTENDED_RELEASE_TABLET | Freq: Two times a day (BID) | ORAL | Status: DC
  Administered 2018-07-25 – 2018-07-27 (×5): 40 meq via ORAL
  Filled 2018-07-25 (×5): qty 2

## 2018-07-25 MED ORDER — BISACODYL 10 MG RE SUPP
10.0000 mg | Freq: Once | RECTAL | Status: AC
  Administered 2018-07-25: 10 mg via RECTAL

## 2018-07-25 MED ORDER — LACTATED RINGERS IV BOLUS
500.0000 mL | Freq: Once | INTRAVENOUS | Status: AC
  Administered 2018-07-25: 500 mL via INTRAVENOUS

## 2018-07-25 MED ORDER — SORBITOL 70 % SOLN
960.0000 mL | TOPICAL_OIL | Freq: Once | ORAL | Status: AC
  Administered 2018-07-25: 960 mL via RECTAL
  Filled 2018-07-25: qty 473

## 2018-07-25 NOTE — Progress Notes (Signed)
Patient requires frequent re-positioning of the body in ways that cannot be achieved with an ordinary bed or wedge pillow, to eliminate pain, reduce pressure, and the head of the bed to be elevated more than 30 degrees most of the time due to quadriplegia.

## 2018-07-25 NOTE — Progress Notes (Signed)
[facility]  Additional debridement of some necrotic tissue deep and skin edges that were necrotic. Left flank wound 17X10 cm (undermining additional 7 cm inferior, 2 cm superior, 2cm medial, and 2cm lateral).  Negative pressure wound vacuum therapy with black sponge. Change MWF. Continuous pressure to 125 mm Hg.  Also patient needs hospital bed with alternating pressure to protect from further development of sores and positioning issues. She prefers her left side, and family attempts wedges and turns, but this is limited by the patient's contracture body habitus with preference to left.   Curlene Labrum, MD [facility] [address] [place], [place] [address] [phone] (office)

## 2018-07-25 NOTE — Care Management Important Message (Signed)
Important Message  Patient Details  Name: NANAKO STOPHER MRN: [id number] Date of Birth: [birth year]   Medicare Important Message Given:  Yes     Tommy Medal 07/25/2018, 10:04 AM

## 2018-07-25 NOTE — Progress Notes (Signed)
PROGRESS NOTE    KARRY BARRILLEAUX  BMW:[id number] DOB: 1960/10/03 DOA: 07/20/2018 PCP: Fayrene Helper, MD   Brief Narrative:  Per HPI: April Watson April Watson a 58 y.o.femalewith medical history significant ofhistory of high-grade rectum dysplasia, chronic constipation, congenital hydrocephalus, MR, GERD, hyperlipidemia, hypertension, seizure disorder who was sent by Dr.Simpsonfor evaluation of abdominal distention for several daysandprogressively worseleft flank/lower backwound-started very small back in March, but now has become a lot more extensive and has been draining malodorous fluid since about a week.No fever, chills, nausea or vomiting per sister. She has chronic constipation, but has been having a small bowel movements. She has been following with wound care.  Patient was admitted with sepsis secondary to cellulitis and multiple draining wounds to her back along with abdominal distention with suspected ileus secondary to chronic constipation and electrolyte abnormalities with hypokalemia.  She has undergone debridement and drainage of her infected left flank decubitus wound with abscess on 7/2 and remains on vancomycin and Zosyn. She has been seen by physical therapy with recommendations for SNF placement on discharge,the family would like to go home with home health when ready.  Assessment & Plan:   Principal Problem:   Sepsis due to cellulitis [facility]) Active Problems:   Hyperlipidemia LDL goal <100   Essential hypertension   GERD   Chronic constipation   Type 2 diabetes mellitus with neurological complications (HCC)   Decubitus ulcer of sacral region, stage 2 (HCC)   Quadriplegia and quadriparesis (HCC)   Abdominal distension   Seizure disorder (HCC)   Decubitus ulcer of back, stage 3 (HCC)   Left ischial pressure sore, stage II (HCC)   Abscess   Sepsis due to cellulitiswith draining wounds Klebsiella pneumonia noted in wounds sensitive to Rocephin which  patient has been switched to on 7/5 Continue now on Rocephin with plans to discharge on Omnicef by tomorrow Blood cultures with no growth noted in last5days Repeat a.m. CBC with improving leukocytosis  Active Problems: Abdominal distension-soft Secondary to chronic constipation with electrolyte abnormalities. Potassium to be administered orally for 2 doses today Suppository does not seem to have helped and will try enema Appears to be tolerating diet  Cough Will obtain chest x-ray to evaluate Mucinex ordered Loratadine started  Chronic constipation Start on MiraLAX daily We will try enema if suppositories fail to work  Hyperlipidemia LDL goal <100 Continue simvastatin.  Essential hypertension Hold blood pressure agentsfor now until blood pressure elevates  GERD Continue Protonix.  Type 2 diabetes mellitus with neurological complications (HCC) Diet controlled. CBG monitoring regular insulin sliding scale.  Quadriplegia and quadriparesiswith congenital hydrocephalus Supportive care. Will order alternating pressure bed for home and anticipate discharge by a.m.  Seizure disorder (HCC) Continue phenobarbital 32.4 p.o. twice daily.   DVT prophylaxis:Lovenox Code Status:Full Family Communication:Sister at bedside Disposition Plan:Appreciate general surgery for wound debridement today.  Wound VAC to be applied today and will plan for discharge by a.m. after bowel movement is noted.  Patient will also need set up with home alternating pressure bed on discharge.  Consultants:  General surgery  Procedures:  7/2 debridement and drainage of left flank stage III decubitus wound  7/4 further debridement as noted  7/6 further debridement at bedside with application of wound VAC.  Antimicrobials:  Anti-infectives (From admission, onward)   Start     Dose/Rate Route Frequency Ordered Stop   07/24/18 1130  cefTRIAXone (ROCEPHIN) 1 g in  sodium chloride 0.9 % 100 mL IVPB     1  g 200 mL/hr over 30 Minutes Intravenous Every 24 hours 07/24/18 1103     07/24/18 0600  vancomycin (VANCOCIN) IVPB 750 mg/150 ml premix  Status:  Discontinued     750 mg 150 mL/hr over 60 Minutes Intravenous Every 24 hours 07/23/18 1401 07/24/18 1103   07/21/18 1300  vancomycin (VANCOCIN) IVPB 750 mg/150 ml premix  Status:  Discontinued     750 mg 150 mL/hr over 60 Minutes Intravenous Every 12 hours 07/21/18 0832 07/23/18 1401   07/21/18 0600  piperacillin-tazobactam (ZOSYN) IVPB 3.375 g  Status:  Discontinued     3.375 g 12.5 mL/hr over 240 Minutes Intravenous Every 8 hours 07/20/18 2218 07/24/18 1103   07/20/18 2230  vancomycin (VANCOCIN) IVPB 1000 mg/200 mL premix     1,000 mg 200 mL/hr over 60 Minutes Intravenous Every 1 hr x 2 07/20/18 2217 07/21/18 0245   07/20/18 2100  vancomycin (VANCOCIN) IVPB 1000 mg/200 mL premix  Status:  Discontinued     1,000 mg 200 mL/hr over 60 Minutes Intravenous  Once 07/20/18 2053 07/20/18 2218   07/20/18 2100  piperacillin-tazobactam (ZOSYN) IVPB 3.375 g     3.375 g 100 mL/hr over 30 Minutes Intravenous  Once 07/20/18 2053 07/20/18 2246       Subjective: Patient seen and evaluated today with no new acute complaints or concerns. No acute concerns or events noted overnight.  No bowel movement noted yet.  Patient has been tolerating her diet.  Objective: Vitals:   07/24/18 2000 07/24/18 2109 07/25/18 0601 07/25/18 1409  BP:  115/73 133/72 127/78  Pulse: 100 96 93 100  Resp: 16 18 18 18   Temp:  98.5 F (36.9 C)  98.1 F (36.7 C)  TempSrc:  Oral  Axillary  SpO2: 95% 98% 99% 98%  Weight:      Height:        Intake/Output Summary (Last 24 hours) at 07/25/2018 1422 Last data filed at 07/25/2018 0901 Gross per 24 hour  Intake 180.26 ml  Output 800 ml  Net -619.74 ml   Filed Weights   07/20/18 1653 07/21/18 0700  Weight: 72.6 kg 71.2 kg    Examination:  General exam: Appears calm and comfortable   Respiratory system: Clear to auscultation. Respiratory effort normal. Cardiovascular system: S1 & S2 heard, RRR. No JVD, murmurs, rubs, gallops or clicks. No pedal edema. Gastrointestinal system: Abdomen is moderately distended, soft and nontender. No organomegaly or masses felt. Normal bowel sounds heard. Central nervous system: Alert and awake. Extremities: Symmetric 5 x 5 power. Skin: No rashes, lesions or ulcers, left flank wound examined at bedside with Dr. Constance Haw during debridement with central area of necrosis noted. Psychiatry: Difficult to assess.    Data Reviewed: I have personally reviewed following labs and imaging studies  CBC: Recent Labs  Lab 07/20/18 1945 07/21/18 0606 07/22/18 0453 07/23/18 0635 07/24/18 0619 07/25/18 0456  WBC 19.2* 20.5* 18.0* 16.0* 14.5* 10.7*  NEUTROABS 14.6* 16.2*  --   --   --   --   HGB 11.2* 10.3* 10.0* 10.0* 10.7* 9.6*  HCT 35.6* 32.6* 32.6* 32.6* 35.7* 30.6*  MCV 90.4 91.8 94.2 93.7 93.5 92.2  PLT 132* 292 347 343 363 259   Basic Metabolic Panel: Recent Labs  Lab 07/20/18 2140 07/21/18 0644 07/22/18 0453 07/23/18 0635 07/24/18 0619 07/25/18 0456  NA  --  140 137 136 137 135  K  --  2.4* 3.8 3.5 3.4* 3.2*  CL  --  99 105 103 102  101  CO2  --  27 26 26 25 27   GLUCOSE  --  172* 132* 117* 107* 93  BUN  --  25* 21* 21* 22* 22*  CREATININE  --  0.68 0.60 0.56 0.62 0.50  CALCIUM  --  8.4* 8.2* 8.2* 8.6* 8.2*  MG 1.8  --  2.0  --  1.7 1.9  PHOS 2.9  --   --   --   --   --    GFR: Estimated Creatinine Clearance: 72.5 mL/min (by C-G formula based on SCr of 0.5 mg/dL). Liver Function Tests: Recent Labs  Lab 07/20/18 1945  AST 18  ALT 16  ALKPHOS 74  BILITOT 0.8  PROT 6.0*  ALBUMIN 2.2*   No results for input(s): LIPASE, AMYLASE in the last 168 hours. No results for input(s): AMMONIA in the last 168 hours. Coagulation Profile: No results for input(s): INR, PROTIME in the last 168 hours. Cardiac Enzymes: No results for  input(s): CKTOTAL, CKMB, CKMBINDEX, TROPONINI in the last 168 hours. BNP (last 3 results) No results for input(s): PROBNP in the last 8760 hours. HbA1C: No results for input(s): HGBA1C in the last 72 hours. CBG: No results for input(s): GLUCAP in the last 168 hours. Lipid Profile: No results for input(s): CHOL, HDL, LDLCALC, TRIG, CHOLHDL, LDLDIRECT in the last 72 hours. Thyroid Function Tests: No results for input(s): TSH, T4TOTAL, FREET4, T3FREE, THYROIDAB in the last 72 hours. Anemia Panel: No results for input(s): VITAMINB12, FOLATE, FERRITIN, TIBC, IRON, RETICCTPCT in the last 72 hours. Sepsis Labs: Recent Labs  Lab 07/20/18 1945 07/21/18 0752 07/22/18 0453  LATICACIDVEN 1.4 1.1 1.0    Recent Results (from the past 240 hour(s))  Wound or Superficial Culture     Status: None (Preliminary result)   Collection Time: 07/20/18  5:00 PM   Specimen: Skin, Cyst/Tag/Debridement; Wound  Result Value Ref Range Status   Specimen Description SKIN BIOPSY  Final   Special Requests Normal  Final   Gram Stain   Final    FEW WBC PRESENT, PREDOMINANTLY PMN ABUNDANT GRAM POSITIVE COCCI ABUNDANT GRAM NEGATIVE RODS FEW GRAM POSITIVE RODS    Culture   Final    FEW PROTEUS MIRABILIS CULTURE REINCUBATED FOR BETTER GROWTH    Report Status PENDING  Incomplete   Organism ID, Bacteria PROTEUS MIRABILIS  Final      Susceptibility   Proteus mirabilis - MIC*    AMPICILLIN <=2 SENSITIVE Sensitive     CEFAZOLIN <=4 SENSITIVE Sensitive     CEFEPIME <=1 SENSITIVE Sensitive     CEFTAZIDIME <=1 SENSITIVE Sensitive     CEFTRIAXONE <=1 SENSITIVE Sensitive     CIPROFLOXACIN <=0.25 SENSITIVE Sensitive     GENTAMICIN <=1 SENSITIVE Sensitive     IMIPENEM 8 INTERMEDIATE Intermediate     TRIMETH/SULFA <=20 SENSITIVE Sensitive     AMPICILLIN/SULBACTAM <=2 SENSITIVE Sensitive     PIP/TAZO Value in next row Sensitive      <=4 SENSITIVEPerformed at [facility] [place] [address], [place],   [address]    * FEW PROTEUS MIRABILIS  SARS Coronavirus 2 (CEPHEID - Performed in [place] hospital lab), Hosp Order     Status: None   Collection Time: 07/20/18  5:02 PM   Specimen: Skin Biopsy; Nasopharyngeal  Result Value Ref Range Status   SARS Coronavirus 2 NEGATIVE NEGATIVE Final    Comment: (NOTE) If result is NEGATIVE SARS-CoV-2 target nucleic acids are NOT DETECTED. The SARS-CoV-2 RNA is generally detectable in  upper and lower  respiratory specimens during the acute phase of infection. The lowest  concentration of SARS-CoV-2 viral copies this assay can detect is 250  copies / mL. A negative result does not preclude SARS-CoV-2 infection  and should not be used as the sole basis for treatment or other  patient management decisions.  A negative result may occur with  improper specimen collection / handling, submission of specimen other  than nasopharyngeal swab, presence of viral mutation(s) within the  areas targeted by this assay, and inadequate number of viral copies  (<250 copies / mL). A negative result must be combined with clinical  observations, patient history, and epidemiological information. If result is POSITIVE SARS-CoV-2 target nucleic acids are DETECTED. The SARS-CoV-2 RNA is generally detectable in upper and lower  respiratory specimens dur ing the acute phase of infection.  Positive  results are indicative of active infection with SARS-CoV-2.  Clinical  correlation with patient history and other diagnostic information is  necessary to determine patient infection status.  Positive results do  not rule out bacterial infection or co-infection with other viruses. If result is PRESUMPTIVE POSTIVE SARS-CoV-2 nucleic acids MAY BE PRESENT.   A presumptive positive result was obtained on the submitted specimen  and confirmed on repeat testing.  While 2019 novel coronavirus  (SARS-CoV-2) nucleic acids may be present in the submitted sample  additional confirmatory  testing may be necessary for epidemiological  and / or clinical management purposes  to differentiate between  SARS-CoV-2 and other Sarbecovirus currently known to infect humans.  If clinically indicated additional testing with an alternate test  methodology [phone]) is advised. The SARS-CoV-2 RNA is generally  detectable in upper and lower respiratory sp ecimens during the acute  phase of infection. The expected result is Negative. Fact Sheet for Patients:  StrictlyIdeas.no Fact Sheet for Healthcare Providers: BankingDealers.co.za This test is not yet approved or cleared by the [place] FDA and has been authorized for detection and/or diagnosis of SARS-CoV-2 by FDA under an Emergency Use Authorization (EUA).  This EUA will remain in effect (meaning this test can be used) for the duration of the COVID-19 declaration under Section 564(b)(1) of the Act, 21 U.S.C. section 360bbb-3(b)(1), unless the authorization is terminated or revoked sooner. Performed at [facility], [address] [place], [place] [address]   Blood culture (routine x 2)     Status: None   Collection Time: 07/20/18  7:45 PM   Specimen: BLOOD  Result Value Ref Range Status   Specimen Description BLOOD FEMORAL ARTERY THIGH  Final   Special Requests   Final    BOTTLES DRAWN AEROBIC AND ANAEROBIC Blood Culture adequate volume   Culture   Final    NO GROWTH 5 DAYS Performed at [facility], [address] [place], [place] [address]    Report Status 07/25/2018 FINAL  Final  Blood culture (routine x 2)     Status: None   Collection Time: 07/20/18  9:40 PM   Specimen: BLOOD  Result Value Ref Range Status   Specimen Description BLOOD PIC LINE  Final   Special Requests   Final    BOTTLES DRAWN AEROBIC AND ANAEROBIC Blood Culture adequate volume   Culture   Final    NO GROWTH 5 DAYS Performed at [facility], [address], [place], [place] [address]    Report  Status 07/25/2018 FINAL  Final  Aerobic/Anaerobic Culture (surgical/deep wound)     Status: None (Preliminary result)   Collection  Time: 07/21/18 12:57 PM   Specimen: Back; Abscess  Result Value Ref Range Status   Specimen Description BACK  Final   Special Requests NONE  Final   Gram Stain   Final    MODERATE WBC PRESENT, PREDOMINANTLY PMN ABUNDANT GRAM POSITIVE COCCI IN CLUSTERS ABUNDANT GRAM NEGATIVE RODS FEW GRAM POSITIVE RODS    Culture   Final    MODERATE PROTEUS MIRABILIS FEW KLEBSIELLA PNEUMONIAE HOLDING FOR POSSIBLE ANAEROBE CULTURE REINCUBATED FOR BETTER GROWTH    Report Status PENDING  Incomplete   Organism ID, Bacteria KLEBSIELLA PNEUMONIAE  Final   Organism ID, Bacteria PROTEUS MIRABILIS  Final      Susceptibility   Klebsiella pneumoniae - MIC*    AMPICILLIN >=32 RESISTANT Resistant     CEFAZOLIN 16 SENSITIVE Sensitive     CEFEPIME <=1 SENSITIVE Sensitive     CEFTAZIDIME <=1 SENSITIVE Sensitive     CEFTRIAXONE <=1 SENSITIVE Sensitive     CIPROFLOXACIN <=0.25 SENSITIVE Sensitive     GENTAMICIN <=1 SENSITIVE Sensitive     IMIPENEM <=0.25 SENSITIVE Sensitive     TRIMETH/SULFA <=20 SENSITIVE Sensitive     AMPICILLIN/SULBACTAM >=32 RESISTANT Resistant     PIP/TAZO 64 INTERMEDIATE Intermediate     Extended ESBL NEGATIVE Sensitive     * FEW KLEBSIELLA PNEUMONIAE   Proteus mirabilis - MIC*    AMPICILLIN <=2 SENSITIVE Sensitive     CEFAZOLIN <=4 SENSITIVE Sensitive     CEFEPIME <=1 SENSITIVE Sensitive     CEFTAZIDIME <=1 SENSITIVE Sensitive     CEFTRIAXONE <=1 SENSITIVE Sensitive     CIPROFLOXACIN <=0.25 SENSITIVE Sensitive     GENTAMICIN <=1 SENSITIVE Sensitive     IMIPENEM 2 SENSITIVE Sensitive     TRIMETH/SULFA <=20 SENSITIVE Sensitive     AMPICILLIN/SULBACTAM <=2 SENSITIVE Sensitive     PIP/TAZO Value in next row Sensitive      <=4 SENSITIVEPerformed at [facility], [address] [place], [place] [address]    * MODERATE PROTEUS MIRABILIS          Radiology Studies: No results found.      Scheduled Meds: . dextromethorphan-guaiFENesin  1 tablet Oral BID  . docusate sodium  100 mg Oral BID  . enoxaparin (LOVENOX) injection  40 mg Subcutaneous Q24H  . loratadine  10 mg Oral Daily  . montelukast  10 mg Oral QHS  . nutrition supplement (JUVEN)  1 packet Oral BID BM  . pantoprazole  40 mg Oral Daily  . PHENobarbital  32.4 mg Oral BID  . polyethylene glycol  17 g Oral q morning - 10a  . potassium chloride  40 mEq Oral BID  . Ensure Max Protein  11 oz Oral BID  . simvastatin  10 mg Oral QHS  . sodium chloride flush  10-40 mL Intracatheter Q12H  . sorbitol, milk of mag, mineral oil, glycerin (SMOG) enema  960 mL Rectal Once   Continuous Infusions: . cefTRIAXone (ROCEPHIN)  IV 1 g (07/25/18 0953)     LOS: 5 days    Time spent: 30 minutes    Pratik Darleen Crocker, DO [facility] Pager [phone]  If 7PM-7AM, please contact night-coverage [email or web address] Password [email or web address] 07/25/2018, 2:22 PM

## 2018-07-25 NOTE — Progress Notes (Signed)
Physical Therapy Treatment Patient Details Name: April Watson MRN: [id number] DOB: September 17, 1960 Today's Date: 07/25/2018    History of Present Illness April Watson is a 58 y.o. female with medical history significant of history of high-grade rectum dysplasia, chronic constipation, congenital hydrocephalus, MR, GERD, hyperlipidemia, hypertension, seizure disorder who was sent by Dr. Moshe Cipro for evaluation of abdominal distention for several days and progressively worse left flank/lower back wound-started very small back in March, but now has become a lot more extensive and has been draining malodorous fluid since about a week.  No fever, chills, nausea or vomiting per sister.  She has chronic constipation, but has been having a small bowel movements.  She has been following with wound care.    PT Comments    Patient presents with her sister present in room and cooperative with therapy.  Patient received PROM and some AAROM all planes of motion to BUE/LE  extremities  X 10 repetitions each without facial expressions of pain.  Patient's sister instructed in providing ROM to extremities to end feel available ROM and how to monitor patient expressions and movement for discomfort with understanding acknowledged.  Patient will benefit from continued physical therapy in hospital and recommended venue below for family training in order to avoid worsening of contractures and left flankl/lower back pressure wound.   Follow Up Recommendations  Home health PT;Supervision for mobility/OOB;Supervision/Assistance - 24 hour     Equipment Recommendations  None recommended by PT    Recommendations for Other Services       Precautions / Restrictions Precautions Precautions: Fall Restrictions Weight Bearing Restrictions: No    Mobility  Bed Mobility Overal bed mobility: Needs Assistance   Rolling: Total assist         General bed mobility comments: Pt's baseline is dependent for bed  mobility.  Transfers                    Ambulation/Gait                 Stairs             Wheelchair Mobility    Modified Rankin (Stroke Patients Only)       Balance                                            Cognition Arousal/Alertness: Awake/alert Behavior During Therapy: Flat affect Overall Cognitive Status: History of cognitive impairments - at baseline                                        Exercises Other Exercises Other Exercises: Patient seen for AROM to all extremities all planes of motion x 10 reps each    General Comments        Pertinent Vitals/Pain Pain Assessment: Faces Faces Pain Scale: No hurt    Home Living                      Prior Function            PT Goals (current goals can now be found in the care plan section) Acute Rehab PT Goals Patient Stated Goal: Return home once wounds have healed and back to baseline. PT Goal Formulation: With family Time For Goal  Achievement: 08/05/18 Potential to Achieve Goals: Fair Additional Goals Additional Goal #1: Pt will tolerate remaining up in chair for 2-3 hr periods without pain and in proper positioning to continue pressure relief. Progress towards PT goals: Progressing toward goals    Frequency    Min 2X/week      PT Plan Current plan remains appropriate    Co-evaluation              AM-PAC PT "6 Clicks" Mobility   Outcome Measure  Help needed turning from your back to your side while in a flat bed without using bedrails?: Total Help needed moving from lying on your back to sitting on the side of a flat bed without using bedrails?: Total Help needed moving to and from a bed to a chair (including a wheelchair)?: Total Help needed standing up from a chair using your arms (e.g., wheelchair or bedside chair)?: Total Help needed to walk in hospital room?: Total Help needed climbing 3-5 steps with a railing? :  Total 6 Click Score: 6    End of Session   Activity Tolerance: Patient tolerated treatment well Patient left: in bed;with call bell/phone within reach;with family/visitor present Nurse Communication: Mobility status PT Visit Diagnosis: Other abnormalities of gait and mobility (R26.89);Muscle weakness (generalized) (M62.81)     Time: 0355-9741 PT Time Calculation (min) (ACUTE ONLY): 22 min  Charges:  $Therapeutic Exercise: 8-22 mins                     3:25 PM, 07/25/18 Lonell Grandchild, MPT Physical Therapist with [facility] 336 [phone] office [phone] mobile phone

## 2018-07-25 NOTE — TOC Progression Note (Addendum)
Transition of Care [facility]) - Progression Note    Patient Details  Name: April Watson MRN: [id number] Date of Birth: 07-19-1960  [facility]) CM/SW Contact  Shade Flood, LCSW Phone Number: 07/25/2018, 3:01 PM  Clinical Narrative:     TOC following. Per MD, pt will be stable for dc home tomorrow. Spoke with pt's sister about DME and HH. Plan remains for [facility] to provide [facility] RN care after dc. Spoke with Tommi Rumps at [facility] to update. They are prepared to admit pt on Wednesday and will start wound vac dressing changes at that time per Dr. Constance Haw instruction. Pt's sister has decided she will keep the hospital bed they currently have at home. Per Dr. Constance Haw, the APP pad/pump would be the best option for pt among the choices covered by pt's insurance. Have ordered the APP from [place] at sister's request. Wound vac ordered from [employer]. Pt will need EMS transport home.   TOC will follow up tomorrow to further assist with dc needs.  Expected Discharge Plan: [place] Barriers to Discharge: No Barriers Identified  Expected Discharge Plan and Services Expected Discharge Plan: [place] In-house Referral: Clinical Social Work Discharge Planning Services: CM Consult Post Acute Care Choice: Home Health, Durable Medical Equipment Living arrangements for the past 2 months: [place] Expected Discharge Date: 07/26/18               DME Arranged: Gel overlay, Negative pressure wound device         HH Arranged: RN, PT HH Agency: [place] [facility] Agency Contacted: 07/22/18 Time [place]: [id number] Representative spoke with at [place]: [place] ([place]) Interventions    Readmission Risk Interventions No flowsheet data found.

## 2018-07-26 LAB — CBC
HCT: 34.8 % — ABNORMAL LOW (ref 36.0–46.0)
Hemoglobin: 10.7 g/dL — ABNORMAL LOW (ref 12.0–15.0)
MCH: 28.5 pg (ref 26.0–34.0)
MCHC: 30.7 g/dL (ref 30.0–36.0)
MCV: 92.8 fL (ref 80.0–100.0)
Platelets: 322 10*3/uL (ref 150–400)
RBC: 3.75 MIL/uL — ABNORMAL LOW (ref 3.87–5.11)
RDW: 14.7 % (ref 11.5–15.5)
WBC: 20.2 10*3/uL — ABNORMAL HIGH (ref 4.0–10.5)
nRBC: 0 % (ref 0.0–0.2)

## 2018-07-26 LAB — AEROBIC CULTURE W GRAM STAIN (SUPERFICIAL SPECIMEN): Special Requests: NORMAL

## 2018-07-26 LAB — BASIC METABOLIC PANEL
Anion gap: 9 (ref 5–15)
BUN: 22 mg/dL — ABNORMAL HIGH (ref 6–20)
CO2: 27 mmol/L (ref 22–32)
Calcium: 8.2 mg/dL — ABNORMAL LOW (ref 8.9–10.3)
Chloride: 102 mmol/L (ref 98–111)
Creatinine, Ser: 0.49 mg/dL (ref 0.44–1.00)
GFR calc Af Amer: >60 mL/min (ref >60)
GFR calc non Af Amer: >60 mL/min (ref >60)
Glucose, Bld: 207 mg/dL — ABNORMAL HIGH (ref 70–99)
Potassium: 4.5 mmol/L (ref 3.5–5.1)
Sodium: 138 mmol/L (ref 135–145)

## 2018-07-26 MED ORDER — METRONIDAZOLE 500 MG PO TABS
500.0000 mg | ORAL_TABLET | Freq: Three times a day (TID) | ORAL | Status: DC
  Administered 2018-07-26 – 2018-07-27 (×3): 500 mg via ORAL
  Filled 2018-07-26 (×4): qty 1

## 2018-07-26 MED ORDER — METRONIDAZOLE IN NACL 5-0.79 MG/ML-% IV SOLN
500.0000 mg | Freq: Once | INTRAVENOUS | Status: AC
  Administered 2018-07-26: 500 mg via INTRAVENOUS
  Filled 2018-07-26: qty 100

## 2018-07-26 NOTE — TOC Progression Note (Addendum)
Transition of Care [facility]) - Progression Note    Patient Details  Name: April Watson MRN: [id number] Date of Birth: October 08, 1960  Transition of Care [facility]) CM/SW Contact  Ihor Gully, LCSW Phone Number: 07/26/2018, 6:04 PM  Clinical Narrative:    Patient's wound vac from [employer] has been delivered to room. Patient has [facility] wound vac on currently. Dr. Constance Haw plans on switching patient's wound vac to [employer] wound vac so that she can discharge home on 07/27/2018. APP mattress has been delivered to the home for patient's hospital bed. Cory at [employer] advised of expected d/c 07/27/2018. Expected d/c 07/27/2018 via EMS.    Expected Discharge Plan: [place] Barriers to Discharge: No Barriers Identified  Expected Discharge Plan and Services Expected Discharge Plan: [place] In-house Referral: Clinical Social Work Discharge Planning Services: CM Consult Post Acute Care Choice: Home Health, Durable Medical Equipment Living arrangements for the past 2 months: [place] Expected Discharge Date: 07/26/18               DME Arranged: Gel overlay, Negative pressure wound device         HH Arranged: RN, PT HH Agency: [place] Date [facility] Agency Contacted: 07/22/18 Time [place]: 8841 Representative spoke with at [place]: [place] ([place]) Interventions    Readmission Risk Interventions Readmission Risk Prevention Plan 07/26/2018 07/25/2018  Transportation Screening Complete Complete  PCP or Specialist Appt within 5-7 Days Complete -  Home Care Screening Complete Complete  Medication Review (RN CM) Complete -  Some recent data might be hidden

## 2018-07-26 NOTE — Progress Notes (Signed)
PROGRESS NOTE    April Watson  ZOX:[id number] DOB: May 25, 1960 DOA: 07/20/2018 PCP: Kerri Perches, MD   Brief Narrative:  Per HPI: April Elbert Ewings Mooreis a 58 y.o.femalewith medical history significant ofhistory of high-grade rectum dysplasia, chronic constipation, congenital hydrocephalus, MR, GERD, hyperlipidemia, hypertension, seizure disorder who was sent by Dr.Simpsonfor evaluation of abdominal distention for several daysandprogressively worseleft flank/lower backwound-started very small back in March, but now has become a lot more extensive and has been draining malodorous fluid since about a week.No fever, chills, nausea or vomiting per sister. She has chronic constipation, but has been having a small bowel movements. She has been following with wound care.  Patient was admitted with sepsis secondary to cellulitis and multiple draining wounds to her back along with abdominal distention with suspected ileus secondary to chronic constipation and electrolyte abnormalities with hypokalemia.  She has undergone debridement and drainage of her infected left flank decubitus wound with abscess on 7/2 and remains on vancomycin and Zosyn. She has been seen by physical therapy with recommendations for SNF placement on discharge,the family would like to go home with home health when ready.  On 7/7 she did have a wound VAC placed by general surgery.  Unfortunately she is noted to have worsening leukocytosis and mild fever overnight.  Wound cultures did show growth of Bacteroides as well for which we have started Flagyl on top of Rocephin today.   Assessment & Plan:   Principal Problem:   Sepsis due to cellulitis [facility]) Active Problems:   Hyperlipidemia LDL goal <100   Essential hypertension   GERD   Chronic constipation   Type 2 diabetes mellitus with neurological complications (HCC)   Decubitus ulcer of sacral region, stage 2 (HCC)   Quadriplegia and quadriparesis (HCC)  Abdominal distension   Seizure disorder (HCC)   Decubitus ulcer of back, stage 3 (HCC)   Left ischial pressure sore, stage II (HCC)   Abscess   Sepsis due to cellulitiswith draining wounds with worsening leukocytosis Klebsiella pneumonia noted in wounds sensitive to Rocephin which patient has been switched to on 7/5 Added Flagyl today due to presence of Bacteroides as well Continue now on Rocephin with plans to discharge on Omnicef by tomorrow Blood cultures with no growth noted in last5days Repeat a.m. CBC  and discharge if improving or stable leukocytosis noted  Active Problems: Abdominal distension-soft Secondary to chronic constipation with electrolyte abnormalities. Enema has helped with constipation Appears to be tolerating diet  Cough Chest x-ray with no sign of pneumonia Mucinex ordered Loratadine started  Chronic constipation Continue MiraLAX daily Enema given with bowel movement on 7/6  Hyperlipidemia LDL goal <100 Continue simvastatin.  Essential hypertension Hold blood pressure agentsfor now until blood pressure elevates  GERD Continue Protonix.  Type 2 diabetes mellitus with neurological complications (HCC) Diet controlled. CBG monitoring regular insulin sliding scale.  Quadriplegia and quadriparesiswith congenital hydrocephalus Supportive care. Will order alternating pressure bed for home and anticipate discharge by a.m.  Seizure disorder (HCC) Continue phenobarbital 32.4 p.o. twice daily.   DVT prophylaxis:Lovenox Code Status:Full Family Communication:Sister at bedside Disposition Plan:Appreciate general surgery for wound VAC placement today.  Discharge by a.m. if leukocytosis improved.  Started on Flagyl for additional bacterial coverage to wound.  Consultants:  General surgery  Procedures:  7/2 debridement and drainage of left flank stage III decubitus wound  7/4 further debridement as noted  7/6  further debridement at bedside   7/7 application of wound VAC  Antimicrobials:  Anti-infectives (From admission,  onward)   Start     Dose/Rate Route Frequency Ordered Stop   07/26/18 1800  metroNIDAZOLE (FLAGYL) tablet 500 mg     500 mg Oral Every 8 hours 07/26/18 1018     07/26/18 1030  metroNIDAZOLE (FLAGYL) IVPB 500 mg     500 mg 100 mL/hr over 60 Minutes Intravenous  Once 07/26/18 1018 07/26/18 1408   07/24/18 1130  cefTRIAXone (ROCEPHIN) 1 g in sodium chloride 0.9 % 100 mL IVPB     1 g 200 mL/hr over 30 Minutes Intravenous Every 24 hours 07/24/18 1103     07/24/18 0600  vancomycin (VANCOCIN) IVPB 750 mg/150 ml premix  Status:  Discontinued     750 mg 150 mL/hr over 60 Minutes Intravenous Every 24 hours 07/23/18 1401 07/24/18 1103   07/21/18 1300  vancomycin (VANCOCIN) IVPB 750 mg/150 ml premix  Status:  Discontinued     750 mg 150 mL/hr over 60 Minutes Intravenous Every 12 hours 07/21/18 0832 07/23/18 1401   07/21/18 0600  piperacillin-tazobactam (ZOSYN) IVPB 3.375 g  Status:  Discontinued     3.375 g 12.5 mL/hr over 240 Minutes Intravenous Every 8 hours 07/20/18 2218 07/24/18 1103   07/20/18 2230  vancomycin (VANCOCIN) IVPB 1000 mg/200 mL premix     1,000 mg 200 mL/hr over 60 Minutes Intravenous Every 1 hr x 2 07/20/18 2217 07/21/18 0245   07/20/18 2100  vancomycin (VANCOCIN) IVPB 1000 mg/200 mL premix  Status:  Discontinued     1,000 mg 200 mL/hr over 60 Minutes Intravenous  Once 07/20/18 2053 07/20/18 2218   07/20/18 2100  piperacillin-tazobactam (ZOSYN) IVPB 3.375 g     3.375 g 100 mL/hr over 30 Minutes Intravenous  Once 07/20/18 2053 07/20/18 2246       Subjective: Patient seen and evaluated today with no new acute complaints or concerns. No acute concerns or events noted overnight.  She is doing quite well and did have a bowel movement with enema yesterday.  Plans are for wound VAC placement today.  Objective: Vitals:   07/25/18 1409 07/25/18 2143 07/26/18 0037  07/26/18 0546  BP: 127/78 106/79  90/61  Pulse: 100 (!) 133  91  Resp: 18 20  17   Temp: 98.1 F (36.7 C) (!) 100.5 F (38.1 C) 98 F (36.7 C) 98 F (36.7 C)  TempSrc: Axillary Oral Axillary Oral  SpO2: 98% 100%  97%  Weight:      Height:        Intake/Output Summary (Last 24 hours) at 07/26/2018 1518 Last data filed at 07/26/2018 0546 Gross per 24 hour  Intake 502.62 ml  Output 400 ml  Net 102.62 ml   Filed Weights   07/20/18 1653 07/21/18 0700  Weight: 72.6 kg 71.2 kg    Examination:  General exam: Appears calm and comfortable  Respiratory system: Clear to auscultation. Respiratory effort normal. Cardiovascular system: S1 & S2 heard, RRR. No JVD, murmurs, rubs, gallops or clicks. No pedal edema. Gastrointestinal system: Abdomen is minimally distended, soft and nontender. No organomegaly or masses felt. Normal bowel sounds heard. Central nervous system: Alert and awake Extremities: Symmetric 5 x 5 power. Skin: No rashes, lesions or ulcers, wound noted to left flank as prior with no change Psychiatry: Cannot be assessed    Data Reviewed: I have personally reviewed following labs and imaging studies  CBC: Recent Labs  Lab 07/20/18 1945 07/21/18 0606 07/22/18 0453 07/23/18 0635 07/24/18 0619 07/25/18 0456 07/26/18 0416  WBC 19.2* 20.5* 18.0* 16.0*  14.5* 10.7* 20.2*  NEUTROABS 14.6* 16.2*  --   --   --   --   --   HGB 11.2* 10.3* 10.0* 10.0* 10.7* 9.6* 10.7*  HCT 35.6* 32.6* 32.6* 32.6* 35.7* 30.6* 34.8*  MCV 90.4 91.8 94.2 93.7 93.5 92.2 92.8  PLT 132* 292 347 343 363 319 322   Basic Metabolic Panel: Recent Labs  Lab 07/20/18 2140  07/22/18 0453 07/23/18 0635 07/24/18 0619 07/25/18 0456 07/26/18 0416  NA  --    < > 137 136 137 135 138  K  --    < > 3.8 3.5 3.4* 3.2* 4.5  CL  --    < > 105 103 102 101 102  CO2  --    < > 26 26 25 27 27   GLUCOSE  --    < > 132* 117* 107* 93 207*  BUN  --    < > 21* 21* 22* 22* 22*  CREATININE  --    < > 0.60 0.56 0.62  0.50 0.49  CALCIUM  --    < > 8.2* 8.2* 8.6* 8.2* 8.2*  MG 1.8  --  2.0  --  1.7 1.9  --   PHOS 2.9  --   --   --   --   --   --    < > = values in this interval not displayed.   GFR: Estimated Creatinine Clearance: 72.5 mL/min (by C-G formula based on SCr of 0.49 mg/dL). Liver Function Tests: Recent Labs  Lab 07/20/18 1945  AST 18  ALT 16  ALKPHOS 74  BILITOT 0.8  PROT 6.0*  ALBUMIN 2.2*   No results for input(s): LIPASE, AMYLASE in the last 168 hours. No results for input(s): AMMONIA in the last 168 hours. Coagulation Profile: No results for input(s): INR, PROTIME in the last 168 hours. Cardiac Enzymes: No results for input(s): CKTOTAL, CKMB, CKMBINDEX, TROPONINI in the last 168 hours. BNP (last 3 results) No results for input(s): PROBNP in the last 8760 hours. HbA1C: No results for input(s): HGBA1C in the last 72 hours. CBG: No results for input(s): GLUCAP in the last 168 hours. Lipid Profile: No results for input(s): CHOL, HDL, LDLCALC, TRIG, CHOLHDL, LDLDIRECT in the last 72 hours. Thyroid Function Tests: No results for input(s): TSH, T4TOTAL, FREET4, T3FREE, THYROIDAB in the last 72 hours. Anemia Panel: No results for input(s): VITAMINB12, FOLATE, FERRITIN, TIBC, IRON, RETICCTPCT in the last 72 hours. Sepsis Labs: Recent Labs  Lab 07/20/18 1945 07/21/18 0752 07/22/18 0453  LATICACIDVEN 1.4 1.1 1.0    Recent Results (from the past 240 hour(s))  Wound or Superficial Culture     Status: None   Collection Time: 07/20/18  5:00 PM   Specimen: Skin, Cyst/Tag/Debridement; Wound  Result Value Ref Range Status   Specimen Description   Final    SKIN BIOPSY Performed at [facility], [address], [place], [place] [address]    Special Requests   Final    Normal Performed at [facility], [address] [place], [place] [address]    Gram Stain   Final    FEW WBC PRESENT, PREDOMINANTLY PMN ABUNDANT GRAM POSITIVE COCCI ABUNDANT GRAM NEGATIVE RODS FEW GRAM  POSITIVE RODS Performed at [facility], [address] [place], [place] [address]    Culture FEW PROTEUS MIRABILIS FEW ENTEROCOCCUS FAECALIS   Final   Report Status 07/26/2018 FINAL  Final   Organism ID, Bacteria PROTEUS MIRABILIS  Final  Organism ID, Bacteria ENTEROCOCCUS FAECALIS  Final      Susceptibility   Enterococcus faecalis - MIC*    AMPICILLIN <=2 SENSITIVE Sensitive     VANCOMYCIN 1 SENSITIVE Sensitive     GENTAMICIN SYNERGY SENSITIVE Sensitive     * FEW ENTEROCOCCUS FAECALIS   Proteus mirabilis - MIC*    AMPICILLIN <=2 SENSITIVE Sensitive     CEFAZOLIN <=4 SENSITIVE Sensitive     CEFEPIME <=1 SENSITIVE Sensitive     CEFTAZIDIME <=1 SENSITIVE Sensitive     CEFTRIAXONE <=1 SENSITIVE Sensitive     CIPROFLOXACIN <=0.25 SENSITIVE Sensitive     GENTAMICIN <=1 SENSITIVE Sensitive     IMIPENEM 8 INTERMEDIATE Intermediate     TRIMETH/SULFA <=20 SENSITIVE Sensitive     AMPICILLIN/SULBACTAM <=2 SENSITIVE Sensitive     PIP/TAZO <=4 SENSITIVE Sensitive     * FEW PROTEUS MIRABILIS  SARS Coronavirus 2 (CEPHEID - Performed in [facility] hospital lab), Hosp Order     Status: None   Collection Time: 07/20/18  5:02 PM   Specimen: Skin Biopsy; Nasopharyngeal  Result Value Ref Range Status   SARS Coronavirus 2 NEGATIVE NEGATIVE Final    Comment: (NOTE) If result is NEGATIVE SARS-CoV-2 target nucleic acids are NOT DETECTED. The SARS-CoV-2 RNA is generally detectable in upper and lower  respiratory specimens during the acute phase of infection. The lowest  concentration of SARS-CoV-2 viral copies this assay can detect is 250  copies / mL. A negative result does not preclude SARS-CoV-2 infection  and should not be used as the sole basis for treatment or other  patient management decisions.  A negative result may occur with  improper specimen collection / handling, submission of specimen other  than nasopharyngeal swab, presence of viral mutation(s) within the  areas targeted  by this assay, and inadequate number of viral copies  (<250 copies / mL). A negative result must be combined with clinical  observations, patient history, and epidemiological information. If result is POSITIVE SARS-CoV-2 target nucleic acids are DETECTED. The SARS-CoV-2 RNA is generally detectable in upper and lower  respiratory specimens dur ing the acute phase of infection.  Positive  results are indicative of active infection with SARS-CoV-2.  Clinical  correlation with patient history and other diagnostic information is  necessary to determine patient infection status.  Positive results do  not rule out bacterial infection or co-infection with other viruses. If result is PRESUMPTIVE POSTIVE SARS-CoV-2 nucleic acids MAY BE PRESENT.   A presumptive positive result was obtained on the submitted specimen  and confirmed on repeat testing.  While 2019 novel coronavirus  (SARS-CoV-2) nucleic acids may be present in the submitted sample  additional confirmatory testing may be necessary for epidemiological  and / or clinical management purposes  to differentiate between  SARS-CoV-2 and other Sarbecovirus currently known to infect humans.  If clinically indicated additional testing with an alternate test  methodology [phone]) is advised. The SARS-CoV-2 RNA is generally  detectable in upper and lower respiratory sp ecimens during the acute  phase of infection. The expected result is Negative. Fact Sheet for Patients:  BoilerBrush.com.cy Fact Sheet for Healthcare Providers: https://pope.com/ This test is not yet approved or cleared by the [place] FDA and has been authorized for detection and/or diagnosis of SARS-CoV-2 by FDA under an Emergency Use Authorization (EUA).  This EUA will remain in effect (meaning this test can be used) for the duration of the COVID-19 declaration under Section 564(b)(1) of the Act, 21 U.S.C.  section  360bbb-3(b)(1), unless the authorization is terminated or revoked sooner. Performed at [facility], [address], [place], [place] [address]   Blood culture (routine x 2)     Status: None   Collection Time: 07/20/18  7:45 PM   Specimen: BLOOD  Result Value Ref Range Status   Specimen Description BLOOD FEMORAL ARTERY THIGH  Final   Special Requests   Final    BOTTLES DRAWN AEROBIC AND ANAEROBIC Blood Culture adequate volume   Culture   Final    NO GROWTH 5 DAYS Performed at [facility], [address] [place], [place] [address]    Report Status 07/25/2018 FINAL  Final  Blood culture (routine x 2)     Status: None   Collection Time: 07/20/18  9:40 PM   Specimen: BLOOD  Result Value Ref Range Status   Specimen Description BLOOD PIC LINE  Final   Special Requests   Final    BOTTLES DRAWN AEROBIC AND ANAEROBIC Blood Culture adequate volume   Culture   Final    NO GROWTH 5 DAYS Performed at [facility], [address], [place], [place] [address]    Report Status 07/25/2018 FINAL  Final  Aerobic/Anaerobic Culture (surgical/deep wound)     Status: None (Preliminary result)   Collection Time: 07/21/18 12:57 PM   Specimen: Back; Abscess  Result Value Ref Range Status   Specimen Description   Final    BACK Performed at [facility], [address], [place], [place] [address]    Special Requests   Final    NONE Performed at [facility], [address], [place], [place] [address]    Gram Stain   Final    MODERATE WBC PRESENT, PREDOMINANTLY PMN ABUNDANT GRAM POSITIVE COCCI IN CLUSTERS ABUNDANT GRAM NEGATIVE RODS FEW GRAM POSITIVE RODS Performed at [facility], [address] [place], [place] [address]    Culture   Final    MODERATE PROTEUS MIRABILIS FEW KLEBSIELLA PNEUMONIAE MODERATE BACTEROIDES THETAIOTAOMICRON    Report Status PENDING  Incomplete   Organism ID, Bacteria KLEBSIELLA PNEUMONIAE  Final   Organism ID, Bacteria PROTEUS MIRABILIS  Final       Susceptibility   Klebsiella pneumoniae - MIC*    AMPICILLIN >=32 RESISTANT Resistant     CEFAZOLIN 16 SENSITIVE Sensitive     CEFEPIME <=1 SENSITIVE Sensitive     CEFTAZIDIME <=1 SENSITIVE Sensitive     CEFTRIAXONE <=1 SENSITIVE Sensitive     CIPROFLOXACIN <=0.25 SENSITIVE Sensitive     GENTAMICIN <=1 SENSITIVE Sensitive     IMIPENEM <=0.25 SENSITIVE Sensitive     TRIMETH/SULFA <=20 SENSITIVE Sensitive     AMPICILLIN/SULBACTAM >=32 RESISTANT Resistant     PIP/TAZO 64 INTERMEDIATE Intermediate     Extended ESBL NEGATIVE Sensitive     * FEW KLEBSIELLA PNEUMONIAE   Proteus mirabilis - MIC*    AMPICILLIN <=2 SENSITIVE Sensitive     CEFAZOLIN <=4 SENSITIVE Sensitive     CEFEPIME <=1 SENSITIVE Sensitive     CEFTAZIDIME <=1 SENSITIVE Sensitive     CEFTRIAXONE <=1 SENSITIVE Sensitive     CIPROFLOXACIN <=0.25 SENSITIVE Sensitive     GENTAMICIN <=1 SENSITIVE Sensitive     IMIPENEM 2 SENSITIVE Sensitive     TRIMETH/SULFA <=20 SENSITIVE Sensitive     AMPICILLIN/SULBACTAM <=2 SENSITIVE Sensitive     PIP/TAZO <=4 SENSITIVE Sensitive     * MODERATE PROTEUS MIRABILIS         Radiology Studies: No results found.  Scheduled Meds: . dextromethorphan-guaiFENesin  1 tablet Oral BID  . docusate sodium  100 mg Oral BID  . enoxaparin (LOVENOX) injection  40 mg Subcutaneous Q24H  . loratadine  10 mg Oral Daily  . metroNIDAZOLE  500 mg Oral Q8H  . montelukast  10 mg Oral QHS  . nutrition supplement (JUVEN)  1 packet Oral BID BM  . pantoprazole  40 mg Oral Daily  . PHENobarbital  32.4 mg Oral BID  . polyethylene glycol  17 g Oral q morning - 10a  . potassium chloride  40 mEq Oral BID  . Ensure Max Protein  11 oz Oral BID  . simvastatin  10 mg Oral QHS  . sodium chloride flush  10-40 mL Intracatheter Q12H   Continuous Infusions: . cefTRIAXone (ROCEPHIN)  IV 1 g (07/26/18 1308)     LOS: 6 days    Time spent: 30 minutes    Peggy Monk Hoover Brunette, DO [facility] Pager  [phone]  If 7PM-7AM, please contact night-coverage [email or web address] Password [facility] 07/26/2018, 3:18 PM

## 2018-07-26 NOTE — Progress Notes (Signed)
[facility]  Wound vacuum never placed yesterday. I placed at bedside today. No major issues or changes. Hopefully home tomorrow.  BP 90/61 (BP Location: Left Arm)   Pulse 91   Temp 98 F (36.7 C) (Oral)   Resp 17   Ht 5\' 3"  (1.6 m)   Wt 71.2 kg   SpO2 97%   BMI 27.81 kg/m   NAD Normal work breathing Left flank wound with granulation, less fibrinous tissue Vac placed, good seal  Plan for home vac and three times a week changes. If she can be on TThS schedule, I can change her vac in a few weeks in my office. Told her sister to bring supplies when she comes to the office.   Curlene Labrum, MD [facility] [address] [place] [address] [phone] (office)

## 2018-07-26 NOTE — Progress Notes (Signed)
Three watery stools today.  Contacted Dr. Manuella Ghazi

## 2018-07-27 ENCOUNTER — Telehealth: Payer: Self-pay

## 2018-07-27 LAB — BASIC METABOLIC PANEL
Anion gap: 6 (ref 5–15)
BUN: 23 mg/dL — ABNORMAL HIGH (ref 6–20)
CO2: 29 mmol/L (ref 22–32)
Calcium: 8.4 mg/dL — ABNORMAL LOW (ref 8.9–10.3)
Chloride: 103 mmol/L (ref 98–111)
Creatinine, Ser: 0.44 mg/dL (ref 0.44–1.00)
GFR calc Af Amer: >60 mL/min (ref >60)
GFR calc non Af Amer: >60 mL/min (ref >60)
Glucose, Bld: 135 mg/dL — ABNORMAL HIGH (ref 70–99)
Potassium: 4.4 mmol/L (ref 3.5–5.1)
Sodium: 138 mmol/L (ref 135–145)

## 2018-07-27 LAB — CBC
HCT: 32.4 % — ABNORMAL LOW (ref 36.0–46.0)
Hemoglobin: 9.9 g/dL — ABNORMAL LOW (ref 12.0–15.0)
MCH: 28.8 pg (ref 26.0–34.0)
MCHC: 30.6 g/dL (ref 30.0–36.0)
MCV: 94.2 fL (ref 80.0–100.0)
Platelets: 318 10*3/uL (ref 150–400)
RBC: 3.44 MIL/uL — ABNORMAL LOW (ref 3.87–5.11)
RDW: 15.2 % (ref 11.5–15.5)
WBC: 16.5 10*3/uL — ABNORMAL HIGH (ref 4.0–10.5)
nRBC: 0 % (ref 0.0–0.2)

## 2018-07-27 LAB — AEROBIC/ANAEROBIC CULTURE W GRAM STAIN (SURGICAL/DEEP WOUND)

## 2018-07-27 MED ORDER — JUVEN PO PACK: 1.0000 | PACK | Freq: Two times a day (BID) | ORAL | 0 refills | Status: AC

## 2018-07-27 MED ORDER — ENSURE MAX PROTEIN PO LIQD: 11.0000 [oz_av] | Freq: Two times a day (BID) | ORAL | 0 refills | Status: AC

## 2018-07-27 MED ORDER — CEFDINIR 300 MG PO CAPS: 300.0000 mg | ORAL_CAPSULE | Freq: Two times a day (BID) | ORAL | 0 refills | Status: DC

## 2018-07-27 MED ORDER — METRONIDAZOLE 500 MG PO TABS: 500.0000 mg | ORAL_TABLET | Freq: Three times a day (TID) | ORAL | 0 refills | Status: DC

## 2018-07-27 MED ORDER — DM-GUAIFENESIN ER 30-600 MG PO TB12: 1.0000 | ORAL_TABLET | Freq: Two times a day (BID) | ORAL | 0 refills | Status: AC

## 2018-07-27 NOTE — TOC Transition Note (Addendum)
Transition of Care [facility]) - CM/SW Discharge Note   Patient Details  Name: April Watson MRN: [id number] Date of Birth: 09-02-1960  Transition of Care [facility]) CM/SW Contact:  Ihor Gully, LCSW Phone Number: 07/27/2018, 11:22 AM   Clinical Narrative:    Dr. Constance Haw to attach take home DME wound vac to patient today. APP mattress has been delivered to home. [name] PT RN arranged. RN to call EMS once wound vac has been switched. Sister/guardian, Lindwood Qua, is aware and agreeable to plan.    Final next level of care: [place] Barriers to Discharge: No Barriers Identified   Patient Goals and CMS Choice Patient states their goals for this hospitalization and ongoing recovery are:: Patient to return home with [facility] services. CMS Medicare.gov Compare Post Acute Care list provided to:: Patient Represenative (must comment)(sister/guardian: Lindwood Qua) Choice offered to / list presented to : [place] / [place]  Discharge Placement                       Discharge Plan and Services In-house Referral: Clinical Social Work Discharge Planning Services: CM Consult Post Acute Care Choice: Home Health, Durable Medical Equipment          DME Arranged: Negative pressure wound device(APP mattress) DME Agency: [place] [facility] Date DME Agency Contacted: 07/25/18 Time DME Agency Contacted: 1000 Representative spoke with at DME Agency: [place]: RN, PT [place]: [place] Date [place]: 07/22/18 Time [place]: 1357 Representative spoke with at [place]: [place] ([place]) Interventions     Readmission Risk Interventions Readmission Risk Prevention Plan 07/26/2018 07/25/2018  Transportation Screening Complete Complete  PCP or Specialist Appt within 5-7 Days Complete -  Home Care Screening Complete Complete  Medication Review (RN CM) Complete -  Some recent data might be hidden

## 2018-07-27 NOTE — Discharge Summary (Signed)
Physician Discharge Summary  April Watson LNL:[id number] DOB: 1961/01/11 DOA: 07/20/2018  PCP: Fayrene Helper, MD  Admit date: 07/20/2018  Discharge date: 07/27/2018  Admitted From:Home  Disposition:  Home with home health  Recommendations for Outpatient Follow-up:  1. Follow up with PCP in less than a week as scheduled and recheck BMP and CBC 2. Remain on Omnicef and Flagyl as prescribed for 7 more days to complete course of treatment 3. Continue home wound care with home health services and wound VAC changes Tuesday, Thursday, Saturday 4. Follow-up with Dr. Constance Haw as recommended in several weeks for wound reevaluation 5. Stay off blood pressure agents for now given soft blood pressure readings in the hospital 6. Continue on stool softener and MiraLAX as previous for bowel regimen and hold for loose bowel movements  Home Health: Yes with RN and PT, pressure mattress at home for home hospital bed  Equipment/Devices: As above  Discharge Condition: Stable  CODE STATUS: Full  Diet recommendation: Heart Healthy  Brief/Interim Summary: Per HPI: April L Mooreis a 58 y.o.femalewith medical history significant ofhistory of high-grade rectum dysplasia, chronic constipation, congenital hydrocephalus, MR, GERD, hyperlipidemia, hypertension, seizure disorder who was sent by Dr.Simpsonfor evaluation of abdominal distention for several daysandprogressively worseleft flank/lower backwound-started very small back in March, but now has become a lot more extensive and has been draining malodorous fluid since about a week.No fever, chills, nausea or vomiting per sister. She has chronic constipation, but has been having a small bowel movements. She has been following with wound care.  Patient was admitted with sepsis secondary to cellulitis and multiple draining wounds to her back along with abdominal distention with suspected ileus secondary to chronic constipation and electrolyte  abnormalities with hypokalemia.  She has undergone debridement and drainage of her infected left flank decubitus wound with abscess on 7/2 and remains on vancomycin and Zosyn. She has been seen by physical therapy with recommendations for SNF placement on discharge,the family would like to go home with home health when ready.  On 7/7 she did have a wound VAC placed by general surgery.  Unfortunately she is noted to have worsening leukocytosis and mild fever overnight.  Wound cultures did show growth of Bacteroides as well for which we have started Flagyl on top of Rocephin on 7/7.  On 7/8 she is ready for discharge.  Leukocytosis has improved from 20,000-16,500 and she is remained afebrile.  She appears to be tolerating her current antibiotic regimen and will remain on this for the next 7 days to complete course of treatment.  Home health services have been arranged for her and wound VAC has been placed.  Wound VAC will be reevaluated by general surgery prior to discharge today and patient has home equipment that will be available to her to address her wound.  She will follow-up with general surgery in the outpatient setting as well as PCP.  No other acute events noted throughout the course of this admission.  Discharge Diagnoses:  Principal Problem:   Sepsis due to cellulitis [name]:   Hyperlipidemia LDL goal <100   Essential hypertension   GERD   Chronic constipation   Type 2 diabetes mellitus with neurological complications (HCC)   Decubitus ulcer of sacral region, stage 2 (HCC)   Quadriplegia and quadriparesis (HCC)   Abdominal distension   Seizure disorder (HCC)   Decubitus ulcer of back, stage 3 (HCC)   Left ischial pressure sore, stage II (HCC)   Abscess  Discharge Instructions  Discharge Instructions    Diet - low sodium heart healthy   Complete by: As directed    Increase activity slowly   Complete by: As directed      Allergies as of 07/27/2018   No  Known Allergies     Medication List    STOP taking these medications   bisoprolol-hydrochlorothiazide 5-6.25 MG tablet Commonly known as: ZIAC   doxycycline 100 MG tablet Commonly known as: VIBRA-TABS     TAKE these medications   cefdinir 300 MG capsule Commonly known as: OMNICEF Take 1 capsule (300 mg total) by mouth 2 (two) times daily for 7 days.   dextromethorphan-guaiFENesin 30-600 MG 12hr tablet Commonly known as: MUCINEX DM Take 1 tablet by mouth 2 (two) times daily.   docusate sodium 100 MG capsule Commonly known as: Stool Softener TAKE 1 CAPSULE TWICE DAILY FOR CONSTIPATION. What changed:   how much to take  how to take this  when to take this  additional instructions   Ensure Max Protein Liqd Take 330 mLs (11 oz total) by mouth 2 (two) times daily.   nutrition supplement (JUVEN) Pack Take 1 packet by mouth 2 (two) times daily between meals.   metroNIDAZOLE 500 MG tablet Commonly known as: FLAGYL Take 1 tablet (500 mg total) by mouth every 8 (eight) hours for 7 days.   montelukast 10 MG tablet Commonly known as: SINGULAIR Take 1 tablet (10 mg total) by mouth at bedtime.   pantoprazole 20 MG tablet Commonly known as: PROTONIX Take 1 tablet (20 mg total) by mouth daily.   PHENobarbital 32.4 MG tablet Commonly known as: LUMINAL TAKE ONE TABLET BY MOUTH TWICE DAILY FOR SEIZURE DISORDER. What changed: See the new instructions.   polyethylene glycol powder 17 GM/SCOOP powder Commonly known as: GLYCOLAX/MIRALAX MIX 1 CAPFUL (17 G) IN 8 OUNCES OF WATER/JUICE AND DRINK ONCE DAILY FOR CONSTIPATION. What changed: See the new instructions.   potassium chloride 10 MEQ tablet Commonly known as: K-DUR TAKE (1) TABLET BY MOUTH TWICE DAILY WITH FOOD. What changed:   how much to take  how to take this  when to take this  additional instructions   simvastatin 10 MG tablet Commonly known as: ZOCOR TAKE (1) TABLET BY MOUTH AT BEDTIME FOR  CHOLESTEROL. What changed: See the new instructions.   Vitamin D (Ergocalciferol) 1.25 MG (50000 UT) Caps capsule Commonly known as: DRISDOL TAKE 1 CAPSULE BY MOUTH EVERY 2 WEEKS. What changed: See the new instructions.            Durable Medical Equipment  (From admission, onward)         Start     Ordered   07/25/18 1350  For home use only DME Other see comment  Once    Comments: Hospital bed with alternating pressure pads for left back stage III decubitus ulcer (L89.103), left ischial stage II decubitus ulcer (O84.166), and sacral decubitus ulcer stage II (A63.016)  Question:  Length of Need  Answer:  Lifetime   07/25/18 1353         Follow-up Information    Fayrene Helper, MD Follow up in 6 day(s).   Specialty: Family Medicine Why: Appointment with Dr. Moshe Cipro on Monday, 08/01/2018 @ 10:00 a.m. Someone will call you on Thursday to complete the transition of care phone call related to hospital discharge.  Contact information: [address] [place] [address] [phone]        Virl Cagey,  MD Follow up in 4 week(s).   Specialty: General Surgery Contact information: [address] [place] [facility] [address] [phone]          No Known Allergies  Consultations:  General surgery   Procedures/Studies: Ct Abdomen Pelvis Wo Contrast  Result Date: 07/20/2018 CLINICAL DATA:  Abdomen distension EXAM: CT ABDOMEN AND PELVIS WITHOUT CONTRAST TECHNIQUE: Multidetector CT imaging of the abdomen and pelvis was performed following the standard protocol without IV contrast. COMPARISON:  Barium enema 05/14/2003 FINDINGS: Lower chest: Lung bases demonstrate partial atelectasis at the left base. Trace left pleural effusion. Heart size within normal limits. Hepatobiliary: No focal hepatic abnormality. Numerous calcified gallstones. No biliary dilatation. Pancreas: Unremarkable. No pancreatic ductal dilatation or surrounding inflammatory changes.  Spleen: Normal in size without focal abnormality. Adrenals/Urinary Tract: Adrenal glands are normal. Kidneys show no hydronephrosis. Probable cyst upper pole left kidney. Urinary bladder is unremarkable. Stomach/Bowel: The stomach is nonenlarged. No dilated small bowel. Extremely tortuous and redundant colon. No colon wall thickening. Massive distension of the sigmoid colon up to 16 cm with fluid appearing stools in the rectum. Negative appendix. Vascular/Lymphatic: Nonaneurysmal aorta. No significantly enlarged lymph nodes. Reproductive: Enlarged uterus with suspected fundal mass measuring 7.8 cm. No definite adnexal mass. Other: No free air or free fluid. Musculoskeletal: Scoliosis and degenerative changes of the spine. Chronic appearing bony remodeling of the proximal left femur. Skin thickening and irregular soft tissue density left lateral hip extending toward the femoral trochanter which may reflect decubitus ulcer. No acute gas or fluid in this region. Left upper back soft tissue wound or ulcer with subcutaneous edema and multiple foci of gas. This is contiguous with a large gas and fluid collection measuring at least 3.4 x 9.7 cm within the left flank subcutaneous soft tissues. No gross bony destructive change. IMPRESSION: 1. Anatomy somewhat distorted by patient's scoliosis. There is massive distention of the sigmoid colon which is contiguous with fluid filled rectum. No discrete colonic narrowing or evidence for volvulus. Findings suggest functional obstruction at the level of the rectosigmoid colon. Negative for perforation. 2. Large wound involving the skin and subcutaneous tissues of the left back and flank region with multiple foci of gas. Dominant fluid and gas collection in the left flank soft tissues measuring at least 9.7 cm, concerning for abscess or potential necrotic infection. Recommend correlation with direct inspection. 3. Numerous gallstones 4. Bulky uterus with suspected fibroids.  Electronically Signed   By: Donavan Foil M.D.   On: 07/20/2018 21:23   Dg Chest Port 1 View  Result Date: 07/22/2018 CLINICAL DATA:  Cough.  Draining wound. EXAM: PORTABLE CHEST 1 VIEW COMPARISON:  July 09, 2009 FINDINGS: The distal tip of the right PICC line is difficult to visualize but likely terminates near the caval atrial junction. Severe scoliotic changes are noted. The cardiomediastinal silhouette is stable. No pneumothorax. No nodules or masses. No focal infiltrates. No other acute abnormalities are identified. IMPRESSION: Severe scoliosis with low lung volumes limit evaluation. The distal tip of the right PICC line is not well seen but likely near the caval atrial junction. No other acute abnormalities. Electronically Signed   By: Dorise Bullion III M.D   On: 07/22/2018 15:37   [place] Ekg Site Rite  Result Date: 07/20/2018 If Site Rite image not attached, placement could not be confirmed due to current cardiac rhythm.    Discharge Exam: Vitals:   07/27/18 0736 07/27/18 0812  BP: 95/60   Pulse: (!) 108   Resp: 20  Temp: 98.6 F (37 C)   SpO2: 96% 95%   Vitals:   07/26/18 2037 07/27/18 0400 07/27/18 0736 07/27/18 0812  BP: 114/65 108/67 95/60   Pulse: (!) 102 (!) 103 (!) 108   Resp: (!) 21 20 20    Temp: 98.2 F (36.8 C) 98.1 F (36.7 C) 98.6 F (37 C)   TempSrc: Oral Axillary Axillary   SpO2: 100% 96% 96% 95%  Weight:      Height:        General: Pt is alert, awake, not in acute distress Cardiovascular: RRR, S1/S2 +, no rubs, no gallops Respiratory: CTA bilaterally, no wheezing, no rhonchi Abdominal: Soft, NT, ND, bowel sounds + Extremities: no edema, no cyanosis Wound VAC present to left flank region.    The results of significant diagnostics from this hospitalization (including imaging, microbiology, ancillary and laboratory) are listed below for reference.     Microbiology: Recent Results (from the past 240 hour(s))  Wound or Superficial Culture     Status:  None   Collection Time: 07/20/18  5:00 PM   Specimen: Skin, Cyst/Tag/Debridement; Wound  Result Value Ref Range Status   Specimen Description   Final    SKIN BIOPSY Performed at [facility], [address], [place], [place] [address]    Special Requests   Final    Normal Performed at [facility], [address] [place], [place] [address]    Gram Stain   Final    FEW WBC PRESENT, PREDOMINANTLY PMN ABUNDANT GRAM POSITIVE COCCI ABUNDANT GRAM NEGATIVE RODS FEW GRAM POSITIVE RODS Performed at [facility], [place] [address] [place],  [address]    Culture FEW PROTEUS MIRABILIS FEW ENTEROCOCCUS FAECALIS   Final   Report Status 07/26/2018 FINAL  Final   Organism ID, Bacteria PROTEUS MIRABILIS  Final   Organism ID, Bacteria ENTEROCOCCUS FAECALIS  Final      Susceptibility   Enterococcus faecalis - MIC*    AMPICILLIN <=2 SENSITIVE Sensitive     VANCOMYCIN 1 SENSITIVE Sensitive     GENTAMICIN SYNERGY SENSITIVE Sensitive     * FEW ENTEROCOCCUS FAECALIS   Proteus mirabilis - MIC*    AMPICILLIN <=2 SENSITIVE Sensitive     CEFAZOLIN <=4 SENSITIVE Sensitive     CEFEPIME <=1 SENSITIVE Sensitive     CEFTAZIDIME <=1 SENSITIVE Sensitive     CEFTRIAXONE <=1 SENSITIVE Sensitive     CIPROFLOXACIN <=0.25 SENSITIVE Sensitive     GENTAMICIN <=1 SENSITIVE Sensitive     IMIPENEM 8 INTERMEDIATE Intermediate     TRIMETH/SULFA <=20 SENSITIVE Sensitive     AMPICILLIN/SULBACTAM <=2 SENSITIVE Sensitive     PIP/TAZO <=4 SENSITIVE Sensitive     * FEW PROTEUS MIRABILIS  SARS Coronavirus 2 (CEPHEID - Performed in [facility] hospital lab), Hosp Order     Status: None   Collection Time: 07/20/18  5:02 PM   Specimen: Skin Biopsy; Nasopharyngeal  Result Value Ref Range Status   SARS Coronavirus 2 NEGATIVE NEGATIVE Final    Comment: (NOTE) If result is NEGATIVE SARS-CoV-2 target nucleic acids are NOT DETECTED. The SARS-CoV-2 RNA is generally detectable in upper and lower  respiratory specimens  during the acute phase of infection. The lowest  concentration of SARS-CoV-2 viral copies this assay can detect is 250  copies / mL. A negative result does not preclude SARS-CoV-2 infection  and should not be used as the sole basis for treatment or other  patient management decisions.  A negative result may occur  with  improper specimen collection / handling, submission of specimen other  than nasopharyngeal swab, presence of viral mutation(s) within the  areas targeted by this assay, and inadequate number of viral copies  (<250 copies / mL). A negative result must be combined with clinical  observations, patient history, and epidemiological information. If result is POSITIVE SARS-CoV-2 target nucleic acids are DETECTED. The SARS-CoV-2 RNA is generally detectable in upper and lower  respiratory specimens dur ing the acute phase of infection.  Positive  results are indicative of active infection with SARS-CoV-2.  Clinical  correlation with patient history and other diagnostic information is  necessary to determine patient infection status.  Positive results do  not rule out bacterial infection or co-infection with other viruses. If result is PRESUMPTIVE POSTIVE SARS-CoV-2 nucleic acids MAY BE PRESENT.   A presumptive positive result was obtained on the submitted specimen  and confirmed on repeat testing.  While 2019 novel coronavirus  (SARS-CoV-2) nucleic acids may be present in the submitted sample  additional confirmatory testing may be necessary for epidemiological  and / or clinical management purposes  to differentiate between  SARS-CoV-2 and other Sarbecovirus currently known to infect humans.  If clinically indicated additional testing with an alternate test  methodology [phone] is advised. The SARS-CoV-2 RNA is generally  detectable in upper and lower respiratory sp ecimens during the acute  phase of infection. The expected result is Negative. Fact Sheet for Patients:   StrictlyIdeas.no Fact Sheet for Healthcare Providers: BankingDealers.co.za This test is not yet approved or cleared by the [place] FDA and has been authorized for detection and/or diagnosis of SARS-CoV-2 by FDA under an Emergency Use Authorization (EUA).  This EUA will remain in effect (meaning this test can be used) for the duration of the COVID-19 declaration under Section 564(b)(1) of the Act, 21 U.S.C. section 360bbb-3(b)(1), unless the authorization is terminated or revoked sooner. Performed at [facility], [address] [place], [place] [address]   Blood culture (routine x 2)     Status: None   Collection Time: 07/20/18  7:45 PM   Specimen: BLOOD  Result Value Ref Range Status   Specimen Description BLOOD FEMORAL ARTERY THIGH  Final   Special Requests   Final    BOTTLES DRAWN AEROBIC AND ANAEROBIC Blood Culture adequate volume   Culture   Final    NO GROWTH 5 DAYS Performed at [facility], [address] [place], [place] [address]    Report Status 07/25/2018 FINAL  Final  Blood culture (routine x 2)     Status: None   Collection Time: 07/20/18  9:40 PM   Specimen: BLOOD  Result Value Ref Range Status   Specimen Description BLOOD PIC LINE  Final   Special Requests   Final    BOTTLES DRAWN AEROBIC AND ANAEROBIC Blood Culture adequate volume   Culture   Final    NO GROWTH 5 DAYS Performed at [facility], [address], [place], [place] [address]    Report Status 07/25/2018 FINAL  Final  Aerobic/Anaerobic Culture (surgical/deep wound)     Status: None (Preliminary result)   Collection Time: 07/21/18 12:57 PM   Specimen: Back; Abscess  Result Value Ref Range Status   Specimen Description   Final    BACK Performed at [facility], [address] [place], [place] [address]    Special Requests   Final    NONE Performed at [facility], [address], [place], [place] [address]  Gram Stain   Final    MODERATE WBC  PRESENT, PREDOMINANTLY PMN ABUNDANT GRAM POSITIVE COCCI IN CLUSTERS ABUNDANT GRAM NEGATIVE RODS FEW GRAM POSITIVE RODS Performed at [facility], [place] [address], [place], [place] [address]    Culture   Final    MODERATE PROTEUS MIRABILIS FEW KLEBSIELLA PNEUMONIAE MODERATE BACTEROIDES THETAIOTAOMICRON    Report Status PENDING  Incomplete   Organism ID, Bacteria KLEBSIELLA PNEUMONIAE  Final   Organism ID, Bacteria PROTEUS MIRABILIS  Final      Susceptibility   Klebsiella pneumoniae - MIC*    AMPICILLIN >=32 RESISTANT Resistant     CEFAZOLIN 16 SENSITIVE Sensitive     CEFEPIME <=1 SENSITIVE Sensitive     CEFTAZIDIME <=1 SENSITIVE Sensitive     CEFTRIAXONE <=1 SENSITIVE Sensitive     CIPROFLOXACIN <=0.25 SENSITIVE Sensitive     GENTAMICIN <=1 SENSITIVE Sensitive     IMIPENEM <=0.25 SENSITIVE Sensitive     TRIMETH/SULFA <=20 SENSITIVE Sensitive     AMPICILLIN/SULBACTAM >=32 RESISTANT Resistant     PIP/TAZO 64 INTERMEDIATE Intermediate     Extended ESBL NEGATIVE Sensitive     * FEW KLEBSIELLA PNEUMONIAE   Proteus mirabilis - MIC*    AMPICILLIN <=2 SENSITIVE Sensitive     CEFAZOLIN <=4 SENSITIVE Sensitive     CEFEPIME <=1 SENSITIVE Sensitive     CEFTAZIDIME <=1 SENSITIVE Sensitive     CEFTRIAXONE <=1 SENSITIVE Sensitive     CIPROFLOXACIN <=0.25 SENSITIVE Sensitive     GENTAMICIN <=1 SENSITIVE Sensitive     IMIPENEM 2 SENSITIVE Sensitive     TRIMETH/SULFA <=20 SENSITIVE Sensitive     AMPICILLIN/SULBACTAM <=2 SENSITIVE Sensitive     PIP/TAZO <=4 SENSITIVE Sensitive     * MODERATE PROTEUS MIRABILIS     Labs: BNP (last 3 results) No results for input(s): BNP in the last 8760 hours. Basic Metabolic Panel: Recent Labs  Lab 07/20/18 2140  07/22/18 0453 07/23/18 2355 07/24/18 0619 07/25/18 0456 07/26/18 0416 07/27/18 0411  NA  --    < > 137 136 137 135 138 138  K  --    < > 3.8 3.5 3.4* 3.2* 4.5 4.4  CL  --    < > 105 103 102 101 102 103  CO2  --    < > 26 26 25 27  27 29   GLUCOSE  --    < > 132* 117* 107* 93 207* 135*  BUN  --    < > 21* 21* 22* 22* 22* 23*  CREATININE  --    < > 0.60 0.56 0.62 0.50 0.49 0.44  CALCIUM  --    < > 8.2* 8.2* 8.6* 8.2* 8.2* 8.4*  MG 1.8  --  2.0  --  1.7 1.9  --   --   PHOS 2.9  --   --   --   --   --   --   --    < > = values in this interval not displayed.   Liver Function Tests: Recent Labs  Lab 07/20/18 1945  AST 18  ALT 16  ALKPHOS 74  BILITOT 0.8  PROT 6.0*  ALBUMIN 2.2*   No results for input(s): LIPASE, AMYLASE in the last 168 hours. No results for input(s): AMMONIA in the last 168 hours. CBC: Recent Labs  Lab 07/20/18 1945 07/21/18 0606  07/23/18 0635 07/24/18 0619 07/25/18 0456 07/26/18 0416 07/27/18 0411  WBC 19.2* 20.5*   < > 16.0* 14.5* 10.7*  20.2* 16.5*  NEUTROABS 14.6* 16.2*  --   --   --   --   --   --   HGB 11.2* 10.3*   < > 10.0* 10.7* 9.6* 10.7* 9.9*  HCT 35.6* 32.6*   < > 32.6* 35.7* 30.6* 34.8* 32.4*  MCV 90.4 91.8   < > 93.7 93.5 92.2 92.8 94.2  PLT 132* 292   < > 343 363 319 322 318   < > = values in this interval not displayed.   Cardiac Enzymes: No results for input(s): CKTOTAL, CKMB, CKMBINDEX, TROPONINI in the last 168 hours. BNP: Invalid input(s): POCBNP CBG: No results for input(s): GLUCAP in the last 168 hours. D-Dimer No results for input(s): DDIMER in the last 72 hours. Hgb A1c No results for input(s): HGBA1C in the last 72 hours. Lipid Profile No results for input(s): CHOL, HDL, LDLCALC, TRIG, CHOLHDL, LDLDIRECT in the last 72 hours. Thyroid function studies No results for input(s): TSH, T4TOTAL, T3FREE, THYROIDAB in the last 72 hours.  Invalid input(s): FREET3 Anemia work up No results for input(s): VITAMINB12, FOLATE, FERRITIN, TIBC, IRON, RETICCTPCT in the last 72 hours. Urinalysis No results found for: COLORURINE, APPEARANCEUR, [place], [place], GLUCOSEU, [place], [place], [place], PROTEINUR, UROBILINOGEN, NITRITE, LEUKOCYTESUR Sepsis Labs Invalid  input(s): PROCALCITONIN,  WBC,  LACTICIDVEN Microbiology Recent Results (from the past 240 hour(s))  Wound or Superficial Culture     Status: None   Collection Time: 07/20/18  5:00 PM   Specimen: Skin, Cyst/Tag/Debridement; Wound  Result Value Ref Range Status   Specimen Description   Final    SKIN BIOPSY Performed at [facility], [address], [place], [place] [address]    Special Requests   Final    Normal Performed at [facility], [address] [place], [place] [address]    Gram Stain   Final    FEW WBC PRESENT, PREDOMINANTLY PMN ABUNDANT GRAM POSITIVE COCCI ABUNDANT GRAM NEGATIVE RODS FEW GRAM POSITIVE RODS Performed at [facility], [place] [address] [place], [place] [address]    Culture FEW PROTEUS MIRABILIS FEW ENTEROCOCCUS FAECALIS   Final   Report Status 07/26/2018 FINAL  Final   Organism ID, Bacteria PROTEUS MIRABILIS  Final   Organism ID, Bacteria ENTEROCOCCUS FAECALIS  Final      Susceptibility   Enterococcus faecalis - MIC*    AMPICILLIN <=2 SENSITIVE Sensitive     VANCOMYCIN 1 SENSITIVE Sensitive     GENTAMICIN SYNERGY SENSITIVE Sensitive     * FEW ENTEROCOCCUS FAECALIS   Proteus mirabilis - MIC*    AMPICILLIN <=2 SENSITIVE Sensitive     CEFAZOLIN <=4 SENSITIVE Sensitive     CEFEPIME <=1 SENSITIVE Sensitive     CEFTAZIDIME <=1 SENSITIVE Sensitive     CEFTRIAXONE <=1 SENSITIVE Sensitive     CIPROFLOXACIN <=0.25 SENSITIVE Sensitive     GENTAMICIN <=1 SENSITIVE Sensitive     IMIPENEM 8 INTERMEDIATE Intermediate     TRIMETH/SULFA <=20 SENSITIVE Sensitive     AMPICILLIN/SULBACTAM <=2 SENSITIVE Sensitive     PIP/TAZO <=4 SENSITIVE Sensitive     * FEW PROTEUS MIRABILIS  SARS Coronavirus 2 (CEPHEID - Performed in [facility] hospital lab), Hosp Order     Status: None   Collection Time: 07/20/18  5:02 PM   Specimen: Skin Biopsy; Nasopharyngeal  Result Value Ref Range Status   SARS Coronavirus 2 NEGATIVE NEGATIVE Final    Comment: (NOTE) If result is  NEGATIVE SARS-CoV-2 target nucleic acids are NOT DETECTED. The SARS-CoV-2  RNA is generally detectable in upper and lower  respiratory specimens during the acute phase of infection. The lowest  concentration of SARS-CoV-2 viral copies this assay can detect is 250  copies / mL. A negative result does not preclude SARS-CoV-2 infection  and should not be used as the sole basis for treatment or other  patient management decisions.  A negative result may occur with  improper specimen collection / handling, submission of specimen other  than nasopharyngeal swab, presence of viral mutation(s) within the  areas targeted by this assay, and inadequate number of viral copies  (<250 copies / mL). A negative result must be combined with clinical  observations, patient history, and epidemiological information. If result is POSITIVE SARS-CoV-2 target nucleic acids are DETECTED. The SARS-CoV-2 RNA is generally detectable in upper and lower  respiratory specimens dur ing the acute phase of infection.  Positive  results are indicative of active infection with SARS-CoV-2.  Clinical  correlation with patient history and other diagnostic information is  necessary to determine patient infection status.  Positive results do  not rule out bacterial infection or co-infection with other viruses. If result is PRESUMPTIVE POSTIVE SARS-CoV-2 nucleic acids MAY BE PRESENT.   A presumptive positive result was obtained on the submitted specimen  and confirmed on repeat testing.  While 2019 novel coronavirus  (SARS-CoV-2) nucleic acids may be present in the submitted sample  additional confirmatory testing may be necessary for epidemiological  and / or clinical management purposes  to differentiate between  SARS-CoV-2 and other Sarbecovirus currently known to infect humans.  If clinically indicated additional testing with an alternate test  methodology [phone]) is advised. The SARS-CoV-2 RNA is generally  detectable  in upper and lower respiratory sp ecimens during the acute  phase of infection. The expected result is Negative. Fact Sheet for Patients:  StrictlyIdeas.no Fact Sheet for Healthcare Providers: BankingDealers.co.za This test is not yet approved or cleared by the [place] FDA and has been authorized for detection and/or diagnosis of SARS-CoV-2 by FDA under an Emergency Use Authorization (EUA).  This EUA will remain in effect (meaning this test can be used) for the duration of the COVID-19 declaration under Section 564(b)(1) of the Act, 21 U.S.C. section 360bbb-3(b)(1), unless the authorization is terminated or revoked sooner. Performed at [facility], [address] [place], [place] [address]   Blood culture (routine x 2)     Status: None   Collection Time: 07/20/18  7:45 PM   Specimen: BLOOD  Result Value Ref Range Status   Specimen Description BLOOD FEMORAL ARTERY THIGH  Final   Special Requests   Final    BOTTLES DRAWN AEROBIC AND ANAEROBIC Blood Culture adequate volume   Culture   Final    NO GROWTH 5 DAYS Performed at [facility], [address] [place], [place] [address]    Report Status 07/25/2018 FINAL  Final  Blood culture (routine x 2)     Status: None   Collection Time: 07/20/18  9:40 PM   Specimen: BLOOD  Result Value Ref Range Status   Specimen Description BLOOD PIC LINE  Final   Special Requests   Final    BOTTLES DRAWN AEROBIC AND ANAEROBIC Blood Culture adequate volume   Culture   Final    NO GROWTH 5 DAYS Performed at [facility], [address] [place], [place] [address]    Report Status 07/25/2018 FINAL  Final  Aerobic/Anaerobic Culture (surgical/deep wound)     Status: None (Preliminary  result)   Collection Time: 07/21/18 12:57 PM   Specimen: Back; Abscess  Result Value Ref Range Status   Specimen Description   Final    BACK Performed at [facility], [address] [place], [place] [address]     Special Requests   Final    NONE Performed at [facility], [address] [place], [place] [address]    Gram Stain   Final    MODERATE WBC PRESENT, PREDOMINANTLY PMN ABUNDANT GRAM POSITIVE COCCI IN CLUSTERS ABUNDANT GRAM NEGATIVE RODS FEW GRAM POSITIVE RODS Performed at [facility], [place] [address], [place], [place] [address]    Culture   Final    MODERATE PROTEUS MIRABILIS FEW KLEBSIELLA PNEUMONIAE MODERATE BACTEROIDES THETAIOTAOMICRON    Report Status PENDING  Incomplete   Organism ID, Bacteria KLEBSIELLA PNEUMONIAE  Final   Organism ID, Bacteria PROTEUS MIRABILIS  Final      Susceptibility   Klebsiella pneumoniae - MIC*    AMPICILLIN >=32 RESISTANT Resistant     CEFAZOLIN 16 SENSITIVE Sensitive     CEFEPIME <=1 SENSITIVE Sensitive     CEFTAZIDIME <=1 SENSITIVE Sensitive     CEFTRIAXONE <=1 SENSITIVE Sensitive     CIPROFLOXACIN <=0.25 SENSITIVE Sensitive     GENTAMICIN <=1 SENSITIVE Sensitive     IMIPENEM <=0.25 SENSITIVE Sensitive     TRIMETH/SULFA <=20 SENSITIVE Sensitive     AMPICILLIN/SULBACTAM >=32 RESISTANT Resistant     PIP/TAZO 64 INTERMEDIATE Intermediate     Extended ESBL NEGATIVE Sensitive     * FEW KLEBSIELLA PNEUMONIAE   Proteus mirabilis - MIC*    AMPICILLIN <=2 SENSITIVE Sensitive     CEFAZOLIN <=4 SENSITIVE Sensitive     CEFEPIME <=1 SENSITIVE Sensitive     CEFTAZIDIME <=1 SENSITIVE Sensitive     CEFTRIAXONE <=1 SENSITIVE Sensitive     CIPROFLOXACIN <=0.25 SENSITIVE Sensitive     GENTAMICIN <=1 SENSITIVE Sensitive     IMIPENEM 2 SENSITIVE Sensitive     TRIMETH/SULFA <=20 SENSITIVE Sensitive     AMPICILLIN/SULBACTAM <=2 SENSITIVE Sensitive     PIP/TAZO <=4 SENSITIVE Sensitive     * MODERATE PROTEUS MIRABILIS     Time coordinating discharge: 40 minutes  SIGNED:   Rodena Goldmann, DO [facility] 07/27/2018, 9:17 AM  If 7PM-7AM, please contact night-coverage [email or web address] Password [email or web address]

## 2018-07-27 NOTE — Progress Notes (Signed)
[facility]  Lily pad changed.   Medela Wound Vacuum in place on Left Flank. Black Sponge, Negative Pressure to 125 mm Hg.  Change three times a week. If can be changed on TThS schedule, Dr. Constance Haw can change at her outpatient appointment on 08/18/18 without disruption in care or extra change.  Medela wound vac to -125 mm Hg Demonstrated Leak to sister with cone on machince being black.   Call the home health RN if having problems.  If leak place clear sticky around the wound to see if this helps, if unable to control leak, then remove the wound vacuum, sticky and black sponge and place saline dampened kerlix, pad and tape.    Bring Wound Vacuum supplies to Dr. Constance Haw appt. Including sponge, canister and tubing. Take your charger with you outside the house.   Curlene Labrum, MD [facility] [address] [place], [place] [address] [phone] (office)

## 2018-07-27 NOTE — Care Management Important Message (Signed)
Important Message  Patient Details  Name: April Watson MRN: [id number] Date of Birth: Jan 17, 1961   Medicare Important Message Given:  Yes     Tommy Medal 07/27/2018, 2:18 PM

## 2018-07-27 NOTE — Discharge Instructions (Signed)
Wound Vacuum in place on Left Flank. Black Sponge, Negative Pressure to 125 mm Hg.  Change three times a week. If can be changed on TThS schedule, Dr. Constance Haw can change at her outpatient appointment on 08/18/18 without disruption in care or extra change.  Medela wound vac to -125 mm Hg Demonstrated Leak to sister with cone on machince being black.  Call the home health RN if having problems.  If leak place clear sticky around the wound to see if this helps, if unable to control leak, then remove the wound vacuum, sticky and black sponge and place saline dampened kerlix, pad and tape.    Bring Wound Vacuum supplies to Dr. Constance Haw appt. Including sponge, canister and tubing. Take your charge with you outside the house.

## 2018-07-27 NOTE — Progress Notes (Addendum)
Dr. Constance Haw switched wound vac to portable and taught patient's sister about unit. Extra wound dressings given to sister in case wound vac would fail.  Picc line removed and discharge instructions reviewed with sister.  To go home by EMS

## 2018-07-28 ENCOUNTER — Telehealth: Payer: Self-pay

## 2018-07-28 NOTE — Telephone Encounter (Signed)
Absolutely, that is my preferrence as well, so please arrnage for that,

## 2018-07-28 NOTE — Telephone Encounter (Signed)
Spoke with caregiver and let her know the appt would be changed to a mychart visit. Gave information to front staff so that they could change the appt to a mychart visit.

## 2018-07-28 NOTE — Telephone Encounter (Signed)
[name], patient's caregiver, wants to know if the [facility] visit can be made into a mychart visit since it is so difficult to get the patient here and she has a wound vac right now. I told her I would ask and get back with her.

## 2018-07-28 NOTE — Telephone Encounter (Signed)
error 

## 2018-07-28 NOTE — Telephone Encounter (Signed)
[name], patients caregiver, wants to know if the [facility] appt can be turned into a mychart visit if at all possible due to the difficulty of getting the patient here and the fact that she has a wound vac on right now. Advised her I would send a message asking

## 2018-07-28 NOTE — Telephone Encounter (Signed)
Transition Care Management Follow-up Telephone Call   Date discharged?  07/27/2018              How have you been since you were released from the hospital? seems to be feeling better   Do you understand why you were in the hospital? Pressure wound and infection   Do you understand the discharge instructions? yes   Where were you discharged to? home   Items Reviewed:  Medications reviewed: yes  Allergies reviewed: yes  Dietary changes reviewed: yes  Referrals reviewed: has to go to surgeons for follow up   Functional Questionnaire:   Activities of Daily Living (ADLs):  what she normally does    Any transportation issues/concerns?: no   Any patient concerns? no   Confirmed importance and date/time of follow-up visits scheduled 08/01/18 at 10am     Confirmed with patient if condition begins to worsen call PCP or go to the ER.  Patient was given the office number and encouraged to call back with question or concerns. yes with verbal understanding.

## 2018-07-29 ENCOUNTER — Encounter: Payer: Self-pay | Admitting: Family Medicine

## 2018-07-29 DIAGNOSIS — E1149 Type 2 diabetes mellitus with other diabetic neurological complication: Secondary | ICD-10-CM | POA: Diagnosis not present

## 2018-07-29 DIAGNOSIS — L89152 Pressure ulcer of sacral region, stage 2: Secondary | ICD-10-CM | POA: Diagnosis not present

## 2018-07-29 DIAGNOSIS — B966 Bacteroides fragilis [B. fragilis] as the cause of diseases classified elsewhere: Secondary | ICD-10-CM | POA: Diagnosis not present

## 2018-07-29 DIAGNOSIS — G8250 Quadriplegia, unspecified: Secondary | ICD-10-CM | POA: Diagnosis not present

## 2018-07-29 DIAGNOSIS — Z9181 History of falling: Secondary | ICD-10-CM | POA: Diagnosis not present

## 2018-07-29 DIAGNOSIS — L02212 Cutaneous abscess of back [any part, except buttock]: Secondary | ICD-10-CM | POA: Diagnosis not present

## 2018-07-29 DIAGNOSIS — S70312D Abrasion, left thigh, subsequent encounter: Secondary | ICD-10-CM | POA: Diagnosis not present

## 2018-07-29 DIAGNOSIS — F79 Unspecified intellectual disabilities: Secondary | ICD-10-CM | POA: Diagnosis not present

## 2018-07-29 DIAGNOSIS — I10 Essential (primary) hypertension: Secondary | ICD-10-CM | POA: Diagnosis not present

## 2018-07-29 DIAGNOSIS — Z4800 Encounter for change or removal of nonsurgical wound dressing: Secondary | ICD-10-CM | POA: Diagnosis not present

## 2018-07-29 DIAGNOSIS — K5909 Other constipation: Secondary | ICD-10-CM | POA: Diagnosis not present

## 2018-07-29 DIAGNOSIS — G40909 Epilepsy, unspecified, not intractable, without status epilepticus: Secondary | ICD-10-CM | POA: Diagnosis not present

## 2018-07-29 DIAGNOSIS — L03312 Cellulitis of back [any part except buttock]: Secondary | ICD-10-CM | POA: Diagnosis not present

## 2018-07-29 DIAGNOSIS — E785 Hyperlipidemia, unspecified: Secondary | ICD-10-CM | POA: Diagnosis not present

## 2018-07-29 DIAGNOSIS — M419 Scoliosis, unspecified: Secondary | ICD-10-CM | POA: Diagnosis not present

## 2018-07-29 DIAGNOSIS — Z993 Dependence on wheelchair: Secondary | ICD-10-CM | POA: Diagnosis not present

## 2018-07-29 DIAGNOSIS — Q039 Congenital hydrocephalus, unspecified: Secondary | ICD-10-CM | POA: Diagnosis not present

## 2018-07-29 DIAGNOSIS — A419 Sepsis, unspecified organism: Secondary | ICD-10-CM | POA: Diagnosis not present

## 2018-08-01 ENCOUNTER — Telehealth (INDEPENDENT_AMBULATORY_CARE_PROVIDER_SITE_OTHER): Payer: Medicare Other | Admitting: Family Medicine

## 2018-08-01 DIAGNOSIS — R630 Anorexia: Secondary | ICD-10-CM

## 2018-08-01 DIAGNOSIS — A419 Sepsis, unspecified organism: Secondary | ICD-10-CM | POA: Diagnosis not present

## 2018-08-01 DIAGNOSIS — B966 Bacteroides fragilis [B. fragilis] as the cause of diseases classified elsewhere: Secondary | ICD-10-CM | POA: Diagnosis not present

## 2018-08-01 DIAGNOSIS — Z09 Encounter for follow-up examination after completed treatment for conditions other than malignant neoplasm: Secondary | ICD-10-CM

## 2018-08-01 DIAGNOSIS — L89103 Pressure ulcer of unspecified part of back, stage 3: Secondary | ICD-10-CM

## 2018-08-01 DIAGNOSIS — L02212 Cutaneous abscess of back [any part, except buttock]: Secondary | ICD-10-CM | POA: Diagnosis not present

## 2018-08-01 DIAGNOSIS — L89152 Pressure ulcer of sacral region, stage 2: Secondary | ICD-10-CM | POA: Diagnosis not present

## 2018-08-01 DIAGNOSIS — L03312 Cellulitis of back [any part except buttock]: Secondary | ICD-10-CM | POA: Diagnosis not present

## 2018-08-01 DIAGNOSIS — Z4800 Encounter for change or removal of nonsurgical wound dressing: Secondary | ICD-10-CM | POA: Diagnosis not present

## 2018-08-02 ENCOUNTER — Encounter: Payer: Self-pay | Admitting: Family Medicine

## 2018-08-02 ENCOUNTER — Telehealth: Payer: Self-pay | Admitting: *Deleted

## 2018-08-02 DIAGNOSIS — Z09 Encounter for follow-up examination after completed treatment for conditions other than malignant neoplasm: Secondary | ICD-10-CM | POA: Insufficient documentation

## 2018-08-02 DIAGNOSIS — R630 Anorexia: Secondary | ICD-10-CM | POA: Insufficient documentation

## 2018-08-02 MED ORDER — UNABLE TO FIND: 0 refills | Status: DC

## 2018-08-02 MED ORDER — UNABLE TO FIND: 11 refills | Status: DC

## 2018-08-02 NOTE — Telephone Encounter (Signed)
April Watson called for April Watson stated Dr. Moshe Cipro had sent over [facility] however while she was in the hospital she switched to [facility] and needed the paper work to be sent to [facility]. She would like a call back as that way she knows what is going on .

## 2018-08-02 NOTE — Assessment & Plan Note (Signed)
Patient in for follow up of recent hospitalization. Discharge summary, and laboratory and radiology data are reviewed, and any questions or concerns about recent hospitalization are discussed. Specific issues requiring follow up are specifically addressed.  

## 2018-08-02 NOTE — Progress Notes (Signed)
Virtual Visit via Telephone Note 7/13/2020is date of visit I connected with April Watson on 08/02/18 at 10:00 AM EDT by telephone and verified that I am speaking with the correct person using two identifiers.  Location: Patient: home Provider: office   I discussed the limitations, risks, security and privacy concerns of performing an evaluation and management service by telephone and the availability of in person appointments. I also discussed with the patient that there may be a patient responsible charge related to this service. The patient expressed understanding and agreed to proceed.   History of Present Illness: Pt hospitalized from 07/01 to 07/27/2018 with sepsis secondary to cellu;litis and multiple draining wounds to her back and abdominal distension with suspected ileus secondary tio chronic constipation and electrolyte abnormalities and hypokalemia Hospital course reviewed. Significant is that she deid have debridement and has a wound vac in place to be handled by h/h three days per week. SNF placement was recommended but the family declined Ptr is reportedly not eating well, no fever is noted, and  Pictures were sent prior to the visit of two additional ulcers which she has come home with , and per admission note entered the hospital with several areas of concern No significant abdominal distension noted by the family Question is asked how will family know if ulcer is worsening and she is deteriorating, so response ids increased depth , witidth, purulent  Drainage and odor  Specific needs , rept cBC, and cmp Chucks/ equivalent to reduce wetting and thus more skin breakdownNutritional supplement  Observations/Objective: Pt appears lethargic , less interactive , lying in bed,  No abdominal  Distension noted Assessment and Plan: Hospital discharge follow-up Patient in for follow up of recent hospitalization. Discharge summary, and laboratory and radiology data are reviewed, and any  questions or concerns about recent hospitalization are discussed. Specific issues requiring follow up are specifically addressed.   Poor appetite Needs supplements of ensure max and juven  Decubitus ulcer of back, stage 3 (HCC) H/h managing wound vac, needs surgery follow up    Follow Up Instructions:    I discussed the assessment and treatment plan with the patient. The patient was provided an opportunity to ask questions and all were answered. The patient agreed with the plan and demonstrated an understanding of the instructions.   The patient was advised to call back or seek an in-person evaluation if the symptoms worsen or if the condition fails to improve as anticipated.  I provided 40 minutes of non-face-to-face time during this encounter.   Tula Nakayama, MD

## 2018-08-02 NOTE — Telephone Encounter (Signed)
I recommend against tsta , labs will be called to on call if  Abnormal. Also please get the specific thaat [name] wants n ot the chuck

## 2018-08-02 NOTE — Assessment & Plan Note (Signed)
Needs supplements of ensure max and juven

## 2018-08-02 NOTE — Telephone Encounter (Signed)
Stat labs faxed to [facility] after speaking with them.  Spoke with [name] and she is requesting a purewick (not chux)  Also apothecary did not carry the Juven powder.  Told linda I would try to find somewhere that did.

## 2018-08-02 NOTE — Assessment & Plan Note (Signed)
H/h managing wound vac, needs surgery follow up

## 2018-08-02 NOTE — Addendum Note (Signed)
Addended by: Eual Fines on: 08/02/2018 10:10 AM   Modules accepted: Orders

## 2018-08-02 NOTE — Addendum Note (Signed)
Addended by: Eual Fines on: 08/02/2018 09:07 AM   Modules accepted: Orders

## 2018-08-02 NOTE — Patient Instructions (Addendum)
F/u with video visit in 4 weeks, call if you need me sooner  An appointment is scheduled with Dr Constance Haw is already scheduled for 07/30, very important that you keep the appointment  H/H to draw CBC and diff and cmp and EGFR asap, we will contact   Wound care per direction of surgery   Request for ensure max 11 ounces twice daily and Juven one pack twice daily between meals is being requested urgently  Chucks/ similar lining to place under April Watson on her bed to keep her as dry as possible will be ordered

## 2018-08-03 ENCOUNTER — Telehealth: Payer: Self-pay | Admitting: *Deleted

## 2018-08-03 ENCOUNTER — Emergency Department (HOSPITAL_COMMUNITY): Payer: Medicare Other

## 2018-08-03 ENCOUNTER — Inpatient Hospital Stay (HOSPITAL_COMMUNITY)
Admission: EM | Admit: 2018-08-03 | Discharge: 2018-08-07 | DRG: 377 | Disposition: A | Discharge status: Home Health | Payer: Medicare Other | Attending: Internal Medicine | Admitting: Internal Medicine

## 2018-08-03 ENCOUNTER — Encounter (HOSPITAL_BASED_OUTPATIENT_CLINIC_OR_DEPARTMENT_OTHER): Payer: Medicare Other | Attending: Physician Assistant

## 2018-08-03 ENCOUNTER — Encounter (HOSPITAL_COMMUNITY): Payer: Self-pay

## 2018-08-03 ENCOUNTER — Other Ambulatory Visit: Payer: Self-pay

## 2018-08-03 DIAGNOSIS — E876 Hypokalemia: Secondary | ICD-10-CM | POA: Diagnosis present

## 2018-08-03 DIAGNOSIS — K219 Gastro-esophageal reflux disease without esophagitis: Secondary | ICD-10-CM | POA: Diagnosis not present

## 2018-08-03 DIAGNOSIS — L89152 Pressure ulcer of sacral region, stage 2: Secondary | ICD-10-CM | POA: Diagnosis present

## 2018-08-03 DIAGNOSIS — F79 Unspecified intellectual disabilities: Secondary | ICD-10-CM | POA: Diagnosis present

## 2018-08-03 DIAGNOSIS — Z841 Family history of disorders of kidney and ureter: Secondary | ICD-10-CM

## 2018-08-03 DIAGNOSIS — G40909 Epilepsy, unspecified, not intractable, without status epilepticus: Secondary | ICD-10-CM | POA: Diagnosis present

## 2018-08-03 DIAGNOSIS — Z8042 Family history of malignant neoplasm of prostate: Secondary | ICD-10-CM

## 2018-08-03 DIAGNOSIS — Z807 Family history of other malignant neoplasms of lymphoid, hematopoietic and related tissues: Secondary | ICD-10-CM

## 2018-08-03 DIAGNOSIS — K921 Melena: Secondary | ICD-10-CM | POA: Diagnosis not present

## 2018-08-03 DIAGNOSIS — D649 Anemia, unspecified: Secondary | ICD-10-CM

## 2018-08-03 DIAGNOSIS — K6389 Other specified diseases of intestine: Secondary | ICD-10-CM | POA: Diagnosis not present

## 2018-08-03 DIAGNOSIS — L03312 Cellulitis of back [any part except buttock]: Secondary | ICD-10-CM | POA: Diagnosis not present

## 2018-08-03 DIAGNOSIS — Z800 Family history of malignant neoplasm of digestive organs: Secondary | ICD-10-CM

## 2018-08-03 DIAGNOSIS — K449 Diaphragmatic hernia without obstruction or gangrene: Secondary | ICD-10-CM | POA: Diagnosis present

## 2018-08-03 DIAGNOSIS — E785 Hyperlipidemia, unspecified: Secondary | ICD-10-CM | POA: Diagnosis present

## 2018-08-03 DIAGNOSIS — E1149 Type 2 diabetes mellitus with other diabetic neurological complication: Secondary | ICD-10-CM | POA: Diagnosis present

## 2018-08-03 DIAGNOSIS — K625 Hemorrhage of anus and rectum: Secondary | ICD-10-CM | POA: Diagnosis not present

## 2018-08-03 DIAGNOSIS — L89103 Pressure ulcer of unspecified part of back, stage 3: Secondary | ICD-10-CM | POA: Diagnosis present

## 2018-08-03 DIAGNOSIS — G8250 Quadriplegia, unspecified: Secondary | ICD-10-CM | POA: Diagnosis present

## 2018-08-03 DIAGNOSIS — K5909 Other constipation: Secondary | ICD-10-CM | POA: Diagnosis present

## 2018-08-03 DIAGNOSIS — Z1159 Encounter for screening for other viral diseases: Secondary | ICD-10-CM | POA: Diagnosis not present

## 2018-08-03 DIAGNOSIS — K598 Other specified functional intestinal disorders: Secondary | ICD-10-CM | POA: Diagnosis not present

## 2018-08-03 DIAGNOSIS — I10 Essential (primary) hypertension: Secondary | ICD-10-CM | POA: Diagnosis not present

## 2018-08-03 DIAGNOSIS — L98421 Non-pressure chronic ulcer of back limited to breakdown of skin: Secondary | ICD-10-CM | POA: Diagnosis not present

## 2018-08-03 DIAGNOSIS — Z209 Contact with and (suspected) exposure to unspecified communicable disease: Secondary | ICD-10-CM | POA: Diagnosis not present

## 2018-08-03 DIAGNOSIS — K5981 Ogilvie syndrome: Secondary | ICD-10-CM

## 2018-08-03 DIAGNOSIS — Z83511 Family history of glaucoma: Secondary | ICD-10-CM

## 2018-08-03 DIAGNOSIS — Z8349 Family history of other endocrine, nutritional and metabolic diseases: Secondary | ICD-10-CM

## 2018-08-03 DIAGNOSIS — A419 Sepsis, unspecified organism: Secondary | ICD-10-CM | POA: Diagnosis not present

## 2018-08-03 DIAGNOSIS — Z8249 Family history of ischemic heart disease and other diseases of the circulatory system: Secondary | ICD-10-CM

## 2018-08-03 DIAGNOSIS — Z03818 Encounter for observation for suspected exposure to other biological agents ruled out: Secondary | ICD-10-CM | POA: Diagnosis not present

## 2018-08-03 DIAGNOSIS — R58 Hemorrhage, not elsewhere classified: Secondary | ICD-10-CM | POA: Diagnosis not present

## 2018-08-03 DIAGNOSIS — L02212 Cutaneous abscess of back [any part, except buttock]: Secondary | ICD-10-CM | POA: Diagnosis not present

## 2018-08-03 DIAGNOSIS — Z4800 Encounter for change or removal of nonsurgical wound dressing: Secondary | ICD-10-CM | POA: Diagnosis not present

## 2018-08-03 DIAGNOSIS — K317 Polyp of stomach and duodenum: Secondary | ICD-10-CM | POA: Diagnosis present

## 2018-08-03 DIAGNOSIS — B966 Bacteroides fragilis [B. fragilis] as the cause of diseases classified elsewhere: Secondary | ICD-10-CM | POA: Diagnosis not present

## 2018-08-03 LAB — TYPE AND SCREEN
ABO/RH(D): A POS
Antibody Screen: NEGATIVE

## 2018-08-03 LAB — COMPREHENSIVE METABOLIC PANEL
ALT: 14 U/L (ref 0–44)
AST: 17 U/L (ref 15–41)
Albumin: 2.4 g/dL — ABNORMAL LOW (ref 3.5–5.0)
Alkaline Phosphatase: 56 U/L (ref 38–126)
Anion gap: 9 (ref 5–15)
BUN: 12 mg/dL (ref 6–20)
CO2: 29 mmol/L (ref 22–32)
Calcium: 8.5 mg/dL — ABNORMAL LOW (ref 8.9–10.3)
Chloride: 106 mmol/L (ref 98–111)
Creatinine, Ser: 0.51 mg/dL (ref 0.44–1.00)
GFR calc Af Amer: >60 mL/min (ref >60)
GFR calc non Af Amer: >60 mL/min (ref >60)
Glucose, Bld: 94 mg/dL (ref 70–99)
Potassium: 2.9 mmol/L — ABNORMAL LOW (ref 3.5–5.1)
Sodium: 144 mmol/L (ref 135–145)
Total Bilirubin: 0.5 mg/dL (ref 0.3–1.2)
Total Protein: 6 g/dL — ABNORMAL LOW (ref 6.5–8.1)

## 2018-08-03 LAB — CBC WITH DIFFERENTIAL/PLATELET
Abs Immature Granulocytes: 0.69 10*3/uL — ABNORMAL HIGH (ref 0.00–0.07)
Basophils Absolute: 0.3 10*3/uL — ABNORMAL HIGH (ref 0.0–0.1)
Basophils Relative: 2 %
Eosinophils Absolute: 0.2 10*3/uL (ref 0.0–0.5)
Eosinophils Relative: 1 %
HCT: 38.9 % (ref 36.0–46.0)
Hemoglobin: 11.8 g/dL — ABNORMAL LOW (ref 12.0–15.0)
Immature Granulocytes: 4 %
Lymphocytes Relative: 20 %
Lymphs Abs: 4.1 10*3/uL — ABNORMAL HIGH (ref 0.7–4.0)
MCH: 29.3 pg (ref 26.0–34.0)
MCHC: 30.3 g/dL (ref 30.0–36.0)
MCV: 96.5 fL (ref 80.0–100.0)
Monocytes Absolute: 2 10*3/uL — ABNORMAL HIGH (ref 0.1–1.0)
Monocytes Relative: 10 %
Neutro Abs: 12.7 10*3/uL — ABNORMAL HIGH (ref 1.7–7.7)
Neutrophils Relative %: 63 %
Platelets: 318 10*3/uL (ref 150–400)
RBC: 4.03 MIL/uL (ref 3.87–5.11)
RDW: 18.6 % — ABNORMAL HIGH (ref 11.5–15.5)
WBC: 19.9 10*3/uL — ABNORMAL HIGH (ref 4.0–10.5)
nRBC: 0.6 % — ABNORMAL HIGH (ref 0.0–0.2)

## 2018-08-03 LAB — SARS CORONAVIRUS 2 BY RT PCR (HOSPITAL ORDER, PERFORMED IN ~~LOC~~ HOSPITAL LAB): SARS Coronavirus 2: NEGATIVE

## 2018-08-03 LAB — POC OCCULT BLOOD, ED: Fecal Occult Bld: POSITIVE — AB

## 2018-08-03 LAB — MAGNESIUM: Magnesium: 1.9 mg/dL (ref 1.7–2.4)

## 2018-08-03 LAB — TSH: TSH: 2.731 u[IU]/mL (ref 0.350–4.500)

## 2018-08-03 MED ORDER — BENZONATATE 100 MG PO CAPS: 100.0000 mg | ORAL_CAPSULE | Freq: Two times a day (BID) | ORAL | Status: DC | PRN

## 2018-08-03 MED ORDER — SODIUM CHLORIDE 0.9 % IV SOLN
INTRAVENOUS | Status: DC | PRN
  Administered 2018-08-03: 250 mL via INTRAVENOUS

## 2018-08-03 MED ORDER — ACETAMINOPHEN 650 MG RE SUPP: 650.0000 mg | Freq: Four times a day (QID) | RECTAL | Status: DC | PRN

## 2018-08-03 MED ORDER — ENSURE MAX PROTEIN PO LIQD
11.0000 [oz_av] | Freq: Two times a day (BID) | ORAL | Status: DC
Start: 2018-08-03 — End: 2018-08-08
  Administered 2018-08-03 – 2018-08-07 (×5): 11 [oz_av] via ORAL

## 2018-08-03 MED ORDER — VITAMIN D (ERGOCALCIFEROL) 1.25 MG (50000 UNIT) PO CAPS: 50000.0000 [IU] | ORAL_CAPSULE | ORAL | Status: DC

## 2018-08-03 MED ORDER — PHENOBARBITAL 32.4 MG PO TABS
32.4000 mg | ORAL_TABLET | Freq: Two times a day (BID) | ORAL | Status: DC
  Administered 2018-08-03 – 2018-08-07 (×8): 32.4 mg via ORAL
  Filled 2018-08-03 (×8): qty 1

## 2018-08-03 MED ORDER — ACETAMINOPHEN 325 MG PO TABS: 650.0000 mg | ORAL_TABLET | Freq: Four times a day (QID) | ORAL | Status: DC | PRN

## 2018-08-03 MED ORDER — SIMVASTATIN 10 MG PO TABS
10.0000 mg | ORAL_TABLET | Freq: Every day | ORAL | Status: DC
  Administered 2018-08-03 – 2018-08-07 (×5): 10 mg via ORAL
  Filled 2018-08-03 (×5): qty 1

## 2018-08-03 MED ORDER — JUVEN PO PACK
1.0000 | PACK | Freq: Two times a day (BID) | ORAL | Status: DC
Start: 2018-08-04 — End: 2018-08-08
  Administered 2018-08-04 – 2018-08-07 (×6): 1 via ORAL
  Filled 2018-08-03 (×6): qty 1

## 2018-08-03 MED ORDER — POTASSIUM CHLORIDE 10 MEQ/100ML IV SOLN
10.0000 meq | INTRAVENOUS | Status: AC
  Administered 2018-08-03 – 2018-08-04 (×4): 10 meq via INTRAVENOUS
  Filled 2018-08-03 (×4): qty 100

## 2018-08-03 MED ORDER — MONTELUKAST SODIUM 10 MG PO TABS
10.0000 mg | ORAL_TABLET | Freq: Every day | ORAL | Status: DC
  Administered 2018-08-03 – 2018-08-07 (×5): 10 mg via ORAL
  Filled 2018-08-03 (×5): qty 1

## 2018-08-03 MED ORDER — SODIUM CHLORIDE 0.9 % IV BOLUS
500.0000 mL | Freq: Once | INTRAVENOUS | Status: AC
  Administered 2018-08-03: 500 mL via INTRAVENOUS

## 2018-08-03 MED ORDER — POTASSIUM CHLORIDE IN NACL 40-0.9 MEQ/L-% IV SOLN
INTRAVENOUS | Status: DC
  Administered 2018-08-04 – 2018-08-05 (×3): 100 mL/h via INTRAVENOUS

## 2018-08-03 MED ORDER — ONDANSETRON HCL 4 MG/2ML IJ SOLN: 4.0000 mg | Freq: Four times a day (QID) | INTRAMUSCULAR | Status: DC | PRN

## 2018-08-03 MED ORDER — ONDANSETRON HCL 4 MG PO TABS: 4.0000 mg | ORAL_TABLET | Freq: Four times a day (QID) | ORAL | Status: DC | PRN

## 2018-08-03 MED ORDER — ARGIMENT PO PACK: PACK | ORAL | 1 refills | Status: DC

## 2018-08-03 MED ORDER — DM-GUAIFENESIN ER 30-600 MG PO TB12
1.0000 | ORAL_TABLET | Freq: Two times a day (BID) | ORAL | Status: DC
  Administered 2018-08-03 – 2018-08-07 (×8): 1 via ORAL
  Filled 2018-08-03 (×8): qty 1

## 2018-08-03 MED ORDER — PANTOPRAZOLE SODIUM 20 MG PO TBEC
20.0000 mg | DELAYED_RELEASE_TABLET | Freq: Every day | ORAL | Status: DC
Start: 2018-08-04 — End: 2018-08-04
  Filled 2018-08-03 (×2): qty 1

## 2018-08-03 MED ORDER — POTASSIUM CHLORIDE 10 MEQ/100ML IV SOLN
10.0000 meq | Freq: Once | INTRAVENOUS | Status: AC
  Administered 2018-08-03: 10 meq via INTRAVENOUS
  Filled 2018-08-03: qty 100

## 2018-08-03 NOTE — TOC Initial Note (Signed)
Transition of Care [facility]) - Initial/Assessment Note    Patient Details  Name: April Watson MRN: [id number] Date of Birth: [birth year]  Transition of Care [facility]) CM/SW Contact:    Fatim Vanderschaaf Sherryle Lis, LCSW Phone Number: 08/03/2018, 8:57 PM  Clinical Narrative:     CSW at bedside, engaged in conversation with Nani Ravens, Pts sister and caregiver. Bonita Quin explains that pt is currently receiving home health aid services from [facility] care.   Pt has the following DME currently in the home: wheelchair, hoyer lift, and wound Vac.   Pt is non verbal and does not ambulate.   CSW will continue to follow pt for any discharge needs.  Lavoris Sparling Sherryle Lis LCSWA Transitions of Care  Clinical Social Worker  Ph: [phone]                Expected Discharge Plan: Home w Home Health Services Barriers to Discharge: Continued Medical Work up   Patient Goals and CMS Choice Patient states their goals for this hospitalization and ongoing recovery are:: to return home with [facility] services from [facility]      Expected Discharge Plan and Services Expected Discharge Plan: Home w Home Health Services In-house Referral: Clinical Social Work   Post Acute Care Choice: Durable Medical Equipment, Home Health, Resumption of Svcs/PTA Provider Living arrangements for the past 2 months: Single Family Home                             HH Agency: [facility]        Prior Living Arrangements/Services Living arrangements for the past 2 months: Single Family Home Lives with:: Relatives Patient language and need for interpreter reviewed:: Yes Do you feel safe going back to the place where you live?: Yes      Need for Family Participation in Patient Care: Yes (Comment) Care giver support system in place?: Yes (comment) Current home services: DME, Homehealth aide Criminal Activity/Legal Involvement Pertinent to Current Situation/Hospitalization: No - Comment as needed  Activities of Daily  Living      Permission Sought/Granted Permission sought to share information with : Case Manager, Guardian Permission granted to share information with : Yes, Release of Information Signed  Share Information with NAME: Nani Ravens  Permission granted to share info w AGENCY: [facility]  Permission granted to share info w Relationship: Family member/Gaurdian  Permission granted to share info w Contact Information: Nani Ravens ph: [phone]  Emotional Assessment Appearance:: Developmentally appropriate Attitude/Demeanor/Rapport: Unable to Assess Affect (typically observed): Unable to Assess   Alcohol / Substance Use: Not Applicable Psych Involvement: No (comment)  Admission diagnosis:  blood in stool Patient Active Problem List   Diagnosis Date Noted  . Rectal bleeding 08/03/2018  . Hypokalemia 08/03/2018  . Hospital discharge follow-up 08/02/2018  . Poor appetite 08/02/2018  . Decubitus ulcer of back, stage 3 (HCC)   . Left ischial pressure sore, stage II (HCC)   . Abscess   . Abdominal distension 07/20/2018  . Weakness 07/20/2018  . Sepsis due to cellulitis (HCC) 07/20/2018  . Seizure disorder (HCC)   . Sacral ulcer, limited to breakdown of skin (HCC) 04/06/2018  . Dermatomycosis 04/06/2018  . Quadriplegia and quadriparesis (HCC) 09/11/2016  . Decubitus ulcer of sacral region, stage 2 (HCC) 08/22/2015  . Tinea cruris 06/05/2015  . Vitamin D deficiency 01/21/2014  . 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 Allergic cough 11/17/2012  . Family history of colon cancer 05/19/2011  .  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 COLONIC POLYPS, ADENOMATOUS, HX OF 11/30/2008  . GERD 11/29/2008  . Hyperlipidemia LDL goal <100 08/17/2007  . Essential hypertension 08/17/2007  . Chronic constipation 08/17/2007  . 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 Convulsions (HCC) 08/17/2007   PCP:  Kerri Perches, MD Pharmacy:   [facility]  [place], [place] - [address] [place] [address] Phone: [phone] Fax: [phone]     Social Determinants of Health (SDOH) Interventions    Readmission Risk Interventions Readmission Risk Prevention Plan 07/26/2018 07/25/2018  Transportation Screening Complete Complete  PCP or Specialist Appt within 5-7 Days Complete -  Home Care Screening Complete Complete  Medication Review (RN CM) Complete -  Some recent data might be hidden

## 2018-08-03 NOTE — Telephone Encounter (Signed)
noted 

## 2018-08-03 NOTE — ED Notes (Signed)
Unable to start IV due to lack of peripheral veins.

## 2018-08-03 NOTE — ED Notes (Signed)
Inserted flexi seal.

## 2018-08-03 NOTE — Progress Notes (Signed)
Received order for PICC.  Informed primary RN that PICC will be placed as soon as PICC nurse is available.  If access needed more urgently may consider CVC if unable to obtain other type of access.

## 2018-08-03 NOTE — Addendum Note (Signed)
Addended by: Eual Fines on: 08/03/2018 12:04 PM   Modules accepted: Orders

## 2018-08-03 NOTE — ED Notes (Signed)
Attempted report on pt. Nurse unavailable at this time.

## 2018-08-03 NOTE — ED Notes (Addendum)
Pt family to nurses station asking questions about paperwork that was given by SW. Informed pt family I was not sure and did not want to give her the wrong information. Pt family made rude comment back to this nurse. This nurse informed pt family that SW has been paged to come back down and talk with her.

## 2018-08-03 NOTE — Telephone Encounter (Signed)
Inez Catalina with [facility] called just wanted to let Dr. Moshe Cipro know that she could not draw Ms. Moores blood today the sister was going to push fluids and she was going to go back out and try again Friday to redraw blood for labs.

## 2018-08-03 NOTE — Telephone Encounter (Signed)
Sent a Pharmacist, community message since unable to contact [facility] on the phone. WIll await response, [name] requesting purewick catheter suction system. Verbal ok given by dr Moshe Cipro. Asked if she wanted order sent to [facility]. Will await response

## 2018-08-03 NOTE — H&P (Signed)
History and Physical    April Watson BPZ:[id number] DOB: 1960-12-17 DOA: 08/03/2018  PCP: Fayrene Helper, MD  Patient coming from: Home  I have personally briefly reviewed patient's old medical records in [place]  Chief Complaint: Blood per rectum  HPI: April Watson is a 58 y.o. female with medical history significant of high-grade rectum dysplasia, congenital hydrocephalus, MR, GERD, hypertension,, quadriplegia, hyperlipidemia, seizure disorder, chronic constipation was brought into the emergency department due to blood per rectum.  Due to patient's congenital hydrocephalus and mental retardation, history was obtained from patient's sister who was present at the bedside and who is the sole caretaker of the patient as well.  According to her, patient started having blood per rectum at around 10 AM this morning.  She had approximately 4 bowel movements within an hour and all of them were bloody.  They called home health nurse and they were advised to come to the emergency department.  Sister did not notice any other symptom and the patient.  Patient has remained afebrile.  Sister is concerned about patient's abdominal distention which has been ongoing since her last admission on 07/20/2018 when patient was hospitalized for 7 days for sepsis due to cellulitis and stage II decubitus ulcer of sacral region during which hospitalization, patient underwent significant debridement and was sent home with wound VAC and on oral antibiotics which she completed today.  ED Course: Upon arrival to the emergency department, patient was hemodynamically stable.  She was positive for fecal occult blood test.  CBC showed hemoglobin of 11.8 which is in fact slightly higher than her baseline (between 10 and 11), mild leukocytosis (which is chronic), BMP showed potassium of 2.9.  She was tested negative for COVID-19.  PICC line was placed due to having difficulty with obtaining peripheral IV line.  CT abdomen  and pelvis was done without contrast which showed extremely distended colon particularly the sigmoid which had similar appearance compared to the recent CT abdomen related to chronic pseudoobstruction.  There was no colonic volvulus.  Due to having frequent bowel movements, rectal tube was placed.  Review of Systems: As per HPI otherwise 10 point review of systems negative.    Past Medical History:  Diagnosis Date   Adenomatous polyp of rectum    high grade dysplasia, 2005   Chronic constipation    Congenital hydrocephalus (HCC)    GERD (gastroesophageal reflux disease)    Hyperlipidemia    Hypertension    Mental retardation    Seizure disorder ([place])    Seizures ([place])     Past Surgical History:  Procedure Laterality Date   COLONOSCOPY  02/08/06   Rourk-mild anal stenosis, multiple erosions o fprox rectum and sigmoid. Long redundant colon. Bx  ?ischemic colitis   COLONOSCOPY  06/12/2011   Procedure: COLONOSCOPY;  Surgeon: Daneil Dolin, MD;  Location: [facility];  Service: Endoscopy;  Laterality: N/A;  11:00   FLEXIBLE SIGMOIDOSCOPY  08/14/2003   Rourk- Normal-appearing rectum, normal-appearing sigmoid colon to 50  cm.  Relatively poor prep made exam more difficult   patient reports that they had brain surgery attempted for hydrocephalus in infancy. However , the procedure was aborted because she coded. .       reports that she has never smoked. She has never used smokeless tobacco. She reports that she does not drink alcohol or use drugs.  No Known Allergies  Family History  Problem Relation Age of Onset   Kidney failure Mother  on dialysis   Hypertension Mother    Kidney disease Mother    Prostate cancer Father    Hypertension Father    Glaucoma Father    Colon cancer Sister 79   Multiple myeloma Sister    Hypertension Sister    Glaucoma Sister    Allergies Sister    Hypertension Sister    Glaucoma Sister    Hyperlipidemia Sister      Hypertension Brother    Allergies Brother    Hyperlipidemia Brother    Colon cancer Other        family history    Lymphoma Other        family history     Prior to Admission medications   Medication Sig Start Date End Date Taking? Authorizing Provider  benzonatate (TESSALON) 100 MG capsule Take by mouth 2 (two) times daily as needed for cough.   Yes [provider]  cefdinir (OMNICEF) 300 MG capsule Take 1 capsule (300 mg total) by mouth 2 (two) times daily for 7 days. 07/27/18 08/03/18 Yes Shah, Pratik D, DO  dextromethorphan-guaiFENesin (MUCINEX DM) 30-600 MG 12hr tablet Take 1 tablet by mouth 2 (two) times daily. 07/27/18 08/26/18 Yes Shah, Pratik D, DO  docusate sodium (STOOL SOFTENER) 100 MG capsule TAKE 1 CAPSULE TWICE DAILY FOR CONSTIPATION. Patient taking differently: Take 100 mg by mouth 2 (two) times daily.  06/19/14  Yes Fayrene Helper, MD  Ensure Max Protein (ENSURE MAX PROTEIN) LIQD Take 330 mLs (11 oz total) by mouth 2 (two) times daily. 07/27/18 08/26/18 Yes Shah, Pratik D, DO  metroNIDAZOLE (FLAGYL) 500 MG tablet Take 1 tablet (500 mg total) by mouth every 8 (eight) hours for 7 days. 07/27/18 08/03/18 Yes Shah, Pratik D, DO  montelukast (SINGULAIR) 10 MG tablet Take 1 tablet (10 mg total) by mouth at bedtime. 04/06/18  Yes Fayrene Helper, MD  pantoprazole (PROTONIX) 20 MG tablet Take 1 tablet (20 mg total) by mouth daily. 05/24/18  Yes Fayrene Helper, MD  PHENobarbital (LUMINAL) 32.4 MG tablet TAKE ONE TABLET BY MOUTH TWICE DAILY FOR SEIZURE DISORDER. Patient taking differently: Take 32.4 mg by mouth 2 (two) times daily. FOR SEIZURE DISORDER. 03/30/18  Yes Fayrene Helper, MD  polyethylene glycol powder (GLYCOLAX/MIRALAX) powder MIX 1 CAPFUL (17 G) IN 8 OUNCES OF WATER/JUICE AND DRINK ONCE DAILY FOR CONSTIPATION. Patient taking differently: Take 17 g by mouth See admin instructions. MIX 1 CAPFUL (17 G) IN 8 OUNCES OF WATER/JUICE AND DRINK ONCE DAILY FOR  CONSTIPATION. 11/11/16  Yes Fayrene Helper, MD  potassium chloride (K-DUR) 10 MEQ tablet TAKE (1) TABLET BY MOUTH TWICE DAILY WITH FOOD. Patient taking differently: Take 10 mEq by mouth 2 (two) times daily with a meal.  07/20/18  Yes Fayrene Helper, MD  simvastatin (ZOCOR) 10 MG tablet TAKE (1) TABLET BY MOUTH AT BEDTIME FOR CHOLESTEROL. Patient taking differently: Take 10 mg by mouth at bedtime.  04/28/18  Yes Fayrene Helper, MD  Vitamin D, Ergocalciferol, (DRISDOL) 1.25 MG (50000 UT) CAPS capsule TAKE 1 CAPSULE BY MOUTH EVERY 2 WEEKS. Patient taking differently: Take 50,000 Units by mouth every 14 (fourteen) days.  04/28/18  Yes Fayrene Helper, MD  Amino Acids (ARGIMENT) PACK Mix one packet with 6 oz juice or water twice daily Patient not taking: Reported on 08/03/2018 08/03/18   Fayrene Helper, MD  nutrition supplement, JUVEN, (JUVEN) PACK Take 1 packet by mouth 2 (two) times daily between meals. Patient not taking:  Reported on 08/03/2018 07/27/18 08/26/18  Heath Lark D, DO    Physical Exam: Vitals:   08/03/18 1204 08/03/18 1300 08/03/18 1400 08/03/18 1415  BP:  118/71 108/65   Pulse:  (!) 105  (!) 109  Resp:  18    Temp:      TempSrc:      SpO2:  96%  95%  Weight: 77.1 kg     Height: '5\' 2"'  (1.575 m)       Constitutional: NAD, calm, comfortable Vitals:   08/03/18 1204 08/03/18 1300 08/03/18 1400 08/03/18 1415  BP:  118/71 108/65   Pulse:  (!) 105  (!) 109  Resp:  18    Temp:      TempSrc:      SpO2:  96%  95%  Weight: 77.1 kg     Height: '5\' 2"'  (1.575 m)      Eyes: PERRL, lids and conjunctivae normal ENMT: Mucous membranes are moist. Posterior pharynx clear of any exudate or lesions.Normal dentition.  Neck: normal, supple, no masses, no thyromegaly Respiratory: clear to auscultation bilaterally, no wheezing, no crackles. Normal respiratory effort. No accessory muscle use.  Cardiovascular: Regular rate and rhythm, no murmurs / rubs / gallops. No extremity  edema. 2+ pedal pulses. No carotid bruits.  Abdomen: no tenderness, no masses palpated. No hepatosplenomegaly. Bowel sounds positive.  Moderate distention but soft.  Rectal tube has small amount of blood in it. Skin: Has wound VAC attached at the stage II decubitus sacral ulcer Neurologic: Quadriplegic Psychiatric: Unable to assess due to nonverbal status.   Labs on Admission: I have personally reviewed following labs and imaging studies  CBC: Recent Labs  Lab 08/03/18 1428  WBC 19.9*  NEUTROABS 12.7*  HGB 11.8*  HCT 38.9  MCV 96.5  PLT 062   Basic Metabolic Panel: Recent Labs  Lab 08/03/18 1313  NA 144  K 2.9*  CL 106  CO2 29  GLUCOSE 94  BUN 12  CREATININE 0.51  CALCIUM 8.5*   GFR: Estimated Creatinine Clearance: 73.7 mL/min (by C-G formula based on SCr of 0.51 mg/dL). Liver Function Tests: Recent Labs  Lab 08/03/18 1313  AST 17  ALT 14  ALKPHOS 56  BILITOT 0.5  PROT 6.0*  ALBUMIN 2.4*   No results for input(s): LIPASE, AMYLASE in the last 168 hours. No results for input(s): AMMONIA in the last 168 hours. Coagulation Profile: No results for input(s): INR, PROTIME in the last 168 hours. Cardiac Enzymes: No results for input(s): CKTOTAL, CKMB, CKMBINDEX, TROPONINI in the last 168 hours. BNP (last 3 results) No results for input(s): PROBNP in the last 8760 hours. HbA1C: No results for input(s): HGBA1C in the last 72 hours. CBG: No results for input(s): GLUCAP in the last 168 hours. Lipid Profile: No results for input(s): CHOL, HDL, LDLCALC, TRIG, CHOLHDL, LDLDIRECT in the last 72 hours. Thyroid Function Tests: No results for input(s): TSH, T4TOTAL, FREET4, T3FREE, THYROIDAB in the last 72 hours. Anemia Panel: No results for input(s): VITAMINB12, FOLATE, FERRITIN, TIBC, IRON, RETICCTPCT in the last 72 hours. Urine analysis: No results found for: COLORURINE, APPEARANCEUR, [place], [place], GLUCOSEU, HGBUR, BILIRUBINUR, KETONESUR, PROTEINUR, UROBILINOGEN,  NITRITE, LEUKOCYTESUR  Radiological Exams on Admission: Ct Abdomen Pelvis Wo Contrast  Result Date: 08/03/2018 CLINICAL DATA:  Abdominal distension, bloody stools, wound VAC EXAM: CT ABDOMEN AND PELVIS WITHOUT CONTRAST TECHNIQUE: Multidetector CT imaging of the abdomen and pelvis was performed following the standard protocol without IV contrast. COMPARISON:  07/20/2018 FINDINGS: Lower chest: No  acute abnormality. Hepatobiliary: No solid liver abnormality is seen. Numerous gallstones. No gallbladder wall thickening, or biliary dilatation. Pancreas: Unremarkable. No pancreatic ductal dilatation or surrounding inflammatory changes. Spleen: Normal in size without significant abnormality. Adrenals/Urinary Tract: Adrenal glands are unremarkable. Kidneys are normal, without renal calculi, solid lesion, or hydronephrosis. Bladder is unremarkable. Stomach/Bowel: Stomach is within normal limits. Normal appendix. 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 Rectal tube in position. There is no obvious evidence of colonic volvulus. Vascular/Lymphatic: No significant vascular findings are present. No enlarged abdominal or pelvic lymph nodes. Reproductive: No mass or other significant abnormality. Other: No abdominal wall hernia or abnormality. No abdominopelvic ascites. Musculoskeletal: Thoracolumbar levoscoliosis and nonweightbearing deformities of the bilateral hips. Diffuse sarcopenia. There has been interval drainage of a large fluid collection about the left back and flank with placement of a wound VAC. IMPRESSION: 1. 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 There is no obvious evidence of colonic volvulus. Rectal tube in position. 2. There has been interval  drainage of a large fluid collection about the left back and flank with placement of a wound VAC. 3.  Other chronic and incidental findings as detailed above. Electronically Signed   By: Eddie Candle M.D.   On: 08/03/2018 15:59    Assessment/Plan Active Problems:   Essential hypertension   Type 2 diabetes mellitus with neurological complications (HCC)   Quadriplegia and quadriparesis (HCC)   Sacral ulcer, limited to breakdown of skin (HCC)   Seizure disorder ([place])   Decubitus ulcer of back, stage 3 (HCC)   Rectal bleeding   Hypokalemia     Bright red blood per rectum: At this point in time, patient's hemoglobin is better than her baseline and is a stable.  I will recheck her CBC at 11 PM today and tomorrow morning.  If she continues to have persistent bleeding and/or drops her hemoglobin then we will consider consulting GI for possible colonoscopy however at this point in time she is stable so we will forego that.  Transfuse if hemoglobin drops less than 7.  She is on daily MiraLAX and Colace which I will hold.  GERD: Resume omeprazole.  Seizure disorder: Resume home antiepileptic medications.  Hypokalemia: 2.9.  Will replace and recheck in the morning.  DVT prophylaxis: SCD/avoiding heparin products due to GI bleed Code Status: Full code per sister.  She is going to discuss [place] with her brother and will let us know if they consider changing to DNR. Family Communication: Discussed with sister who is at the bedside. Disposition Plan: Likely home in next 1 to 2 days. Consults called: None Admission status: Observation   Darliss Cheney MD [facility] Pager [phone]  If 7PM-7AM, please contact night-coverage [email or web address] Password [email or web address]  08/03/2018, 7:14 PM

## 2018-08-03 NOTE — Care Management Obs Status (Signed)
[place] NOTIFICATION   Patient Details  Name: April Watson MRN: [id number] Date of Birth: 22-Dec-1960   Medicare Observation Status Notification Given:  Yes(Notice delivered to [facility], Pts caregiver.)    [place], LCSW 08/03/2018, 8:53 PM

## 2018-08-03 NOTE — ED Triage Notes (Signed)
PT's home health nurse reported pt had blood in stool today. Pt was reportedly seen at [facility] a few weeks ago for constipation per EMS.  Pt has hx of hydrocephalus and is nonverbal.   Patient presents with wound vac to back area.

## 2018-08-03 NOTE — ED Provider Notes (Addendum)
[facility] EMERGENCY DEPARTMENT Provider Note   CSN: [id number] Arrival date & time: 08/03/18  1147     History   Chief Complaint Chief Complaint  Patient presents with  . Melena    HPI April Watson is a 58 y.o. female.     Mother states the patient has had numerous bouts of room colored stool.   Rectal Bleeding Quality:  Maroon Amount:  Moderate Timing:  Constant Context: not anal fissures   Similar prior episodes: no   Relieved by:  Nothing Worsened by:  Nothing Ineffective treatments:  None tried Associated symptoms: no abdominal pain   Risk factors: no anticoagulant use     Past Medical History:  Diagnosis Date  . 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 Chronic constipation   . Congenital hydrocephalus ([place])   . GERD (gastroesophageal reflux disease)   . Hyperlipidemia   . Hypertension   . Mental retardation   . Seizure disorder ([place])   . Seizures [facility])     Patient Active Problem List   Diagnosis Date Noted  . Hospital discharge follow-up 08/02/2018  . Poor appetite 08/02/2018  . Decubitus ulcer of back, stage 3 ([place])   . Left ischial pressure sore, stage II ([place])   . Abscess   . Abdominal distension 07/20/2018  . Weakness 07/20/2018  . Sepsis due to cellulitis ([place]) 07/20/2018  . Seizure disorder ([place])   . Sacral ulcer, limited to breakdown of skin ([place]) 04/06/2018  . Dermatomycosis 04/06/2018  . Quadriplegia and quadriparesis ([place]) 09/11/2016  . Decubitus ulcer of sacral region, stage 2 ([place]) 08/22/2015  . Tinea cruris 06/05/2015  . Vitamin D deficiency 01/21/2014  . 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 Allergic cough 11/17/2012  . Family history of colon cancer 05/19/2011  . Type 2 diabetes mellitus with neurological complications ([place]) 31/51/7616  . COLONIC POLYPS, ADENOMATOUS, HX OF 11/30/2008  . GERD 11/29/2008  . Hyperlipidemia LDL goal <100 08/17/2007  . Essential hypertension 08/17/2007  . Chronic  constipation 08/17/2007  . 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 Convulsions ([place]) 08/17/2007    Past Surgical History:  Procedure Laterality Date  . COLONOSCOPY  02/08/06   Rourk-mild anal stenosis, multiple erosions o fprox rectum and sigmoid. Long redundant colon. Bx  ?ischemic colitis  . COLONOSCOPY  06/12/2011   Procedure: COLONOSCOPY;  Surgeon: Daneil Dolin, MD;  Location: [facility] ENDO SUITE;  Service: Endoscopy;  Laterality: N/A;  11:00  . FLEXIBLE SIGMOIDOSCOPY  08/14/2003   Rourk- Normal-appearing rectum, normal-appearing sigmoid colon to 50  cm.  Relatively poor prep made exam more difficult  . patient reports that they had brain surgery attempted for hydrocephalus in infancy. However , the procedure was aborted because she coded. .       OB History   No obstetric history on file.      Home Medications    Prior to Admission medications   Medication Sig Start Date End Date Taking? Authorizing Provider  benzonatate (TESSALON) 100 MG capsule Take by mouth 2 (two) times daily as needed for cough.   Yes [provider]  cefdinir (OMNICEF) 300 MG capsule Take 1 capsule (300 mg total) by mouth 2 (two) times daily for 7 days. 07/27/18 08/03/18 Yes Shah, Pratik D, DO  dextromethorphan-guaiFENesin (MUCINEX DM) 30-600 MG 12hr tablet Take 1 tablet by mouth 2 (two) times daily. 07/27/18 08/26/18 Yes Shah, Pratik D, DO  docusate sodium (STOOL SOFTENER) 100 MG capsule TAKE 1 CAPSULE TWICE  DAILY FOR CONSTIPATION. Patient taking differently: Take 100 mg by mouth 2 (two) times daily.  06/19/14  Yes Fayrene Helper, MD  Ensure Max Protein (ENSURE MAX PROTEIN) LIQD Take 330 mLs (11 oz total) by mouth 2 (two) times daily. 07/27/18 08/26/18 Yes Shah, Pratik D, DO  metroNIDAZOLE (FLAGYL) 500 MG tablet Take 1 tablet (500 mg total) by mouth every 8 (eight) hours for 7 days. 07/27/18 08/03/18 Yes Shah, Pratik D, DO  montelukast (SINGULAIR) 10 MG tablet Take 1 tablet (10 mg total) by mouth at bedtime.  04/06/18  Yes Fayrene Helper, MD  pantoprazole (PROTONIX) 20 MG tablet Take 1 tablet (20 mg total) by mouth daily. 05/24/18  Yes Fayrene Helper, MD  PHENobarbital (LUMINAL) 32.4 MG tablet TAKE ONE TABLET BY MOUTH TWICE DAILY FOR SEIZURE DISORDER. Patient taking differently: Take 32.4 mg by mouth 2 (two) times daily. FOR SEIZURE DISORDER. 03/30/18  Yes Fayrene Helper, MD  polyethylene glycol powder (GLYCOLAX/MIRALAX) powder MIX 1 CAPFUL (17 G) IN 8 OUNCES OF WATER/JUICE AND DRINK ONCE DAILY FOR CONSTIPATION. Patient taking differently: Take 17 g by mouth See admin instructions. MIX 1 CAPFUL (17 G) IN 8 OUNCES OF WATER/JUICE AND DRINK ONCE DAILY FOR CONSTIPATION. 11/11/16  Yes Fayrene Helper, MD  potassium chloride (K-DUR) 10 MEQ tablet TAKE (1) TABLET BY MOUTH TWICE DAILY WITH FOOD. Patient taking differently: Take 10 mEq by mouth 2 (two) times daily with a meal.  07/20/18  Yes Fayrene Helper, MD  simvastatin (ZOCOR) 10 MG tablet TAKE (1) TABLET BY MOUTH AT BEDTIME FOR CHOLESTEROL. Patient taking differently: Take 10 mg by mouth at bedtime.  04/28/18  Yes Fayrene Helper, MD  Vitamin D, Ergocalciferol, (DRISDOL) 1.25 MG (50000 UT) CAPS capsule TAKE 1 CAPSULE BY MOUTH EVERY 2 WEEKS. Patient taking differently: Take 50,000 Units by mouth every 14 (fourteen) days.  04/28/18  Yes Fayrene Helper, MD  Amino Acids (ARGIMENT) PACK Mix one packet with 6 oz juice or water twice daily Patient not taking: Reported on 08/03/2018 08/03/18   Fayrene Helper, MD  nutrition supplement, JUVEN, (JUVEN) PACK Take 1 packet by mouth 2 (two) times daily between meals. Patient not taking: Reported on 08/03/2018 07/27/18 08/26/18  Heath Lark D, DO    Family History Family History  Problem Relation Age of Onset  . Kidney failure Mother        on dialysis  . Hypertension Mother   . Kidney disease Mother   . Prostate cancer Father   . Hypertension Father   . Glaucoma Father   . Colon cancer  Sister 85  . Multiple myeloma Sister   . Hypertension Sister   . Glaucoma Sister   . Allergies Sister   . Hypertension Sister   . Glaucoma Sister   . Hyperlipidemia Sister   . Hypertension Brother   . Allergies Brother   . Hyperlipidemia Brother   . Colon cancer Other        family history   . Lymphoma Other        family history     Social History Social History   Tobacco Use  . Smoking status: Never Smoker  . Smokeless tobacco: Never Used  Substance Use Topics  . Alcohol use: No  . Drug use: No     Allergies   Patient has no known allergies.   Review of Systems Review of Systems  Unable to perform ROS: Dementia  Gastrointestinal: Positive for hematochezia. Negative for abdominal  pain.     Physical Exam Updated Vital Signs BP 118/71   Pulse (!) 105   Temp 98.4 F (36.9 C) (Oral)   Resp 18   Ht _0  (1.575 m)   Wt 77.1 kg   SpO2 96%   BMI 31.09 kg/m   Physical Exam Vitals signs and nursing note reviewed.  Constitutional:      Appearance: She is well-developed.  HENT:     Head: Normocephalic.     Nose: Nose normal.  Eyes:     General: No scleral icterus.    Conjunctiva/sclera: Conjunctivae normal.  Neck:     Musculoskeletal: Neck supple.     Thyroid: No thyromegaly.  Cardiovascular:     Rate and Rhythm: Regular rhythm.     Heart sounds: No murmur. No friction rub. No gallop.      Comments: Mild tachycardia Pulmonary:     Breath sounds: No stridor. No wheezing or rales.  Chest:     Chest wall: No tenderness.  Abdominal:     General: There is no distension.     Tenderness: There is no abdominal tenderness. There is no rebound.  Genitourinary:    Comments: Rectal exam dark brown stool heme positive Musculoskeletal: Normal range of motion.  Lymphadenopathy:     Cervical: No cervical adenopathy.  Skin:    Findings: No erythema or rash.  Neurological:     Motor: No abnormal muscle tone.     Coordination: Coordination normal.      Comments: Patient alert and able to speak this is her normal.  Psychiatric:        Behavior: Behavior normal.      ED Treatments / Results  Labs (all labs ordered are listed, but only abnormal results are displayed) Labs Reviewed  COMPREHENSIVE METABOLIC PANEL - Abnormal; Notable for the following components:      Result Value   Potassium 2.9 (*)    Calcium 8.5 (*)    Total Protein 6.0 (*)    Albumin 2.4 (*)    All other components within normal limits  CBC WITH DIFFERENTIAL/PLATELET - Abnormal; Notable for the following components:   WBC 19.9 (*)    Hemoglobin 11.8 (*)    RDW 18.6 (*)    nRBC 0.6 (*)    Neutro Abs 12.7 (*)    Lymphs Abs 4.1 (*)    Monocytes Absolute 2.0 (*)    Basophils Absolute 0.3 (*)    Abs Immature Granulocytes 0.69 (*)    All other components within normal limits  POC OCCULT BLOOD, ED - Abnormal; Notable for the following components:   Fecal Occult Bld POSITIVE (*)    All other components within normal limits  SARS CORONAVIRUS 2 (HOSPITAL ORDER, [place] LAB)  CBC WITH DIFFERENTIAL/PLATELET  TYPE AND SCREEN    EKG None  Radiology No results found.  Procedures Aspiration of blood/fluid  Date/Time: 08/04/2018 11:53 AM Performed by: Milton Ferguson, MD Authorized by: Milton Ferguson, MD  Comments: Patient had a femoral stick done to draw blood on her right femoral vein.  Area was cleaned thoroughly with Betadine.  20 cc and an 18-gauge needle used to draw blood.  Tolerated the procedure well     (including critical care time)  Medications Ordered in ED Medications  sodium chloride 0.9 % bolus 500 mL (has no administration in time range)  potassium chloride 10 mEq in 100 mL IVPB (has no administration in time range)  Initial Impression / Assessment and Plan / ED Course  I have reviewed the triage vital signs and the nursing notes.  Pertinent labs & imaging results that were available during my care of the  patient were reviewed by me and considered in my medical decision making (see chart for details).    CRITICAL CARE Performed by: Milton Ferguson Total critical care time:35mnutes Critical care time was exclusive of separately billable procedures and treating other patients. Critical care was necessary to treat or prevent imminent or life-threatening deterioration. Critical care was time spent personally by me on the following activities: development of treatment plan with patient and/or surrogate as well as nursing, discussions with consultants, evaluation of patient's response to treatment, examination of patient, obtaining history from patient or surrogate, ordering and performing treatments and interventions, ordering and review of laboratory studies, ordering and review of radiographic studies, pulse oximetry and re-evaluation of patient's condition.       Patient with rectal bleeding.  CT scan pending.  She will be admitted.    Final Clinical Impressions(s) / ED Diagnoses   Final diagnoses:  None    ED Discharge Orders    None       ZMilton Ferguson MD 08/04/18 1008    ZMilton Ferguson MD 08/04/18 1154

## 2018-08-03 NOTE — Telephone Encounter (Signed)
mychart message that April Watson was being admitted to the hospital again per family

## 2018-08-04 ENCOUNTER — Telehealth: Payer: Self-pay | Admitting: *Deleted

## 2018-08-04 DIAGNOSIS — L89103 Pressure ulcer of unspecified part of back, stage 3: Secondary | ICD-10-CM | POA: Diagnosis not present

## 2018-08-04 DIAGNOSIS — G40909 Epilepsy, unspecified, not intractable, without status epilepticus: Secondary | ICD-10-CM | POA: Diagnosis not present

## 2018-08-04 DIAGNOSIS — G8250 Quadriplegia, unspecified: Secondary | ICD-10-CM | POA: Diagnosis not present

## 2018-08-04 DIAGNOSIS — I10 Essential (primary) hypertension: Secondary | ICD-10-CM | POA: Diagnosis not present

## 2018-08-04 DIAGNOSIS — E1149 Type 2 diabetes mellitus with other diabetic neurological complication: Secondary | ICD-10-CM

## 2018-08-04 DIAGNOSIS — E876 Hypokalemia: Secondary | ICD-10-CM | POA: Diagnosis not present

## 2018-08-04 LAB — COMPREHENSIVE METABOLIC PANEL
ALT: 12 U/L (ref 0–44)
AST: 13 U/L — ABNORMAL LOW (ref 15–41)
Albumin: 2.2 g/dL — ABNORMAL LOW (ref 3.5–5.0)
Alkaline Phosphatase: 49 U/L (ref 38–126)
Anion gap: 10 (ref 5–15)
BUN: 12 mg/dL (ref 6–20)
CO2: 28 mmol/L (ref 22–32)
Calcium: 8.3 mg/dL — ABNORMAL LOW (ref 8.9–10.3)
Chloride: 106 mmol/L (ref 98–111)
Creatinine, Ser: 0.49 mg/dL (ref 0.44–1.00)
GFR calc Af Amer: >60 mL/min (ref >60)
GFR calc non Af Amer: >60 mL/min (ref >60)
Glucose, Bld: 75 mg/dL (ref 70–99)
Potassium: 3.5 mmol/L (ref 3.5–5.1)
Sodium: 144 mmol/L (ref 135–145)
Total Bilirubin: 0.9 mg/dL (ref 0.3–1.2)
Total Protein: 5.6 g/dL — ABNORMAL LOW (ref 6.5–8.1)

## 2018-08-04 LAB — CBC
HCT: 31.9 % — ABNORMAL LOW (ref 36.0–46.0)
HCT: 33.4 % — ABNORMAL LOW (ref 36.0–46.0)
Hemoglobin: 10 g/dL — ABNORMAL LOW (ref 12.0–15.0)
Hemoglobin: 9.4 g/dL — ABNORMAL LOW (ref 12.0–15.0)
MCH: 28.8 pg (ref 26.0–34.0)
MCH: 29 pg (ref 26.0–34.0)
MCHC: 29.5 g/dL — ABNORMAL LOW (ref 30.0–36.0)
MCHC: 29.9 g/dL — ABNORMAL LOW (ref 30.0–36.0)
MCV: 96.8 fL (ref 80.0–100.0)
MCV: 97.9 fL (ref 80.0–100.0)
Platelets: 354 10*3/uL (ref 150–400)
Platelets: 372 10*3/uL (ref 150–400)
RBC: 3.26 MIL/uL — ABNORMAL LOW (ref 3.87–5.11)
RBC: 3.45 MIL/uL — ABNORMAL LOW (ref 3.87–5.11)
RDW: 18.5 % — ABNORMAL HIGH (ref 11.5–15.5)
RDW: 18.7 % — ABNORMAL HIGH (ref 11.5–15.5)
WBC: 12 10*3/uL — ABNORMAL HIGH (ref 4.0–10.5)
WBC: 12.7 10*3/uL — ABNORMAL HIGH (ref 4.0–10.5)
nRBC: 0 % (ref 0.0–0.2)
nRBC: 0 % (ref 0.0–0.2)

## 2018-08-04 MED ORDER — PANTOPRAZOLE SODIUM 40 MG PO TBEC
40.0000 mg | DELAYED_RELEASE_TABLET | Freq: Every day | ORAL | Status: DC
  Administered 2018-08-04 – 2018-08-07 (×3): 40 mg via ORAL
  Filled 2018-08-04 (×3): qty 1

## 2018-08-04 NOTE — Telephone Encounter (Signed)
Verbal orders given  

## 2018-08-04 NOTE — Progress Notes (Signed)
PROGRESS NOTE    April Watson  LKG:[id number] DOB: September 08, 1960 DOA: 08/03/2018 PCP: Fayrene Helper, MD    Brief Narrative:  58 year old female with multiple medical problems including congenital hydrocephalus, mental retardation, quadriplegia, seizure disorder, chronic constipation, brought to the hospital due to dark-colored stools.  Stools were found to be heme positive.  She was admitted for further management of GI bleeding.   Assessment & Plan:   Active Problems:   Essential hypertension   Type 2 diabetes mellitus with neurological complications (HCC)   Quadriplegia and quadriparesis (HCC)   Sacral ulcer, limited to breakdown of skin (HCC)   Seizure disorder ([place])   Decubitus ulcer of back, stage 3 (HCC)   Rectal bleeding   Hypokalemia   1. GI bleeding.  Patient admitted with dark-colored stools.  Stools were heme positive.  Hemoglobin has mildly declined since admission.  At this point, family wishes to avoid procedures including EGD if possible.  Plan is to repeat CBC in a.m. and if there is further decline, may need to discuss with GI.  The patient has seen Dr. Gala Romney in the past and colonoscopy was attempted several years ago.  Her sister reports that the patient does not take regular NSAIDs.  Continue on PPI 2. Seizure disorder.  Continue on home dose of Keppra. 3. Hypokalemia.  Replaced 4. Sacral ulcer, present on admission, continue wound VAC therapy 5. Congenital hydrocephalus with mental retardation.   DVT prophylaxis: SCDs Code Status: Full code Family Communication: Discussed with sister and brother Disposition Plan: Return home on discharge   Consultants:     Procedures:     Antimicrobials:       Subjective: Patient is nonverbal.  Her sister is at bedside and provides history.  She was having dark-colored stools prior to admission.  Overnight, she had a rectal tube placed due to persistent stools.  Minimal amount in stool bag at this  point.  Objective: Vitals:   08/03/18 2222 08/03/18 2239 08/04/18 0626 08/04/18 1451  BP: 122/69  135/76 125/72  Pulse: (!) 111  (!) 110 (!) 105  Resp: 17  17 17   Temp: 98.8 F (37.1 C)  98.9 F (37.2 C) 98.4 F (36.9 C)  TempSrc: Oral  Oral Axillary  SpO2: 98%  100% 100%  Weight:  72.5 kg    Height:  5\' 2"  (1.575 m)      Intake/Output Summary (Last 24 hours) at 08/04/2018 2018 Last data filed at 08/04/2018 0700 Gross per 24 hour  Intake 1028.5 ml  Output --  Net 1028.5 ml   Filed Weights   08/03/18 1204 08/03/18 2239  Weight: 77.1 kg 72.5 kg    Examination:  General exam: Appears calm and comfortable  Respiratory system: bilateral rhonchi. Respiratory effort normal. Cardiovascular system: S1 & S2 heard, RRR. No JVD, murmurs, rubs, gallops or clicks. No pedal edema. Gastrointestinal system: Abdomen is distended, soft and nontender. No organomegaly or masses felt. Normal bowel sounds heard. Rectal tube with small amount of dark stools Central nervous system: limited participation in exam. Extremities: contractures in lower extremities Skin: large wound on back with wound vac in place Psychiatry: nonverbal     Data Reviewed: I have personally reviewed following labs and imaging studies  CBC: Recent Labs  Lab 08/03/18 1428 08/03/18 2350 08/04/18 0457  WBC 19.9* 12.7* 12.0*  NEUTROABS 12.7*  --   --   HGB 11.8* 10.0* 9.4*  HCT 38.9 33.4* 31.9*  MCV 96.5 96.8 97.9  PLT 318  372 938   Basic Metabolic Panel: Recent Labs  Lab 08/03/18 1313 08/03/18 1428 08/04/18 0457  NA 144  --  144  K 2.9*  --  3.5  CL 106  --  106  CO2 29  --  28  GLUCOSE 94  --  75  BUN 12  --  12  CREATININE 0.51  --  0.49  CALCIUM 8.5*  --  8.3*  MG  --  1.9  --    GFR: Estimated Creatinine Clearance: 71.5 mL/min (by C-G formula based on SCr of 0.49 mg/dL). Liver Function Tests: Recent Labs  Lab 08/03/18 1313 08/04/18 0457  AST 17 13*  ALT 14 12  ALKPHOS 56 49  BILITOT  0.5 0.9  PROT 6.0* 5.6*  ALBUMIN 2.4* 2.2*   No results for input(s): LIPASE, AMYLASE in the last 168 hours. No results for input(s): AMMONIA in the last 168 hours. Coagulation Profile: No results for input(s): INR, PROTIME in the last 168 hours. Cardiac Enzymes: No results for input(s): CKTOTAL, CKMB, CKMBINDEX, TROPONINI in the last 168 hours. BNP (last 3 results) No results for input(s): PROBNP in the last 8760 hours. HbA1C: No results for input(s): HGBA1C in the last 72 hours. CBG: No results for input(s): GLUCAP in the last 168 hours. Lipid Profile: No results for input(s): CHOL, HDL, LDLCALC, TRIG, CHOLHDL, LDLDIRECT in the last 72 hours. Thyroid Function Tests: Recent Labs    08/03/18 1428  TSH 2.731   Anemia Panel: No results for input(s): VITAMINB12, FOLATE, FERRITIN, TIBC, IRON, RETICCTPCT in the last 72 hours. Sepsis Labs: No results for input(s): PROCALCITON, LATICACIDVEN in the last 168 hours.  Recent Results (from the past 240 hour(s))  SARS Coronavirus 2 (CEPHEID - Performed in [place] hospital lab), Hosp Order     Status: None   Collection Time: 08/03/18  1:33 PM   Specimen: Nasopharyngeal Swab  Result Value Ref Range Status   SARS Coronavirus 2 NEGATIVE NEGATIVE Final    Comment: (NOTE) If result is NEGATIVE SARS-CoV-2 target nucleic acids are NOT DETECTED. The SARS-CoV-2 RNA is generally detectable in upper and lower  respiratory specimens during the acute phase of infection. The lowest  concentration of SARS-CoV-2 viral copies this assay can detect is 250  copies / mL. A negative result does not preclude SARS-CoV-2 infection  and should not be used as the sole basis for treatment or other  patient management decisions.  A negative result may occur with  improper specimen collection / handling, submission of specimen other  than nasopharyngeal swab, presence of viral mutation(s) within the  areas targeted by this assay, and inadequate number of  viral copies  (<250 copies / mL). A negative result must be combined with clinical  observations, patient history, and epidemiological information. If result is POSITIVE SARS-CoV-2 target nucleic acids are DETECTED. The SARS-CoV-2 RNA is generally detectable in upper and lower  respiratory specimens dur ing the acute phase of infection.  Positive  results are indicative of active infection with SARS-CoV-2.  Clinical  correlation with patient history and other diagnostic information is  necessary to determine patient infection status.  Positive results do  not rule out bacterial infection or co-infection with other viruses. If result is PRESUMPTIVE POSTIVE SARS-CoV-2 nucleic acids MAY BE PRESENT.   A presumptive positive result was obtained on the submitted specimen  and confirmed on repeat testing.  While 2019 novel coronavirus  (SARS-CoV-2) nucleic acids may be present in the submitted sample  additional  confirmatory testing may be necessary for epidemiological  and / or clinical management purposes  to differentiate between  SARS-CoV-2 and other Sarbecovirus currently known to infect humans.  If clinically indicated additional testing with an alternate test  methodology [phone]) is advised. The SARS-CoV-2 RNA is generally  detectable in upper and lower respiratory sp ecimens during the acute  phase of infection. The expected result is Negative. Fact Sheet for Patients:  StrictlyIdeas.no Fact Sheet for Healthcare Providers: BankingDealers.co.za This test is not yet approved or cleared by the [place] FDA and has been authorized for detection and/or diagnosis of SARS-CoV-2 by FDA under an Emergency Use Authorization (EUA).  This EUA will remain in effect (meaning this test can be used) for the duration of the COVID-19 declaration under Section 564(b)(1) of the Act, 21 U.S.C. section 360bbb-3(b)(1), unless the authorization is  terminated or revoked sooner. Performed at [facility], [address], [place], [place] [address]          Radiology Studies: Ct Abdomen Pelvis Wo Contrast  Result Date: 08/03/2018 CLINICAL DATA:  Abdominal distension, bloody stools, wound VAC EXAM: CT ABDOMEN AND PELVIS WITHOUT CONTRAST TECHNIQUE: Multidetector CT imaging of the abdomen and pelvis was performed following the standard protocol without IV contrast. COMPARISON:  07/20/2018 FINDINGS: Lower chest: No acute abnormality. Hepatobiliary: No solid liver abnormality is seen. Numerous gallstones. No gallbladder wall thickening, or biliary dilatation. Pancreas: Unremarkable. No pancreatic ductal dilatation or surrounding inflammatory changes. Spleen: Normal in size without significant abnormality. Adrenals/Urinary Tract: Adrenal glands are unremarkable. Kidneys are normal, without renal calculi, solid lesion, or hydronephrosis. Bladder is unremarkable. Stomach/Bowel: Stomach is within normal limits. Normal appendix. 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 Rectal tube in position. There is no obvious evidence of colonic volvulus. Vascular/Lymphatic: No significant vascular findings are present. No enlarged abdominal or pelvic lymph nodes. Reproductive: No mass or other significant abnormality. Other: No abdominal wall hernia or abnormality. No abdominopelvic ascites. Musculoskeletal: Thoracolumbar levoscoliosis and nonweightbearing deformities of the bilateral hips. Diffuse sarcopenia. There has been interval drainage of a large fluid collection about the left back and flank with placement of a wound VAC. IMPRESSION: 1. 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 There is no obvious evidence  of colonic volvulus. Rectal tube in position. 2. There has been interval drainage of a large fluid collection about the left back and flank with placement of a wound VAC. 3.  Other chronic and incidental findings as detailed above. Electronically Signed   By: Eddie Candle M.D.   On: 08/03/2018 15:59        Scheduled Meds:  dextromethorphan-guaiFENesin  1 tablet Oral BID   montelukast  10 mg Oral QHS   nutrition supplement (JUVEN)  1 packet Oral BID BM   pantoprazole  40 mg Oral Daily   PHENobarbital  32.4 mg Oral BID   Ensure Max Protein  11 oz Oral BID   simvastatin  10 mg Oral QHS   [START ON 08/14/2018] Vitamin D (Ergocalciferol)  50,000 Units Oral Q14 Days   Continuous Infusions:  sodium chloride 250 mL (08/03/18 2357)   0.9 % NaCl with KCl 40 mEq / L 100 mL/hr (08/04/18 1421)     LOS: 0 days    Time spent: 4mins    Kathie Dike, MD [facility]  If 7PM-7AM, please contact night-coverage [email or web address]  08/04/2018, 8:18 PM

## 2018-08-04 NOTE — TOC Progression Note (Signed)
Transition of Care [facility]) - Progression Note    Patient Details  Name: April Watson MRN: [id number] Date of Birth: Aug 25, 1960  Transition of Care [facility]) CM/SW Contact  Boneta Lucks, RN Phone Number: 08/04/2018, 11:53 AM  Clinical Narrative:   RN called for CM to talk with sister, [name].  CM went to the room , she was very upset over the OBS letter and did not get any answers last night to understand. She understands now, Is calm and thankful for my time. She lives at home with patient as caregiver, patient is non-verbal and non- ambulatory.  Patient is active with [facility], using wound vac.      Expected Discharge Plan: [place] Barriers to Discharge: Continued Medical Work up  Expected Discharge Plan and Services Expected Discharge Plan: [place] In-house Referral: Clinical Social Work   Post Acute Care Choice: Durable Medical Equipment, Home Health, Resumption of Svcs/PTA Provider Living arrangements for the past 2 months: [place]: [place]             Readmission Risk Interventions Readmission Risk Prevention Plan 07/26/2018 07/25/2018  Transportation Screening Complete Complete  PCP or Specialist Appt within 5-7 Days Complete -  Home Care Screening Complete Complete  Medication Review (RN CM) Complete -  Some recent data might be hidden

## 2018-08-04 NOTE — Telephone Encounter (Signed)
Gina with [facility] health called wanting to confirm if Dr. Moshe Cipro was going to sign off on the home health orders. She can be reached at [phone]

## 2018-08-05 DIAGNOSIS — Z807 Family history of other malignant neoplasms of lymphoid, hematopoietic and related tissues: Secondary | ICD-10-CM | POA: Diagnosis not present

## 2018-08-05 DIAGNOSIS — L89103 Pressure ulcer of unspecified part of back, stage 3: Secondary | ICD-10-CM | POA: Diagnosis not present

## 2018-08-05 DIAGNOSIS — Z800 Family history of malignant neoplasm of digestive organs: Secondary | ICD-10-CM | POA: Diagnosis not present

## 2018-08-05 DIAGNOSIS — I959 Hypotension, unspecified: Secondary | ICD-10-CM | POA: Diagnosis not present

## 2018-08-05 DIAGNOSIS — I10 Essential (primary) hypertension: Secondary | ICD-10-CM | POA: Diagnosis not present

## 2018-08-05 DIAGNOSIS — Z1159 Encounter for screening for other viral diseases: Secondary | ICD-10-CM | POA: Diagnosis not present

## 2018-08-05 DIAGNOSIS — L98421 Non-pressure chronic ulcer of back limited to breakdown of skin: Secondary | ICD-10-CM | POA: Diagnosis not present

## 2018-08-05 DIAGNOSIS — K921 Melena: Principal | ICD-10-CM

## 2018-08-05 DIAGNOSIS — Z8249 Family history of ischemic heart disease and other diseases of the circulatory system: Secondary | ICD-10-CM | POA: Diagnosis not present

## 2018-08-05 DIAGNOSIS — F79 Unspecified intellectual disabilities: Secondary | ICD-10-CM | POA: Diagnosis present

## 2018-08-05 DIAGNOSIS — D649 Anemia, unspecified: Secondary | ICD-10-CM | POA: Diagnosis not present

## 2018-08-05 DIAGNOSIS — Z8349 Family history of other endocrine, nutritional and metabolic diseases: Secondary | ICD-10-CM | POA: Diagnosis not present

## 2018-08-05 DIAGNOSIS — E785 Hyperlipidemia, unspecified: Secondary | ICD-10-CM | POA: Diagnosis present

## 2018-08-05 DIAGNOSIS — K625 Hemorrhage of anus and rectum: Secondary | ICD-10-CM | POA: Diagnosis not present

## 2018-08-05 DIAGNOSIS — G8250 Quadriplegia, unspecified: Secondary | ICD-10-CM | POA: Diagnosis not present

## 2018-08-05 DIAGNOSIS — K317 Polyp of stomach and duodenum: Secondary | ICD-10-CM | POA: Diagnosis not present

## 2018-08-05 DIAGNOSIS — Z8042 Family history of malignant neoplasm of prostate: Secondary | ICD-10-CM | POA: Diagnosis not present

## 2018-08-05 DIAGNOSIS — K449 Diaphragmatic hernia without obstruction or gangrene: Secondary | ICD-10-CM | POA: Diagnosis not present

## 2018-08-05 DIAGNOSIS — E1149 Type 2 diabetes mellitus with other diabetic neurological complication: Secondary | ICD-10-CM | POA: Diagnosis present

## 2018-08-05 DIAGNOSIS — K598 Other specified functional intestinal disorders: Secondary | ICD-10-CM | POA: Diagnosis not present

## 2018-08-05 DIAGNOSIS — Z83511 Family history of glaucoma: Secondary | ICD-10-CM | POA: Diagnosis not present

## 2018-08-05 DIAGNOSIS — E876 Hypokalemia: Secondary | ICD-10-CM | POA: Diagnosis not present

## 2018-08-05 DIAGNOSIS — G40909 Epilepsy, unspecified, not intractable, without status epilepticus: Secondary | ICD-10-CM | POA: Diagnosis not present

## 2018-08-05 DIAGNOSIS — Z841 Family history of disorders of kidney and ureter: Secondary | ICD-10-CM | POA: Diagnosis not present

## 2018-08-05 DIAGNOSIS — K219 Gastro-esophageal reflux disease without esophagitis: Secondary | ICD-10-CM | POA: Diagnosis present

## 2018-08-05 DIAGNOSIS — Z7401 Bed confinement status: Secondary | ICD-10-CM | POA: Diagnosis not present

## 2018-08-05 DIAGNOSIS — L89152 Pressure ulcer of sacral region, stage 2: Secondary | ICD-10-CM | POA: Diagnosis present

## 2018-08-05 DIAGNOSIS — K228 Other specified diseases of esophagus: Secondary | ICD-10-CM | POA: Diagnosis not present

## 2018-08-05 DIAGNOSIS — K5909 Other constipation: Secondary | ICD-10-CM | POA: Diagnosis present

## 2018-08-05 LAB — CBC
HCT: 28.8 % — ABNORMAL LOW (ref 36.0–46.0)
Hemoglobin: 8.3 g/dL — ABNORMAL LOW (ref 12.0–15.0)
MCH: 28.4 pg (ref 26.0–34.0)
MCHC: 28.8 g/dL — ABNORMAL LOW (ref 30.0–36.0)
MCV: 98.6 fL (ref 80.0–100.0)
Platelets: 279 10*3/uL (ref 150–400)
RBC: 2.92 MIL/uL — ABNORMAL LOW (ref 3.87–5.11)
RDW: 18.6 % — ABNORMAL HIGH (ref 11.5–15.5)
WBC: 10.6 10*3/uL — ABNORMAL HIGH (ref 4.0–10.5)
nRBC: 0 % (ref 0.0–0.2)

## 2018-08-05 LAB — COMPREHENSIVE METABOLIC PANEL
ALT: 11 U/L (ref 0–44)
AST: 12 U/L — ABNORMAL LOW (ref 15–41)
Albumin: 1.9 g/dL — ABNORMAL LOW (ref 3.5–5.0)
Alkaline Phosphatase: 39 U/L (ref 38–126)
Anion gap: 5 (ref 5–15)
BUN: 21 mg/dL — ABNORMAL HIGH (ref 6–20)
CO2: 29 mmol/L (ref 22–32)
Calcium: 8 mg/dL — ABNORMAL LOW (ref 8.9–10.3)
Chloride: 108 mmol/L (ref 98–111)
Creatinine, Ser: 0.35 mg/dL — ABNORMAL LOW (ref 0.44–1.00)
GFR calc Af Amer: >60 mL/min (ref >60)
GFR calc non Af Amer: >60 mL/min (ref >60)
Glucose, Bld: 91 mg/dL (ref 70–99)
Potassium: 3.9 mmol/L (ref 3.5–5.1)
Sodium: 142 mmol/L (ref 135–145)
Total Bilirubin: 0.4 mg/dL (ref 0.3–1.2)
Total Protein: 4.9 g/dL — ABNORMAL LOW (ref 6.5–8.1)

## 2018-08-05 LAB — GLUCOSE, CAPILLARY: Glucose-Capillary: 131 mg/dL — ABNORMAL HIGH (ref 70–99)

## 2018-08-05 LAB — HIV ANTIBODY (ROUTINE TESTING W REFLEX): HIV Screen 4th Generation wRfx: NONREACTIVE

## 2018-08-05 NOTE — Consult Note (Signed)
Referring Provider: [facility] Primary Care Physician:  Fayrene Helper, MD Primary Gastroenterologist:  Dr. Gala Romney  Date of Admission: 08/03/2018 Date of Consultation: 08/05/2018  Reason for Consultation:  GI bleed, anemia  HPI:  April Watson is a 58 y.o. 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  She was brought to the emergency department due to dark-colored stools that were found to be heme positive.  Initial hemoglobin in the ER was 11.8.  Family was wishing to avoid procedures including EGD if possible.  Recommended follow CBC and consult with GI for any further decline in hemoglobin.  No NSAIDs per the family.  Continues on a PPI.  This morning her hemoglobin further declined from 9.4-8.3.  GI was consulted.  Baseline hemoglobin appears to be in the 10 range.  Her anemia is normocytic and normochromic.  Platelet count normal.  Previously attempted colonoscopy dated 06/12/2011 was an incomplete colonoscopy due to poor prep.  The procedure was subsequently aborted.  Recommended follow-up in 4 weeks with an attempt at air-contrast barium enema to image remainder of colon.  Family wished to recoup and would call for further scheduling.  Patient was subsequently lost to follow-up.  Today she is accompanied by her sister. The patient is essentially non-verbal but smiling in the bed. Per her sister, she began having black stools a couple days ago. No hematochezia. Denies signs of abdominal pain, N/V, GERD. No other GI complaints other than black stools. States her brother was hesitant to proceed with EGD but now that hgb has declined further, they are all wanting her to be evaluated endoscopically. Typically at home she has a good quality of life, is interactive with family, and goes on shopping trips and to church (before  COVID-19).  Past Medical History:  Diagnosis Date  . 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 Chronic constipation   . Congenital hydrocephalus ([place])   . GERD (gastroesophageal reflux disease)   . Hyperlipidemia   . Hypertension   . Mental retardation   . Seizure disorder ([place])   . Seizures ([place])     Past Surgical History:  Procedure Laterality Date  . COLONOSCOPY  02/08/06   Rourk-mild anal stenosis, multiple erosions o fprox rectum and sigmoid. Long redundant colon. Bx  ?ischemic colitis  . COLONOSCOPY  06/12/2011   Procedure: COLONOSCOPY;  Surgeon: Daneil Dolin, MD;  Location: [facility];  Service: Endoscopy;  Laterality: N/A;  11:00  . FLEXIBLE SIGMOIDOSCOPY  08/14/2003   Rourk- Normal-appearing rectum, normal-appearing sigmoid colon to 50  cm.  Relatively poor prep made exam more difficult  . patient reports that they had brain surgery attempted for hydrocephalus in infancy. However , the procedure was aborted because she coded. .      Prior to Admission medications   Medication Sig Start Date End Date Taking? Authorizing Provider  benzonatate (TESSALON) 100 MG capsule Take by mouth 2 (two) times daily as needed for cough.   Yes [provider]  dextromethorphan-guaiFENesin (MUCINEX DM) 30-600 MG 12hr tablet Take 1 tablet by mouth 2 (two) times daily. 07/27/18 08/26/18 Yes Shah, Pratik D, DO  docusate sodium (STOOL SOFTENER) 100 MG capsule TAKE 1 CAPSULE TWICE DAILY FOR CONSTIPATION. Patient taking differently: Take 100 mg by mouth 2 (two) times daily.  06/19/14  Yes Fayrene Helper, MD  Ensure Max Protein (  ENSURE MAX PROTEIN) LIQD Take 330 mLs (11 oz total) by mouth 2 (two) times daily. 07/27/18 08/26/18 Yes Shah, Pratik D, DO  montelukast (SINGULAIR) 10 MG tablet Take 1 tablet (10 mg total) by mouth at bedtime. 04/06/18  Yes Fayrene Helper, MD  pantoprazole (PROTONIX) 20 MG tablet Take 1 tablet (20 mg total) by mouth daily. 05/24/18  Yes Fayrene Helper, MD  PHENobarbital (LUMINAL) 32.4 MG tablet TAKE ONE TABLET BY MOUTH TWICE DAILY FOR SEIZURE DISORDER. Patient taking differently: Take 32.4 mg by mouth 2 (two) times daily. FOR SEIZURE DISORDER. 03/30/18  Yes Fayrene Helper, MD  polyethylene glycol powder (GLYCOLAX/MIRALAX) powder MIX 1 CAPFUL (17 G) IN 8 OUNCES OF WATER/JUICE AND DRINK ONCE DAILY FOR CONSTIPATION. Patient taking differently: Take 17 g by mouth See admin instructions. MIX 1 CAPFUL (17 G) IN 8 OUNCES OF WATER/JUICE AND DRINK ONCE DAILY FOR CONSTIPATION. 11/11/16  Yes Fayrene Helper, MD  potassium chloride (K-DUR) 10 MEQ tablet TAKE (1) TABLET BY MOUTH TWICE DAILY WITH FOOD. Patient taking differently: Take 10 mEq by mouth 2 (two) times daily with a meal.  07/20/18  Yes Fayrene Helper, MD  simvastatin (ZOCOR) 10 MG tablet TAKE (1) TABLET BY MOUTH AT BEDTIME FOR CHOLESTEROL. Patient taking differently: Take 10 mg by mouth at bedtime.  04/28/18  Yes Fayrene Helper, MD  Vitamin D, Ergocalciferol, (DRISDOL) 1.25 MG (50000 UT) CAPS capsule TAKE 1 CAPSULE BY MOUTH EVERY 2 WEEKS. Patient taking differently: Take 50,000 Units by mouth every 14 (fourteen) days.  04/28/18  Yes Fayrene Helper, MD  Amino Acids (ARGIMENT) PACK Mix one packet with 6 oz juice or water twice daily Patient not taking: Reported on 08/03/2018 08/03/18   Fayrene Helper, MD  nutrition supplement, JUVEN, (JUVEN) PACK Take 1 packet by mouth 2 (two) times daily between meals. Patient not taking: Reported on 08/03/2018 07/27/18 08/26/18  Heath Lark D, DO    Current Facility-Administered Medications  Medication Dose Route Frequency Provider Last Rate Last Dose  . 0.9 %  sodium chloride infusion   Intravenous PRN Darliss Cheney, MD 10 mL/hr at 08/03/18 2357 250 mL at 08/03/18 2357  . 0.9 % NaCl with KCl 40 mEq / L  infusion   Intravenous Continuous Darliss Cheney, MD 100 mL/hr at 08/05/18 0210 100 mL/hr at 08/05/18 0210  . acetaminophen (TYLENOL)  tablet 650 mg  650 mg Oral Q6H PRN Darliss Cheney, MD       Or  . acetaminophen (TYLENOL) suppository 650 mg  650 mg Rectal Q6H PRN Darliss Cheney, MD      . benzonatate (TESSALON) capsule 100 mg  100 mg Oral BID PRN Darliss Cheney, MD      . dextromethorphan-guaiFENesin (MUCINEX DM) 30-600 MG per 12 hr tablet 1 tablet  1 tablet Oral BID Darliss Cheney, MD   1 tablet at 08/04/18 2207  . montelukast (SINGULAIR) tablet 10 mg  10 mg Oral QHS Darliss Cheney, MD   10 mg at 08/04/18 2207  . nutrition supplement (JUVEN) (JUVEN) powder packet 1 packet  1 packet Oral BID BM Darliss Cheney, MD   1 packet at 08/04/18 1421  . ondansetron (ZOFRAN) tablet 4 mg  4 mg Oral Q6H PRN Darliss Cheney, MD       Or  . ondansetron (ZOFRAN) injection 4 mg  4 mg Intravenous Q6H PRN Pahwani, Ravi, MD      . pantoprazole (PROTONIX) EC tablet 40 mg  40 mg Oral Daily  Memon, Jehanzeb, MD   40 mg at 08/04/18 0911  . PHENobarbital (LUMINAL) tablet 32.4 mg  32.4 mg Oral BID Pahwani, Ravi, MD   32.4 mg at 08/04/18 2207  . protein supplement (ENSURE MAX) liquid  11 oz Oral BID Pahwani, Ravi, MD   11 oz at 08/04/18 2207  . simvastatin (ZOCOR) tablet 10 mg  10 mg Oral QHS Pahwani, Ravi, MD   10 mg at 08/04/18 2207  . [START ON 08/14/2018] Vitamin D (Ergocalciferol) (DRISDOL) capsule 50,000 Units  50,000 Units Oral Q14 Days Pahwani, Ravi, MD        Allergies as of 08/03/2018  . (No Known Allergies)    Family History  Problem Relation Age of Onset  . Kidney failure Mother        on dialysis  . Hypertension Mother   . Kidney disease Mother   . Prostate cancer Father   . Hypertension Father   . Glaucoma Father   . Colon cancer Sister 50  . Multiple myeloma Sister   . Hypertension Sister   . Glaucoma Sister   . Allergies Sister   . Hypertension Sister   . Glaucoma Sister   . Hyperlipidemia Sister   . Hypertension Brother   . Allergies Brother   . Hyperlipidemia Brother   . Colon cancer Other        family history   .  Lymphoma Other        family history     Social History   Socioeconomic History  . Marital status: Single    Spouse name: Not on file  . Number of children: 0  . Years of education: Not on file  . Highest education level: Not on file  Occupational History  . Occupation: disabled   Social Needs  . Financial resource strain: Not hard at all  . Food insecurity    Worry: Patient refused    Inability: Patient refused  . Transportation needs    Medical: Patient refused    Non-medical: Patient refused  Tobacco Use  . Smoking status: Never Smoker  . Smokeless tobacco: Never Used  Substance and Sexual Activity  . Alcohol use: No  . Drug use: No  . Sexual activity: Never  Lifestyle  . Physical activity    Days per week: Patient refused    Minutes per session: Patient refused  . Stress: Not at all  Relationships  . Social connections    Talks on phone: Patient refused    Gets together: Patient refused    Attends religious service: Patient refused    Active member of club or organization: Patient refused    Attends meetings of clubs or organizations: Patient refused    Relationship status: Patient refused  . Intimate partner violence    Fear of current or ex partner: Patient refused    Emotionally abused: Patient refused    Physically abused: Patient refused    Forced sexual activity: Patient refused  Other Topics Concern  . Not on file  Social History Narrative   Sisters assist w/ pt care    Review of Systems:  SEVERELY LIMITED DUE TO NON-VERBAL; assisted by sister present at bedside. General: Negative for anorexia, weight loss, fever, chills. Eyes: Negative for vision changes.  ENT: Negative difficulty swallowing. CV: Negative for chest pain, peripheral edema.  Respiratory: Negative for dyspnea at rest, cough, sputum.  GI: See history of present illness. Endo: Negative for unusual weight change.  Heme: Negative for bruising or bleeding. Allergy:   Negative for rash  or hives.  Physical Exam: Vital signs in last 24 hours: Temp:  [98.4 F (36.9 C)-98.9 F (37.2 C)] 98.6 F (37 C) (07/17 0610) Pulse Rate:  [91-113] 91 (07/17 0610) Resp:  [16-17] 16 (07/17 0610) BP: (125-138)/(72-78) 128/78 (07/17 0610) SpO2:  [97 %-100 %] 98 % (07/16 2256) Weight:  [81.4 kg] 81.4 kg (07/17 0610) Last BM Date: 08/04/18 General:   Alert,  Well-developed, well-nourished, pleasant and cooperative in NAD Head:  Normocephalic and atraumatic. Eyes:  Sclera clear, no icterus. Conjunctiva pink. Lungs:  Clear throughout to auscultation. No wheezes, crackles, or rhonchi. No acute distress. Heart:  Regular rate and rhythm; no murmurs, clicks, rubs, or gallops. Abdomen:  Soft, nontender and nondistended. No masses, hepatosplenomegaly or hernias noted. Normal bowel sounds, without guarding, and without rebound.   Rectal:  Deferred.   Msk:  Symmetrical without gross deformities. Extremities:  Without clubbing or edema. Neurologic:  Alert and  oriented x4;  grossly normal neurologically. Psych:  Alert but non-communicative. Apparent happy mood and affect.  Intake/Output from previous day: 07/16 0701 - 07/17 0700 In: 240 [P.O.:240] Out: -  Intake/Output this shift: No intake/output data recorded.  Lab Results: Recent Labs    08/03/18 2350 08/04/18 0457 08/05/18 0504  WBC 12.7* 12.0* 10.6*  HGB 10.0* 9.4* 8.3*  HCT 33.4* 31.9* 28.8*  PLT 372 354 279   BMET Recent Labs    08/03/18 1313 08/04/18 0457 08/05/18 0504  NA 144 144 142  K 2.9* 3.5 3.9  CL 106 106 108  CO2 _0 GLUCOSE 94 75 91  BUN 12 12 21*  CREATININE 0.51 0.49 0.35*  CALCIUM 8.5* 8.3* 8.0*   LFT Recent Labs    08/03/18 1313 08/04/18 0457 08/05/18 0504  PROT 6.0* 5.6* 4.9*  ALBUMIN 2.4* 2.2* 1.9*  AST 17 13* 12*  ALT _1 ALKPHOS 56 49 39  BILITOT 0.5 0.9 0.4   PT/INR No results for input(s): LABPROT, INR in the last 72 hours. Hepatitis Panel No results for input(s):  HEPBSAG, HCVAB, HEPAIGM, HEPBIGM in the last 72 hours. C-Diff No results for input(s): CDIFFTOX in the last 72 hours.  Studies/Results: Ct Abdomen Pelvis Wo Contrast  Result Date: 08/03/2018 CLINICAL DATA:  Abdominal distension, bloody stools, wound VAC EXAM: CT ABDOMEN AND PELVIS WITHOUT CONTRAST TECHNIQUE: Multidetector CT imaging of the abdomen and pelvis was performed following the standard protocol without IV contrast. COMPARISON:  07/20/2018 FINDINGS: Lower chest: No acute abnormality. Hepatobiliary: No solid liver abnormality is seen. Numerous gallstones. No gallbladder wall thickening, or biliary dilatation. Pancreas: Unremarkable. No pancreatic ductal dilatation or surrounding inflammatory changes. Spleen: Normal in size without significant abnormality. Adrenals/Urinary Tract: Adrenal glands are unremarkable. Kidneys are normal, without renal calculi, solid lesion, or hydronephrosis. Bladder is unremarkable. Stomach/Bowel: Stomach is within normal limits. Normal appendix. 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 Rectal tube in position. There is no obvious evidence of colonic volvulus. Vascular/Lymphatic: No significant vascular findings are present. No enlarged abdominal or pelvic lymph nodes. Reproductive: No mass or other significant abnormality. Other: No abdominal wall hernia or abnormality. No abdominopelvic ascites. Musculoskeletal: Thoracolumbar levoscoliosis and nonweightbearing deformities of the bilateral hips. Diffuse sarcopenia. There has been interval drainage of a large fluid collection about the left back and flank with placement of a wound VAC. IMPRESSION: 1. The colon is extremely redundant and gas distended,  particularly the sigmoid, this appearance similar to prior examination and likely related to chronic pseudo-obstruction. There is no obvious  evidence of colonic volvulus. Rectal tube in position. 2. There has been interval drainage of a large fluid collection about the left back and flank with placement of a wound VAC. 3.  Other chronic and incidental findings as detailed above. Electronically Signed   By: Eddie Candle M.D.   On: 08/03/2018 15:59    Impression: Pleasant non-verbal 58 year old female admitted for complaints of black stools.  She was noted to be anemic initially near her baseline at 10.  However, this continued to decline to 8.3.  We are consulted for consideration of upper endoscopy.  The patient is had multiple black stools at home before coming to the emergency department.  She is cared for by her sister.  She normally has a good quality of life, is interactive, participates in shopping and church activities.  Discussed the situation with her sister and brother and, although initially hesitant, they are now requesting endoscopic evaluation to identify and treat any potential causes of GI bleed.  The patient generally looks well today.  Her sister is accompanying her and is at the bedside.  Plan: 1. Monitor hemoglobin 2. Monitor for any further active GI bleed 3. Transfuse as necessary 4. We will plan for an upper endoscopy on propofol/MAC to further evaluate.  This will likely need to be completed this weekend due to regular diet and pudding today 5. Supportive measures   Thank you for allowing [place] to participate in the care of Tse Bonito, DNP, AGNP-C Adult & Gerontological Nurse Practitioner [facility]   LOS: 0 days     08/05/2018, 9:12 AM

## 2018-08-05 NOTE — Care Management Important Message (Signed)
Important Message  Patient Details  Name: [name] MRN: [id number] Date of Birth: 1960-11-24   Medicare Important Message Given:  Yes     Tommy Medal 08/05/2018, 12:17 PM

## 2018-08-05 NOTE — Progress Notes (Signed)
PROGRESS NOTE    April Watson  JGG:[id number] DOB: 06-03-1960 DOA: 08/03/2018 PCP: Fayrene Helper, MD    Brief Narrative:  58 year old female with multiple medical problems including congenital hydrocephalus, mental retardation, quadriplegia, seizure disorder, chronic constipation, brought to the hospital due to dark-colored stools.  Stools were found to be heme positive.  She was admitted for further management of GI bleeding.   Assessment & Plan:   Active Problems:   Essential hypertension   Type 2 diabetes mellitus with neurological complications (HCC)   Quadriplegia and quadriparesis (HCC)   Sacral ulcer, limited to breakdown of skin (HCC)   Seizure disorder (HCC)   Decubitus ulcer of back, stage 3 (HCC)   Rectal bleeding   Hypokalemia   Rectal bleed   Melena   Anemia   1. GI bleeding.  Patient admitted with dark-colored stools.  Stools were heme positive.  Hemoglobin has declined since admission.  Seen by GI and plans for EGD in a.m. continue on PPI 2. Seizure disorder.  Continue on home dose of Keppra. 3. Hypokalemia.  Replaced 4. Sacral ulcer, present on admission, continue wound VAC therapy 5. Congenital hydrocephalus with mental retardation.   DVT prophylaxis: SCDs Code Status: Full code Family Communication: Discussed with sister at the bedside Disposition Plan: Return home on discharge, possibly tomorrow after procedure   Consultants:   Gastroenterology  Procedures:     Antimicrobials:       Subjective: Has not had any new complaints.  No significant output in terms of stool.  No vomiting.  Objective: Vitals:   08/04/18 1953 08/04/18 2256 08/05/18 0610 08/05/18 1550  BP:  138/76 128/78 (!) 146/85  Pulse:  (!) 113 91 (!) 107  Resp:  17 16 18   Temp:  98.9 F (37.2 C) 98.6 F (37 C)   TempSrc:  Oral    SpO2: 97% 98%  100%  Weight:   81.4 kg   Height:        Intake/Output Summary (Last 24 hours) at 08/05/2018 1857 Last data filed at  08/05/2018 1054 Gross per 24 hour  Intake 240 ml  Output -  Net 240 ml   Filed Weights   08/03/18 1204 08/03/18 2239 08/05/18 0610  Weight: 77.1 kg 72.5 kg 81.4 kg    Examination:  General exam: Alert, awake, no distress Respiratory system: Occasional rhonchi. Respiratory effort normal. Cardiovascular system:RRR. No murmurs, rubs, gallops. Gastrointestinal system: Abdomen is nondistended, soft and nontender. No organomegaly or masses felt. Normal bowel sounds heard. Central nervous system: Limited exam due to mental status Extremities: Bilateral lower extremity contractures Skin: Wound on back with wound VAC in place Psychiatry: Nonverbal     Data Reviewed: I have personally reviewed following labs and imaging studies  CBC: Recent Labs  Lab 08/03/18 1428 08/03/18 2350 08/04/18 0457 08/05/18 0504  WBC 19.9* 12.7* 12.0* 10.6*  NEUTROABS 12.7*  --   --   --   HGB 11.8* 10.0* 9.4* 8.3*  HCT 38.9 33.4* 31.9* 28.8*  MCV 96.5 96.8 97.9 98.6  PLT 318 372 354 546   Basic Metabolic Panel: Recent Labs  Lab 08/03/18 1313 08/03/18 1428 08/04/18 0457 08/05/18 0504  NA 144  --  144 142  K 2.9*  --  3.5 3.9  CL 106  --  106 108  CO2 29  --  28 29  GLUCOSE 94  --  75 91  BUN 12  --  12 21*  CREATININE 0.51  --  0.49 0.35*  CALCIUM 8.5*  --  8.3* 8.0*  MG  --  1.9  --   --    GFR: Estimated Creatinine Clearance: 75.8 mL/min (A) (by C-G formula based on SCr of 0.35 mg/dL (L)). Liver Function Tests: Recent Labs  Lab 08/03/18 1313 08/04/18 0457 08/05/18 0504  AST 17 13* 12*  ALT 14 12 11   ALKPHOS 56 49 39  BILITOT 0.5 0.9 0.4  PROT 6.0* 5.6* 4.9*  ALBUMIN 2.4* 2.2* 1.9*   No results for input(s): LIPASE, AMYLASE in the last 168 hours. No results for input(s): AMMONIA in the last 168 hours. Coagulation Profile: No results for input(s): INR, PROTIME in the last 168 hours. Cardiac Enzymes: No results for input(s): CKTOTAL, CKMB, CKMBINDEX, TROPONINI in the last  168 hours. BNP (last 3 results) No results for input(s): PROBNP in the last 8760 hours. HbA1C: No results for input(s): HGBA1C in the last 72 hours. CBG: No results for input(s): GLUCAP in the last 168 hours. Lipid Profile: No results for input(s): CHOL, HDL, LDLCALC, TRIG, CHOLHDL, LDLDIRECT in the last 72 hours. Thyroid Function Tests: Recent Labs    08/03/18 1428  TSH 2.731   Anemia Panel: No results for input(s): VITAMINB12, FOLATE, FERRITIN, TIBC, IRON, RETICCTPCT in the last 72 hours. Sepsis Labs: No results for input(s): PROCALCITON, LATICACIDVEN in the last 168 hours.  Recent Results (from the past 240 hour(s))  SARS Coronavirus 2 (CEPHEID - Performed in [place] hospital lab), Hosp Order     Status: None   Collection Time: 08/03/18  1:33 PM   Specimen: Nasopharyngeal Swab  Result Value Ref Range Status   SARS Coronavirus 2 NEGATIVE NEGATIVE Final    Comment: (NOTE) If result is NEGATIVE SARS-CoV-2 target nucleic acids are NOT DETECTED. The SARS-CoV-2 RNA is generally detectable in upper and lower  respiratory specimens during the acute phase of infection. The lowest  concentration of SARS-CoV-2 viral copies this assay can detect is 250  copies / mL. A negative result does not preclude SARS-CoV-2 infection  and should not be used as the sole basis for treatment or other  patient management decisions.  A negative result may occur with  improper specimen collection / handling, submission of specimen other  than nasopharyngeal swab, presence of viral mutation(s) within the  areas targeted by this assay, and inadequate number of viral copies  (<250 copies / mL). A negative result must be combined with clinical  observations, patient history, and epidemiological information. If result is POSITIVE SARS-CoV-2 target nucleic acids are DETECTED. The SARS-CoV-2 RNA is generally detectable in upper and lower  respiratory specimens dur ing the acute phase of infection.   Positive  results are indicative of active infection with SARS-CoV-2.  Clinical  correlation with patient history and other diagnostic information is  necessary to determine patient infection status.  Positive results do  not rule out bacterial infection or co-infection with other viruses. If result is PRESUMPTIVE POSTIVE SARS-CoV-2 nucleic acids MAY BE PRESENT.   A presumptive positive result was obtained on the submitted specimen  and confirmed on repeat testing.  While 2019 novel coronavirus  (SARS-CoV-2) nucleic acids may be present in the submitted sample  additional confirmatory testing may be necessary for epidemiological  and / or clinical management purposes  to differentiate between  SARS-CoV-2 and other Sarbecovirus currently known to infect humans.  If clinically indicated additional testing with an alternate test  methodology [phone]) is advised. The SARS-CoV-2 RNA is generally  detectable in upper and  lower respiratory sp ecimens during the acute  phase of infection. The expected result is Negative. Fact Sheet for Patients:  StrictlyIdeas.no Fact Sheet for Healthcare Providers: BankingDealers.co.za This test is not yet approved or cleared by the [place] FDA and has been authorized for detection and/or diagnosis of SARS-CoV-2 by FDA under an Emergency Use Authorization (EUA).  This EUA will remain in effect (meaning this test can be used) for the duration of the COVID-19 declaration under Section 564(b)(1) of the Act, 21 U.S.C. section 360bbb-3(b)(1), unless the authorization is terminated or revoked sooner. Performed at [facility], [address], [place], [place] [address]          Radiology Studies: No results found.      Scheduled Meds: . dextromethorphan-guaiFENesin  1 tablet Oral BID  . montelukast  10 mg Oral QHS  . nutrition supplement (JUVEN)  1 packet Oral BID BM  . pantoprazole  40 mg Oral  Daily  . PHENobarbital  32.4 mg Oral BID  . Ensure Max Protein  11 oz Oral BID  . simvastatin  10 mg Oral QHS  . [START ON 08/14/2018] Vitamin D (Ergocalciferol)  50,000 Units Oral Q14 Days   Continuous Infusions: . sodium chloride 250 mL (08/03/18 2357)     LOS: 0 days    Time spent: 72mins    Kathie Dike, MD [facility]   If 7PM-7AM, please contact night-coverage [email or web address]  08/05/2018, 6:57 PM

## 2018-08-06 ENCOUNTER — Encounter (HOSPITAL_COMMUNITY)
Admission: EM | Disposition: A | Discharge status: Home Health | Payer: Self-pay | Source: Home / Self Care | Attending: Internal Medicine

## 2018-08-06 ENCOUNTER — Inpatient Hospital Stay (HOSPITAL_COMMUNITY): Payer: Medicare Other | Admitting: Anesthesiology

## 2018-08-06 ENCOUNTER — Other Ambulatory Visit: Payer: Self-pay

## 2018-08-06 ENCOUNTER — Encounter (HOSPITAL_COMMUNITY): Payer: Self-pay

## 2018-08-06 DIAGNOSIS — K228 Other specified diseases of esophagus: Secondary | ICD-10-CM

## 2018-08-06 DIAGNOSIS — K921 Melena: Secondary | ICD-10-CM

## 2018-08-06 DIAGNOSIS — K449 Diaphragmatic hernia without obstruction or gangrene: Secondary | ICD-10-CM

## 2018-08-06 DIAGNOSIS — K317 Polyp of stomach and duodenum: Secondary | ICD-10-CM

## 2018-08-06 HISTORY — PX: ESOPHAGOGASTRODUODENOSCOPY (EGD) WITH PROPOFOL: SHX5813

## 2018-08-06 LAB — CBC
HCT: 30.9 % — ABNORMAL LOW (ref 36.0–46.0)
Hemoglobin: 9.2 g/dL — ABNORMAL LOW (ref 12.0–15.0)
MCH: 29.3 pg (ref 26.0–34.0)
MCHC: 29.8 g/dL — ABNORMAL LOW (ref 30.0–36.0)
MCV: 98.4 fL (ref 80.0–100.0)
Platelets: 373 10*3/uL (ref 150–400)
RBC: 3.14 MIL/uL — ABNORMAL LOW (ref 3.87–5.11)
RDW: 18.4 % — ABNORMAL HIGH (ref 11.5–15.5)
WBC: 14.3 10*3/uL — ABNORMAL HIGH (ref 4.0–10.5)
nRBC: 0 % (ref 0.0–0.2)

## 2018-08-06 SURGERY — ESOPHAGOGASTRODUODENOSCOPY (EGD) WITH PROPOFOL
Anesthesia: General

## 2018-08-06 MED ORDER — PROPOFOL 10 MG/ML IV BOLUS
INTRAVENOUS | Status: DC | PRN
  Administered 2018-08-06: 50 mg via INTRAVENOUS

## 2018-08-06 MED ORDER — PROPOFOL 10 MG/ML IV BOLUS
INTRAVENOUS | Status: AC
  Filled 2018-08-06: qty 20

## 2018-08-06 MED ORDER — LINACLOTIDE 145 MCG PO CAPS
145.0000 ug | ORAL_CAPSULE | Freq: Every day | ORAL | Status: DC
  Administered 2018-08-07: 145 ug via ORAL
  Filled 2018-08-06: qty 1

## 2018-08-06 MED ORDER — SENNOSIDES-DOCUSATE SODIUM 8.6-50 MG PO TABS
1.0000 | ORAL_TABLET | Freq: Two times a day (BID) | ORAL | Status: DC
  Administered 2018-08-06 – 2018-08-07 (×3): 1 via ORAL
  Filled 2018-08-06 (×3): qty 1

## 2018-08-06 MED ORDER — SODIUM CHLORIDE 0.9 % IV SOLN: INTRAVENOUS | Status: DC

## 2018-08-06 MED ORDER — STERILE WATER FOR IRRIGATION IR SOLN
Status: DC | PRN
  Administered 2018-08-06: 1.5 mL

## 2018-08-06 MED ORDER — POLYETHYLENE GLYCOL 3350 17 G PO PACK
17.0000 g | PACK | Freq: Two times a day (BID) | ORAL | Status: DC
  Administered 2018-08-06 – 2018-08-07 (×3): 17 g via ORAL
  Filled 2018-08-06 (×3): qty 1

## 2018-08-06 NOTE — Progress Notes (Signed)
Brief EGD note.  Normal mucosa of the esophagus. Normal GE junction. 4 cm sliding hiatal hernia. Dilated stomach no ulcer or bleeding lesions noted. Petechiae at distal bulb no ulcer or AV malformations noted. Normal mucosa of second part of the duodenum.

## 2018-08-06 NOTE — Transfer of Care (Signed)
Immediate Anesthesia Transfer of Care Note  Patient: April Watson  Procedure(s) Performed: ESOPHAGOGASTRODUODENOSCOPY (EGD) WITH PROPOFOL (N/A )  Patient Location: PACU  Anesthesia Type:General  Level of Consciousness: drowsy  Airway & Oxygen Therapy: Patient Spontanous Breathing  Post-op Assessment: Report given to RN  Post vital signs: Reviewed  Last Vitals:  Vitals Value Taken Time  BP    Temp    Pulse    Resp    SpO2      Last Pain:  Vitals:   08/06/18 0639  TempSrc: Oral         Complications: No apparent anesthesia complications

## 2018-08-06 NOTE — Progress Notes (Signed)
  Subjective:  Patient is unable to provide any history.  History obtained from her April Watson who is at bedside.  Patient has not experienced nausea vomiting melena or rectal bleeding during the night.  Her sister has noted swelling to her hands and feet.  Objective: Blood pressure 127/79, pulse (!) 115, temperature 98.8 F (37.1 C), temperature source Oral, resp. rate 17, height 5\' 2"  (1.575 m), weight 81.4 kg, SpO2 98 %. Patient is alert and has eye contact but she does not respond verbally to any questions. She does smile. She does seem to follow vocal commands.  Her sister asked her to take a deep breath and she did. Cardiac exam with regular rhythm normal S1 and S2.  No murmur or gallop noted. Auscultation of lungs reveals vesicular breath sounds anteriorly. Abdomen is full but soft and nontender with organomegaly or masses. She has edema to both forearms hands as well as feet. He has contractures to both legs.  Labs/studies Results:  CBC Latest Ref Rng & Units 08/06/2018 08/05/2018 08/04/2018  WBC 4.0 - 10.5 K/uL 14.3(H) 10.6(H) 12.0(H)  Hemoglobin 12.0 - 15.0 g/dL 9.2(L) 8.3(L) 9.4(L)  Hematocrit 36.0 - 46.0 % 30.9(L) 28.8(L) 31.9(L)  Platelets 150 - 400 K/uL 373 279 354      Assessment:  #1.  Melena.  She is at risk for reflux esophagitis as well as peptic ulcer disease.  Family has requested further work-up to find the source of GI blood loss.  #2.  Anemia.  Hemoglobin is coming up.  She has not received any transfusions during this admission.  Suspect she has anemia of chronic disease as well.  #3.  Edema involving upper extremities and feet.  Suspect edema secondary to profound hypoalbuminemia.  Albumin yesterday was 1.9 g.  #4.  History of mental retardation, congenital hydrocephalus and seizure disorder.   Plan:  Esophagogastroduodenoscopy under monitored anesthesia care this morning. Given her neurologic diagnoses I feel procedure can be performed more safely  under monitored anesthesia care. Procedure discussed with her Sister [name] who is at bedside and all questions were answered.

## 2018-08-06 NOTE — Plan of Care (Signed)

## 2018-08-06 NOTE — Progress Notes (Signed)
PROGRESS NOTE    April Watson  PJK:[id number] DOB: [birth year] DOA: 08/03/2018 PCP: Fayrene Helper, MD    Brief Narrative:  58 year old female with multiple medical problems including congenital hydrocephalus, mental retardation, quadriplegia, seizure disorder, chronic constipation, brought to the hospital due to dark-colored stools.  Stools were found to be heme positive.  She was admitted for further management of GI bleeding.   Assessment & Plan:   Active Problems:   Essential hypertension   Type 2 diabetes mellitus with neurological complications (HCC)   Quadriplegia and quadriparesis (HCC)   Sacral ulcer, limited to breakdown of skin (HCC)   Seizure disorder (HCC)   Decubitus ulcer of back, stage 3 (HCC)   Rectal bleeding   Hypokalemia   Rectal bleed   Melena   Anemia   1. GI bleeding.  Patient admitted with dark-colored stools.  Stools were heme positive.  Hemoglobin has declined since admission, but is now stable.  Seen by GI and underwent EGD with results as below. No further signs of bleeding.  Continue on PPI 2. Seizure disorder.  Continue on home dose of Keppra. 3. Hypokalemia.  Replaced 4. Sacral ulcer, present on admission, continue wound VAC therapy 5. Congenital hydrocephalus with mental retardation. 6. Abdominal distention.  CT scan shows extremely redundant colon which is distended with gas.  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  Discussed with GI and will try to place rectal tube for decompression.  Family is considering to do rectal tube on a daily basis at home for decompression.  Unclear if this can be assisted by home health.  Ultimately, may need to get surgical input.  Family is somewhat resistant to pursue surgery at this time.  May help to have surgical opinion.   DVT prophylaxis: SCDs Code Status: Full code Family Communication: Discussed with sister at the bedside Disposition Plan: Return home on discharge, possibly tomorrow after procedure    Consultants:   Gastroenterology  Procedures:  EGD:- Normal esophagus.                           - Z-line irregular, 34 cm from the incisors.                           - 4 cm hiatal hernia.                           - A few small gastric polyps. These were left alone.                           - Petechiae at duodenal bulb and second part of                            duodenum.                            - No specimens collected.  Antimicrobials:       Subjective: No vomiting or abdominal pain.  She had a bowel movement earlier this morning.  Rectal tube was removed last night and abdomen appears to be more distended today.  Objective: Vitals:   08/06/18 1000 08/06/18 1011 08/06/18 1030 08/06/18 1431  BP: 107/67  (!) 142/84 123/69  Pulse: (!) 105 (!) 106 (!) 106 Marland Kitchen)  110  Resp: 15 (!) 30  18  Temp:  97.6 F (36.4 C)    TempSrc:      SpO2: 100% 100% 100% 100%  Weight:      Height:        Intake/Output Summary (Last 24 hours) at 08/06/2018 2013 Last data filed at 08/06/2018 1802 Gross per 24 hour  Intake 480 ml  Output 400 ml  Net 80 ml   Filed Weights   08/03/18 1204 08/03/18 2239 08/05/18 0610  Weight: 77.1 kg 72.5 kg 81.4 kg    Examination:  General exam: Alert, awake, no distress Respiratory system: Clear to auscultation. Respiratory effort normal. Cardiovascular system:RRR. No murmurs, rubs, gallops. Gastrointestinal system: Abdomen is distended, soft and nontender. No organomegaly or masses felt. Normal bowel sounds heard. Central nervous system: Limited exam due to mental status Extremities: Lateral lower extremity contractures Skin: Wound on back with wound VAC in place Psychiatry: Nonverbal  Data Reviewed: I have personally reviewed following labs and imaging studies  CBC: Recent Labs  Lab 08/03/18 1428 08/03/18 2350 08/04/18 0457 08/05/18 0504 08/06/18 0453  WBC 19.9* 12.7* 12.0* 10.6* 14.3*  NEUTROABS 12.7*  --   --   --   --   HGB 11.8*  10.0* 9.4* 8.3* 9.2*  HCT 38.9 33.4* 31.9* 28.8* 30.9*  MCV 96.5 96.8 97.9 98.6 98.4  PLT 318 372 354 279 226   Basic Metabolic Panel: Recent Labs  Lab 08/03/18 1313 08/03/18 1428 08/04/18 0457 08/05/18 0504  NA 144  --  144 142  K 2.9*  --  3.5 3.9  CL 106  --  106 108  CO2 29  --  28 29  GLUCOSE 94  --  75 91  BUN 12  --  12 21*  CREATININE 0.51  --  0.49 0.35*  CALCIUM 8.5*  --  8.3* 8.0*  MG  --  1.9  --   --    GFR: Estimated Creatinine Clearance: 75.8 mL/min (A) (by C-G formula based on SCr of 0.35 mg/dL (L)). Liver Function Tests: Recent Labs  Lab 08/03/18 1313 08/04/18 0457 08/05/18 0504  AST 17 13* 12*  ALT 14 12 11   ALKPHOS 56 49 39  BILITOT 0.5 0.9 0.4  PROT 6.0* 5.6* 4.9*  ALBUMIN 2.4* 2.2* 1.9*   No results for input(s): LIPASE, AMYLASE in the last 168 hours. No results for input(s): AMMONIA in the last 168 hours. Coagulation Profile: No results for input(s): INR, PROTIME in the last 168 hours. Cardiac Enzymes: No results for input(s): CKTOTAL, CKMB, CKMBINDEX, TROPONINI in the last 168 hours. BNP (last 3 results) No results for input(s): PROBNP in the last 8760 hours. HbA1C: No results for input(s): HGBA1C in the last 72 hours. CBG: Recent Labs  Lab 08/05/18 2111  GLUCAP 131*   Lipid Profile: No results for input(s): CHOL, HDL, LDLCALC, TRIG, CHOLHDL, LDLDIRECT in the last 72 hours. Thyroid Function Tests: No results for input(s): TSH, T4TOTAL, FREET4, T3FREE, THYROIDAB in the last 72 hours. Anemia Panel: No results for input(s): VITAMINB12, FOLATE, FERRITIN, TIBC, IRON, RETICCTPCT in the last 72 hours. Sepsis Labs: No results for input(s): PROCALCITON, LATICACIDVEN in the last 168 hours.  Recent Results (from the past 240 hour(s))  SARS Coronavirus 2 (CEPHEID - Performed in [place] hospital lab), Hosp Order     Status: None   Collection Time: 08/03/18  1:33 PM   Specimen: Nasopharyngeal Swab  Result Value Ref Range Status   SARS  Coronavirus 2  NEGATIVE NEGATIVE Final    Comment: (NOTE) If result is NEGATIVE SARS-CoV-2 target nucleic acids are NOT DETECTED. The SARS-CoV-2 RNA is generally detectable in upper and lower  respiratory specimens during the acute phase of infection. The lowest  concentration of SARS-CoV-2 viral copies this assay can detect is 250  copies / mL. A negative result does not preclude SARS-CoV-2 infection  and should not be used as the sole basis for treatment or other  patient management decisions.  A negative result may occur with  improper specimen collection / handling, submission of specimen other  than nasopharyngeal swab, presence of viral mutation(s) within the  areas targeted by this assay, and inadequate number of viral copies  (<250 copies / mL). A negative result must be combined with clinical  observations, patient history, and epidemiological information. If result is POSITIVE SARS-CoV-2 target nucleic acids are DETECTED. The SARS-CoV-2 RNA is generally detectable in upper and lower  respiratory specimens dur ing the acute phase of infection.  Positive  results are indicative of active infection with SARS-CoV-2.  Clinical  correlation with patient history and other diagnostic information is  necessary to determine patient infection status.  Positive results do  not rule out bacterial infection or co-infection with other viruses. If result is PRESUMPTIVE POSTIVE SARS-CoV-2 nucleic acids MAY BE PRESENT.   A presumptive positive result was obtained on the submitted specimen  and confirmed on repeat testing.  While 2019 novel coronavirus  (SARS-CoV-2) nucleic acids may be present in the submitted sample  additional confirmatory testing may be necessary for epidemiological  and / or clinical management purposes  to differentiate between  SARS-CoV-2 and other Sarbecovirus currently known to infect humans.  If clinically indicated additional testing with an alternate test   methodology [phone]) is advised. The SARS-CoV-2 RNA is generally  detectable in upper and lower respiratory sp ecimens during the acute  phase of infection. The expected result is Negative. Fact Sheet for Patients:  StrictlyIdeas.no Fact Sheet for Healthcare Providers: BankingDealers.co.za This test is not yet approved or cleared by the [place] FDA and has been authorized for detection and/or diagnosis of SARS-CoV-2 by FDA under an Emergency Use Authorization (EUA).  This EUA will remain in effect (meaning this test can be used) for the duration of the COVID-19 declaration under Section 564(b)(1) of the Act, 21 U.S.C. section 360bbb-3(b)(1), unless the authorization is terminated or revoked sooner. Performed at [facility], [address] [place], [place] [address]          Radiology Studies: No results found.      Scheduled Meds: . dextromethorphan-guaiFENesin  1 tablet Oral BID  . montelukast  10 mg Oral QHS  . nutrition supplement (JUVEN)  1 packet Oral BID BM  . pantoprazole  40 mg Oral Daily  . PHENobarbital  32.4 mg Oral BID  . Ensure Max Protein  11 oz Oral BID  . simvastatin  10 mg Oral QHS  . [START ON 08/14/2018] Vitamin D (Ergocalciferol)  50,000 Units Oral Q14 Days   Continuous Infusions: . sodium chloride 250 mL (08/03/18 2357)     LOS: 1 day    Time spent: 98mins    Kathie Dike, MD [facility]   If 7PM-7AM, please contact night-coverage [email or web address]  08/06/2018, 8:13 PM

## 2018-08-06 NOTE — Anesthesia Preprocedure Evaluation (Signed)
Anesthesia Evaluation  Patient identified by MRN, date of birth, ID band Patient unresponsive    Reviewed: Allergy & Precautions, H&P , NPO status , Patient's Chart, lab work & pertinent test results, reviewed documented beta blocker date and time , Unable to perform ROS - Chart review only  Airway        Dental   Pulmonary neg pulmonary ROS,    Pulmonary exam normal        Cardiovascular hypertension, negative cardio ROS Normal cardiovascular exam     Neuro/Psych Seizures -,  PSYCHIATRIC DISORDERS Dementia    GI/Hepatic GERD  ,  Endo/Other  diabetes, Type 2  Renal/GU      Musculoskeletal   Abdominal   Peds  Hematology  (+) Blood dyscrasia, anemia ,   Anesthesia Other Findings UTA airway  Reproductive/Obstetrics negative OB ROS                             Anesthesia Physical Anesthesia Plan  ASA: III and emergent  Anesthesia Plan: General   Post-op Pain Management:    Induction:   PONV Risk Score and Plan: 2  Airway Management Planned:   Additional Equipment:   Intra-op Plan:   Post-operative Plan:   Informed Consent: I have reviewed the patients History and Physical, chart, labs and discussed the procedure including the risks, benefits and alternatives for the proposed anesthesia with the patient or authorized representative who has indicated his/her understanding and acceptance.       Plan Discussed with: Surgeon  Anesthesia Plan Comments:         Anesthesia Quick Evaluation

## 2018-08-06 NOTE — Anesthesia Postprocedure Evaluation (Signed)
Anesthesia Post Note  Patient: April Watson  Procedure(s) Performed: ESOPHAGOGASTRODUODENOSCOPY (EGD) WITH PROPOFOL (N/A )  Patient location during evaluation: PACU Anesthesia Type: General Level of consciousness: sedated Pain management: satisfactory to patient Vital Signs Assessment: post-procedure vital signs reviewed and stable Respiratory status: spontaneous breathing Cardiovascular status: blood pressure returned to baseline Postop Assessment: no apparent nausea or vomiting Anesthetic complications: no     Last Vitals:  Vitals:   08/05/18 2113 08/06/18 0639  BP:  127/79  Pulse:  (!) 115  Resp:  17  Temp: 37.5 C 37.1 C  SpO2:  98%    Last Pain:  Vitals:   08/06/18 0639  TempSrc: Oral                 Louann Sjogren

## 2018-08-06 NOTE — Op Note (Signed)
[facility] Patient Name: April Watson Procedure Date: 08/06/2018 8:29 AM MRN: [id number] Date of Birth: July 01, 1960 Attending MD: Hildred Laser , MD CSN: [id number] Age: 58 Admit Type: Inpatient Procedure:                Upper GI endoscopy Indications:              Melena Providers:                Hildred Laser, MD, Gerome Sam, RN, Randa Spike, Technician Referring MD:             Kathie Dike, MD Medicines:                Propofol per Anesthesia Complications:            No immediate complications. Estimated Blood Loss:     Estimated blood loss: none. Procedure:                Pre-Anesthesia Assessment:                           - Prior to the procedure, a History and Physical                            was performed, and patient medications and                            allergies were reviewed. The patient's tolerance of                            previous anesthesia was also reviewed. The risks                            and benefits of the procedure and the sedation                            options and risks were discussed with the patient.                            All questions were answered, and informed consent                            was obtained. Prior Anticoagulants: The patient has                            taken no previous anticoagulant or antiplatelet                            agents. ASA Grade Assessment: IV - A patient with                            severe systemic disease that is a constant threat  to life. After reviewing the risks and benefits,                            the patient was deemed in satisfactory condition to                            undergo the procedure.                           After obtaining informed consent, the endoscope was                            passed under direct vision. Throughout the                            procedure, the patient's blood pressure,  pulse, and                            oxygen saturations were monitored continuously. The                            [id number]-[id number] ([id number]) scope was introduced through the                            mouth, and advanced to the second part of duodenum.                            The upper GI endoscopy was accomplished without                            difficulty. The patient tolerated the procedure                            well. Scope In: 9:21:00 AM Scope Out: 9:25:21 AM Total Procedure Duration: 0 hours 4 minutes 21 seconds  Findings:      The examined esophagus was normal.      The Z-line was irregular and was found 34 cm from the incisors.      A 4 cm hiatal hernia was present.      A few small sessile polyps with no stigmata of recent bleeding were       found in the gastric body.      The exam of the stomach was otherwise normal.      petechiae at distal bulb and second part of duodenum. Impression:               - Normal esophagus.                           - Z-line irregular, 34 cm from the incisors.                           - 4 cm hiatal hernia.                           - A few small gastric polyps.  These were left alone.                           - Petechiae at duodenal bulb and second part of                            duodenum.                           - No specimens collected. Moderate Sedation:      Per Anesthesia Care Recommendation:           - Return patient to hospital ward for ongoing care.                           - Resume regular diet today.                           - Continue present medications.                           - No aspirin, ibuprofen, naproxen, or other                            non-steroidal anti-inflammatory drugs. Procedure Code(s):        --- Professional ---                           [phone], Esophagogastroduodenoscopy, flexible,                            transoral; diagnostic, including collection of                            specimen(s)  by brushing or washing, when performed                            (separate procedure) Diagnosis Code(s):        --- Professional ---                           K22.8, Other specified diseases of esophagus                           K44.9, Diaphragmatic hernia without obstruction or                            gangrene                           K31.7, Polyp of stomach and duodenum                           K92.1, Melena (includes Hematochezia) CPT copyright 2019 American [employer] Association. All rights reserved. The codes documented in this report are preliminary and upon coder review may  be revised to meet current compliance requirements. Hildred Laser, MD Hildred Laser, MD 08/06/2018 9:45:39 AM This report has been signed electronically. Number  of Addenda: 0

## 2018-08-07 DIAGNOSIS — K598 Other specified functional intestinal disorders: Secondary | ICD-10-CM

## 2018-08-07 DIAGNOSIS — D649 Anemia, unspecified: Secondary | ICD-10-CM

## 2018-08-07 DIAGNOSIS — K5981 Ogilvie syndrome: Secondary | ICD-10-CM

## 2018-08-07 MED ORDER — POLYETHYLENE GLYCOL 3350 17 GM/SCOOP PO POWD: 17.0000 g | ORAL | 1 refills | Status: DC

## 2018-08-07 MED ORDER — BISACODYL 10 MG RE SUPP: 10.0000 mg | RECTAL | 0 refills | Status: AC | PRN

## 2018-08-07 MED ORDER — PANTOPRAZOLE SODIUM 40 MG PO TBEC: 40.0000 mg | DELAYED_RELEASE_TABLET | Freq: Every day | ORAL | 0 refills | Status: DC

## 2018-08-07 MED ORDER — LINACLOTIDE 145 MCG PO CAPS: 145.0000 ug | ORAL_CAPSULE | Freq: Every day | ORAL | 1 refills | Status: DC

## 2018-08-07 MED ORDER — POTASSIUM CHLORIDE ER 10 MEQ PO TBCR: 10.0000 meq | EXTENDED_RELEASE_TABLET | Freq: Two times a day (BID) | ORAL | 0 refills | Status: DC

## 2018-08-07 NOTE — Plan of Care (Signed)

## 2018-08-07 NOTE — Progress Notes (Signed)
Patient discharged and transported with [place] home at 2349. Vitals last obtained at 2247. Patient belongings and discharge instructions sent with patient's sister/ primary caregiver. Wound vac dressing intact and functioning properly upon discharge. Patient's sister denies any questions at this time.

## 2018-08-07 NOTE — Consult Note (Signed)
Reason for Consult: Pseudoobstruction of colon Referring Physician: Dr. [name] is an 58 y.o. female.  HPI: Patient is a 58 year old black female with a history of congenital hydrocephalus, mental retardation, quadriplegia and quadriparesis, and seizure disorder who has had a history of chronic constipation and who presents with heme positive stools and abdominal distention.  She has been followed by GI.  She recently had a rectal tube placed with decompression of her abdomen.  She was also started on Linzess.  I have been requested to see the patient from the surgery standpoint.  History is obtained from patient's family.  Past Medical History:  Diagnosis Date  . 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 Chronic constipation   . Congenital hydrocephalus ([place])   . GERD (gastroesophageal reflux disease)   . Hyperlipidemia   . Hypertension   . Mental retardation   . Seizure disorder ([place])   . Seizures ([place])     Past Surgical History:  Procedure Laterality Date  . COLONOSCOPY  02/08/06   Rourk-mild anal stenosis, multiple erosions o fprox rectum and sigmoid. Long redundant colon. Bx  ?ischemic colitis  . COLONOSCOPY  06/12/2011   Procedure: COLONOSCOPY;  Surgeon: Daneil Dolin, MD;  Location: [facility];  Service: Endoscopy;  Laterality: N/A;  11:00  . FLEXIBLE SIGMOIDOSCOPY  08/14/2003   Rourk- Normal-appearing rectum, normal-appearing sigmoid colon to 50  cm.  Relatively poor prep made exam more difficult  . patient reports that they had brain surgery attempted for hydrocephalus in infancy. However , the procedure was aborted because she coded. .      Family History  Problem Relation Age of Onset  . Kidney failure Mother        on dialysis  . Hypertension Mother   . Kidney disease Mother   . Prostate cancer Father   . Hypertension Father   . Glaucoma Father   . Colon cancer Sister 50  . Multiple myeloma Sister   . Hypertension Sister   .  Glaucoma Sister   . Allergies Sister   . Hypertension Sister   . Glaucoma Sister   . Hyperlipidemia Sister   . Hypertension Brother   . Allergies Brother   . Hyperlipidemia Brother   . Colon cancer Other        family history   . Lymphoma Other        family history     Social History:  reports that she has never smoked. She has never used smokeless tobacco. She reports that she does not drink alcohol or use drugs.  Allergies: No Known Allergies  Medications: I have reviewed the patient's current medications.  Results for orders placed or performed during the hospital encounter of 08/03/18 (from the past 48 hour(s))  Glucose, capillary     Status: Abnormal   Collection Time: 08/05/18  9:11 PM  Result Value Ref Range   Glucose-Capillary 131 (H) 70 - 99 mg/dL   Comment 1 Notify RN    Comment 2 Document in Chart   CBC     Status: Abnormal   Collection Time: 08/06/18  4:53 AM  Result Value Ref Range   WBC 14.3 (H) 4.0 - 10.5 K/uL   RBC 3.14 (L) 3.87 - 5.11 MIL/uL   Hemoglobin 9.2 (L) 12.0 - 15.0 g/dL   HCT 30.9 (L) 36.0 - 46.0 %   MCV 98.4 80.0 - 100.0 fL   MCH 29.3 26.0 - 34.0  pg   MCHC 29.8 (L) 30.0 - 36.0 g/dL   RDW 18.4 (H) 11.5 - 15.5 %   Platelets 373 150 - 400 K/uL   nRBC 0.0 0.0 - 0.2 %    Comment: Performed at [facility], [address] [place], [place] [address]    No results found.  ROS:  Review of systems not obtained due to patient factors.  Blood pressure (!) 146/74, pulse (!) 107, temperature 98.4 F (36.9 C), temperature source Oral, resp. rate 20, height '5\' 2"'  (1.575 m), weight 78.5 kg, SpO2 97 %. Physical Exam: Black female who is lying in the bed in a somewhat contracted state. Lungs are clear to auscultation with good breath sounds bilaterally Heart examination reveals regular rate and rhythm without S3, S4, murmurs Abdomen is rotund and soft, leaning to the left side.  No tenderness is noted.  No masses are noted.  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  I told them that any surgical intervention would be high risk for April Watson and would include cardiopulmonary difficulties and probable prolonged ventilatory support given her neurologic status.  I do not think it is feasible to just offer a colostomy, thus a subtotal colectomy would be needed to treat her condition.  Again, this would be a very high risk procedure given all her comorbidities.  She does not need surgical intervention at this time, but I did make them aware that she is starting to become more symptomatic with her pseudoobstruction of the colon and that the risk of perforation or ischemia increases with time.  I told them that they needed to consider her DNR status to include surgical interventions and prolonged ventilatory support.  They were very realistic and all their questions were answered.  I would try to maximize medical therapy as much as possible.  April Watson 08/07/2018, 9:36 AM

## 2018-08-07 NOTE — Progress Notes (Signed)
Call placed to [facility] to request ambulance transport to home.

## 2018-08-07 NOTE — Discharge Summary (Signed)
Physician Discharge Summary  April Watson RWE:[id number] DOB: [birth year] DOA: 08/03/2018  PCP: Fayrene Helper, MD  Admit date: 08/03/2018 Discharge date: 08/07/2018  Admitted From: Home Disposition: Home  Recommendations for Outpatient Follow-up:  1. Follow up with PCP in 1-2 weeks 2. Please obtain BMP/CBC in one week 3. Recommend continued conversations around [place], end-of-life issues.  Home Health: Home health RN   Discharge Condition: Stable, long-term prognosis is poor CODE STATUS: Full code Diet recommendation: Carb modified diet  Brief/Interim Summary: 58 year old female with multiple medical problems including congenital hydrocephalus, mental retardation, quadriplegia, seizure disorder, chronic constipation, was brought to the hospital due to dark-colored stools.  Stools were found to be heme positive.  She was admitted for further management of GI bleeding.  Discharge Diagnoses:  Active Problems:   Essential hypertension   Type 2 diabetes mellitus with neurological complications (HCC)   Quadriplegia and quadriparesis (HCC)   Sacral ulcer, limited to breakdown of skin (HCC)   Seizure disorder (HCC)   Decubitus ulcer of back, stage 3 (HCC)   Rectal bleeding   Hypokalemia   Rectal bleed   Melena   Anemia   Pseudo-obstruction of colon  1. GI bleeding.  Patient initially presented with dark-colored stools.  Stools were found to be heme positive.  Hemoglobin did decline since admission, but did not drop to the point of needing transfusion.  She was seen by GI and underwent EGD that did not show any obvious source of bleeding.  Overall signs of bleeding have resolved and hemoglobin has been stable.  She is continued on PPI. 2. Seizure disorder.  She was continued on home dose of phenobarbital. 3. Hypokalemia.  This was replaced. 4. Sacral ulcer, stage III, present on admission.  Continue wound VAC therapy and follow-up with general surgery. 5. Congenital  hydrocephalus with mental retardation. 6. Abdominal distention, secondary to colonic pseudoobstruction/Ogilvie syndrome.  Patient was seen by both GI and general surgery.  She would be considered a high risk candidate for surgery and family does not want to consider that at this time.  It was recommended that patient could be rectally decompressed by intermittently inserting a 51F Foley catheter for decompression.  Family wishes to use this method for now.  They were educated on how to perform this procedure and did practice prior to discharge.  Home health services will be set up to provide supplies. 7. Anasarca secondary to hypoalbuminemia. 8. Goals of care.  Issues such as resuscitation, life support and quality of life issues were discussed with the family.  They wish to have further discussions at home regarding this.  Recommend that these conversations be continued as an outpatient since DNR would be most appropriate for this patient.  Discharge Instructions  Discharge Instructions    Diet - low sodium heart healthy   Complete by: As directed    Increase activity slowly   Complete by: As directed      Allergies as of 08/07/2018   No Known Allergies     Medication List    STOP taking these medications   ArgiMent Pack   cefdinir 300 MG capsule Commonly known as: OMNICEF   metroNIDAZOLE 500 MG tablet Commonly known as: FLAGYL     TAKE these medications   benzonatate 100 MG capsule Commonly known as: TESSALON Take by mouth 2 (two) times daily as needed for cough.   bisacodyl 10 MG suppository Commonly known as: Dulcolax Place 1 suppository (10 mg total) rectally as needed for  moderate constipation.   dextromethorphan-guaiFENesin 30-600 MG 12hr tablet Commonly known as: MUCINEX DM Take 1 tablet by mouth 2 (two) times daily.   docusate sodium 100 MG capsule Commonly known as: Stool Softener TAKE 1 CAPSULE TWICE DAILY FOR CONSTIPATION. What changed:   how much to  take  how to take this  when to take this  additional instructions   Ensure Max Protein Liqd Take 330 mLs (11 oz total) by mouth 2 (two) times daily.   nutrition supplement (JUVEN) Pack Take 1 packet by mouth 2 (two) times daily between meals.   linaclotide 145 MCG Caps capsule Commonly known as: LINZESS Take 1 capsule (145 mcg total) by mouth daily before breakfast. Start taking on: August 08, 2018   montelukast 10 MG tablet Commonly known as: SINGULAIR Take 1 tablet (10 mg total) by mouth at bedtime.   pantoprazole 40 MG tablet Commonly known as: PROTONIX Take 1 tablet (40 mg total) by mouth daily. Start taking on: August 08, 2018 What changed:   medication strength  how much to take   PHENobarbital 32.4 MG tablet Commonly known as: LUMINAL TAKE ONE TABLET BY MOUTH TWICE DAILY FOR SEIZURE DISORDER. What changed: See the new instructions.   polyethylene glycol powder 17 GM/SCOOP powder Commonly known as: GLYCOLAX/MIRALAX Take 17 g by mouth See admin instructions. MIX 1 CAPFUL (17 G) IN 8 OUNCES OF WATER/JUICE AND DRINK TWICE A DAY FOR CONSTIPATION. What changed: See the new instructions.   potassium chloride 10 MEQ tablet Commonly known as: K-DUR Take 1 tablet (10 mEq total) by mouth 2 (two) times daily with a meal.   simvastatin 10 MG tablet Commonly known as: ZOCOR TAKE (1) TABLET BY MOUTH AT BEDTIME FOR CHOLESTEROL. What changed: See the new instructions.   Vitamin D (Ergocalciferol) 1.25 MG (50000 UT) Caps capsule Commonly known as: DRISDOL TAKE 1 CAPSULE BY MOUTH EVERY 2 WEEKS. What changed: See the new instructions.       No Known Allergies  Consultations:  GI  General surgery   Procedures/Studies: Ct Abdomen Pelvis Wo Contrast  Result Date: 08/03/2018 CLINICAL DATA:  Abdominal distension, bloody stools, wound VAC EXAM: CT ABDOMEN AND PELVIS WITHOUT CONTRAST TECHNIQUE: Multidetector CT imaging of the abdomen and pelvis was performed following  the standard protocol without IV contrast. COMPARISON:  07/20/2018 FINDINGS: Lower chest: No acute abnormality. Hepatobiliary: No solid liver abnormality is seen. Numerous gallstones. No gallbladder wall thickening, or biliary dilatation. Pancreas: Unremarkable. No pancreatic ductal dilatation or surrounding inflammatory changes. Spleen: Normal in size without significant abnormality. Adrenals/Urinary Tract: Adrenal glands are unremarkable. Kidneys are normal, without renal calculi, solid lesion, or hydronephrosis. Bladder is unremarkable. Stomach/Bowel: Stomach is within normal limits. Normal appendix. 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 Rectal tube in position. There is no obvious evidence of colonic volvulus. Vascular/Lymphatic: No significant vascular findings are present. No enlarged abdominal or pelvic lymph nodes. Reproductive: No mass or other significant abnormality. Other: No abdominal wall hernia or abnormality. No abdominopelvic ascites. Musculoskeletal: Thoracolumbar levoscoliosis and nonweightbearing deformities of the bilateral hips. Diffuse sarcopenia. There has been interval drainage of a large fluid collection about the left back and flank with placement of a wound VAC. IMPRESSION: 1. 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 There is no obvious evidence of colonic volvulus. Rectal tube in  position. 2. There has been interval drainage of a large fluid collection about the left back and flank with placement of a wound VAC. 3.  Other chronic and incidental findings as detailed above. Electronically Signed   By: Eddie Candle M.D.   On: 08/03/2018 15:59   Ct Abdomen Pelvis Wo Contrast  Result Date: 07/20/2018 CLINICAL DATA:  Abdomen  distension EXAM: CT ABDOMEN AND PELVIS WITHOUT CONTRAST TECHNIQUE: Multidetector CT imaging of the abdomen and pelvis was performed following the standard protocol without IV contrast. COMPARISON:  Barium enema 05/14/2003 FINDINGS: Lower chest: Lung bases demonstrate partial atelectasis at the left base. Trace left pleural effusion. Heart size within normal limits. Hepatobiliary: No focal hepatic abnormality. Numerous calcified gallstones. No biliary dilatation. Pancreas: Unremarkable. No pancreatic ductal dilatation or surrounding inflammatory changes. Spleen: Normal in size without focal abnormality. Adrenals/Urinary Tract: Adrenal glands are normal. Kidneys show no hydronephrosis. Probable cyst upper pole left kidney. Urinary bladder is unremarkable. Stomach/Bowel: The stomach is nonenlarged. No dilated small bowel. Extremely tortuous and redundant colon. No colon wall thickening. Massive distension of the sigmoid colon up to 16 cm with fluid appearing stools in the rectum. Negative appendix. Vascular/Lymphatic: Nonaneurysmal aorta. No significantly enlarged lymph nodes. Reproductive: Enlarged uterus with suspected fundal mass measuring 7.8 cm. No definite adnexal mass. Other: No free air or free fluid. Musculoskeletal: Scoliosis and degenerative changes of the spine. Chronic appearing bony remodeling of the proximal left femur. Skin thickening and irregular soft tissue density left lateral hip extending toward the femoral trochanter which may reflect decubitus ulcer. No acute gas or fluid in this region. Left upper back soft tissue wound or ulcer with subcutaneous edema and multiple foci of gas. This is contiguous with a large gas and fluid collection measuring at least 3.4 x 9.7 cm within the left flank subcutaneous soft tissues. No gross bony destructive change. IMPRESSION: 1. Anatomy somewhat distorted by patient's scoliosis. There is massive distention of the sigmoid colon which is contiguous with fluid  filled rectum. No discrete colonic narrowing or evidence for volvulus. Findings suggest functional obstruction at the level of the rectosigmoid colon. Negative for perforation. 2. Large wound involving the skin and subcutaneous tissues of the left back and flank region with multiple foci of gas. Dominant fluid and gas collection in the left flank soft tissues measuring at least 9.7 cm, concerning for abscess or potential necrotic infection. Recommend correlation with direct inspection. 3. Numerous gallstones 4. Bulky uterus with suspected fibroids. Electronically Signed   By: Donavan Foil M.D.   On: 07/20/2018 21:23   Dg Chest Port 1 View  Result Date: 07/22/2018 CLINICAL DATA:  Cough.  Draining wound. EXAM: PORTABLE CHEST 1 VIEW COMPARISON:  July 09, 2009 FINDINGS: The distal tip of the right PICC line is difficult to visualize but likely terminates near the caval atrial junction. Severe scoliotic changes are noted. The cardiomediastinal silhouette is stable. No pneumothorax. No nodules or masses. No focal infiltrates. No other acute abnormalities are identified. IMPRESSION: Severe scoliosis with low lung volumes limit evaluation. The distal tip of the right PICC line is not well seen but likely near the caval atrial junction. No other acute abnormalities. Electronically Signed   By: Dorise Bullion III M.D   On: 07/22/2018 15:37   [place] Ekg Site Rite  Result Date: 07/20/2018 If Site Rite image not attached, placement could not be confirmed due to current cardiac rhythm.      Subjective: Patient is nonverbal.  Discharge Exam: Vitals:  08/06/18 2107 08/07/18 0458 08/07/18 0500 08/07/18 1410  BP: (!) 156/82 (!) 146/74  131/78  Pulse: (!) 114 (!) 107  (!) 110  Resp: 18 20  18   Temp: 99.1 F (37.3 C) 98.4 F (36.9 C)  98.1 F (36.7 C)  TempSrc: Oral Oral  Oral  SpO2: 100% 97%  100%  Weight:   78.5 kg   Height:        General: Pt is alert, awake, not in acute distress Cardiovascular: RRR,  S1/S2 +, no rubs, no gallops Respiratory: CTA bilaterally, no wheezing, no rhonchi Abdominal: Soft, NT, distended, bowel sounds + Extremities: Anasarca    The results of significant diagnostics from this hospitalization (including imaging, microbiology, ancillary and laboratory) are listed below for reference.     Microbiology: Recent Results (from the past 240 hour(s))  SARS Coronavirus 2 (CEPHEID - Performed in [place] hospital lab), Hosp Order     Status: None   Collection Time: 08/03/18  1:33 PM   Specimen: Nasopharyngeal Swab  Result Value Ref Range Status   SARS Coronavirus 2 NEGATIVE NEGATIVE Final    Comment: (NOTE) If result is NEGATIVE SARS-CoV-2 target nucleic acids are NOT DETECTED. The SARS-CoV-2 RNA is generally detectable in upper and lower  respiratory specimens during the acute phase of infection. The lowest  concentration of SARS-CoV-2 viral copies this assay can detect is 250  copies / mL. A negative result does not preclude SARS-CoV-2 infection  and should not be used as the sole basis for treatment or other  patient management decisions.  A negative result may occur with  improper specimen collection / handling, submission of specimen other  than nasopharyngeal swab, presence of viral mutation(s) within the  areas targeted by this assay, and inadequate number of viral copies  (<250 copies / mL). A negative result must be combined with clinical  observations, patient history, and epidemiological information. If result is POSITIVE SARS-CoV-2 target nucleic acids are DETECTED. The SARS-CoV-2 RNA is generally detectable in upper and lower  respiratory specimens dur ing the acute phase of infection.  Positive  results are indicative of active infection with SARS-CoV-2.  Clinical  correlation with patient history and other diagnostic information is  necessary to determine patient infection status.  Positive results do  not rule out bacterial infection or  co-infection with other viruses. If result is PRESUMPTIVE POSTIVE SARS-CoV-2 nucleic acids MAY BE PRESENT.   A presumptive positive result was obtained on the submitted specimen  and confirmed on repeat testing.  While 2019 novel coronavirus  (SARS-CoV-2) nucleic acids may be present in the submitted sample  additional confirmatory testing may be necessary for epidemiological  and / or clinical management purposes  to differentiate between  SARS-CoV-2 and other Sarbecovirus currently known to infect humans.  If clinically indicated additional testing with an alternate test  methodology [phone]) is advised. The SARS-CoV-2 RNA is generally  detectable in upper and lower respiratory sp ecimens during the acute  phase of infection. The expected result is Negative. Fact Sheet for Patients:  StrictlyIdeas.no Fact Sheet for Healthcare Providers: BankingDealers.co.za This test is not yet approved or cleared by the [place] FDA and has been authorized for detection and/or diagnosis of SARS-CoV-2 by FDA under an Emergency Use Authorization (EUA).  This EUA will remain in effect (meaning this test can be used) for the duration of the COVID-19 declaration under Section 564(b)(1) of the Act, 21 U.S.C. section 360bbb-3(b)(1), unless the authorization is terminated or revoked sooner.  Performed at [facility], [address] [place], [place] [address]      Labs: BNP (last 3 results) No results for input(s): BNP in the last 8760 hours. Basic Metabolic Panel: Recent Labs  Lab 08/03/18 1313 08/03/18 1428 08/04/18 0457 08/05/18 0504  NA 144  --  144 142  K 2.9*  --  3.5 3.9  CL 106  --  106 108  CO2 29  --  28 29  GLUCOSE 94  --  75 91  BUN 12  --  12 21*  CREATININE 0.51  --  0.49 0.35*  CALCIUM 8.5*  --  8.3* 8.0*  MG  --  1.9  --   --    Liver Function Tests: Recent Labs  Lab 08/03/18 1313 08/04/18 0457 08/05/18 0504  AST 17  13* 12*  ALT 14 12 11   ALKPHOS 56 49 39  BILITOT 0.5 0.9 0.4  PROT 6.0* 5.6* 4.9*  ALBUMIN 2.4* 2.2* 1.9*   No results for input(s): LIPASE, AMYLASE in the last 168 hours. No results for input(s): AMMONIA in the last 168 hours. CBC: Recent Labs  Lab 08/03/18 1428 08/03/18 2350 08/04/18 0457 08/05/18 0504 08/06/18 0453  WBC 19.9* 12.7* 12.0* 10.6* 14.3*  NEUTROABS 12.7*  --   --   --   --   HGB 11.8* 10.0* 9.4* 8.3* 9.2*  HCT 38.9 33.4* 31.9* 28.8* 30.9*  MCV 96.5 96.8 97.9 98.6 98.4  PLT 318 372 354 279 373   Cardiac Enzymes: No results for input(s): CKTOTAL, CKMB, CKMBINDEX, TROPONINI in the last 168 hours. BNP: Invalid input(s): POCBNP CBG: Recent Labs  Lab 08/05/18 2111  GLUCAP 131*   D-Dimer No results for input(s): DDIMER in the last 72 hours. Hgb A1c No results for input(s): HGBA1C in the last 72 hours. Lipid Profile No results for input(s): CHOL, HDL, LDLCALC, TRIG, CHOLHDL, LDLDIRECT in the last 72 hours. Thyroid function studies No results for input(s): TSH, T4TOTAL, T3FREE, THYROIDAB in the last 72 hours.  Invalid input(s): FREET3 Anemia work up No results for input(s): VITAMINB12, FOLATE, FERRITIN, TIBC, IRON, RETICCTPCT in the last 72 hours. Urinalysis No results found for: COLORURINE, APPEARANCEUR, [place], [place], GLUCOSEU, [place], [place], KETONESUR, PROTEINUR, UROBILINOGEN, NITRITE, LEUKOCYTESUR Sepsis Labs Invalid input(s): PROCALCITONIN,  WBC,  LACTICIDVEN Microbiology Recent Results (from the past 240 hour(s))  SARS Coronavirus 2 (CEPHEID - Performed in [place] hospital lab), Hosp Order     Status: None   Collection Time: 08/03/18  1:33 PM   Specimen: Nasopharyngeal Swab  Result Value Ref Range Status   SARS Coronavirus 2 NEGATIVE NEGATIVE Final    Comment: (NOTE) If result is NEGATIVE SARS-CoV-2 target nucleic acids are NOT DETECTED. The SARS-CoV-2 RNA is generally detectable in upper and lower  respiratory specimens during the  acute phase of infection. The lowest  concentration of SARS-CoV-2 viral copies this assay can detect is 250  copies / mL. A negative result does not preclude SARS-CoV-2 infection  and should not be used as the sole basis for treatment or other  patient management decisions.  A negative result may occur with  improper specimen collection / handling, submission of specimen other  than nasopharyngeal swab, presence of viral mutation(s) within the  areas targeted by this assay, and inadequate number of viral copies  (<250 copies / mL). A negative result must be combined with clinical  observations, patient history, and epidemiological information. If result is POSITIVE SARS-CoV-2 target nucleic acids are DETECTED. The SARS-CoV-2 RNA  is generally detectable in upper and lower  respiratory specimens dur ing the acute phase of infection.  Positive  results are indicative of active infection with SARS-CoV-2.  Clinical  correlation with patient history and other diagnostic information is  necessary to determine patient infection status.  Positive results do  not rule out bacterial infection or co-infection with other viruses. If result is PRESUMPTIVE POSTIVE SARS-CoV-2 nucleic acids MAY BE PRESENT.   A presumptive positive result was obtained on the submitted specimen  and confirmed on repeat testing.  While 2019 novel coronavirus  (SARS-CoV-2) nucleic acids may be present in the submitted sample  additional confirmatory testing may be necessary for epidemiological  and / or clinical management purposes  to differentiate between  SARS-CoV-2 and other Sarbecovirus currently known to infect humans.  If clinically indicated additional testing with an alternate test  methodology [phone]) is advised. The SARS-CoV-2 RNA is generally  detectable in upper and lower respiratory sp ecimens during the acute  phase of infection. The expected result is Negative. Fact Sheet for Patients:   StrictlyIdeas.no Fact Sheet for Healthcare Providers: BankingDealers.co.za This test is not yet approved or cleared by the [place] FDA and has been authorized for detection and/or diagnosis of SARS-CoV-2 by FDA under an Emergency Use Authorization (EUA).  This EUA will remain in effect (meaning this test can be used) for the duration of the COVID-19 declaration under Section 564(b)(1) of the Act, 21 U.S.C. section 360bbb-3(b)(1), unless the authorization is terminated or revoked sooner. Performed at [facility], [address] [place], [place] [address]      Time coordinating discharge: 74mins  SIGNED:   Kathie Dike, MD  [facility] 08/07/2018, 7:18 PM   If 7PM-7AM, please contact night-coverage [email or web address]

## 2018-08-08 DIAGNOSIS — L02212 Cutaneous abscess of back [any part, except buttock]: Secondary | ICD-10-CM | POA: Diagnosis not present

## 2018-08-08 DIAGNOSIS — L89152 Pressure ulcer of sacral region, stage 2: Secondary | ICD-10-CM | POA: Diagnosis not present

## 2018-08-08 DIAGNOSIS — B966 Bacteroides fragilis [B. fragilis] as the cause of diseases classified elsewhere: Secondary | ICD-10-CM | POA: Diagnosis not present

## 2018-08-08 DIAGNOSIS — L03312 Cellulitis of back [any part except buttock]: Secondary | ICD-10-CM | POA: Diagnosis not present

## 2018-08-08 DIAGNOSIS — Z4800 Encounter for change or removal of nonsurgical wound dressing: Secondary | ICD-10-CM | POA: Diagnosis not present

## 2018-08-08 DIAGNOSIS — A419 Sepsis, unspecified organism: Secondary | ICD-10-CM | POA: Diagnosis not present

## 2018-08-10 ENCOUNTER — Telehealth: Payer: Self-pay

## 2018-08-10 ENCOUNTER — Telehealth: Payer: Self-pay | Admitting: *Deleted

## 2018-08-10 ENCOUNTER — Other Ambulatory Visit: Payer: Self-pay | Admitting: Family Medicine

## 2018-08-10 ENCOUNTER — Encounter (HOSPITAL_COMMUNITY): Payer: Self-pay | Admitting: Internal Medicine

## 2018-08-10 DIAGNOSIS — A419 Sepsis, unspecified organism: Secondary | ICD-10-CM | POA: Diagnosis not present

## 2018-08-10 DIAGNOSIS — Z4800 Encounter for change or removal of nonsurgical wound dressing: Secondary | ICD-10-CM | POA: Diagnosis not present

## 2018-08-10 DIAGNOSIS — B966 Bacteroides fragilis [B. fragilis] as the cause of diseases classified elsewhere: Secondary | ICD-10-CM | POA: Diagnosis not present

## 2018-08-10 DIAGNOSIS — L03312 Cellulitis of back [any part except buttock]: Secondary | ICD-10-CM | POA: Diagnosis not present

## 2018-08-10 DIAGNOSIS — L89152 Pressure ulcer of sacral region, stage 2: Secondary | ICD-10-CM | POA: Diagnosis not present

## 2018-08-10 DIAGNOSIS — L02212 Cutaneous abscess of back [any part, except buttock]: Secondary | ICD-10-CM | POA: Diagnosis not present

## 2018-08-10 MED ORDER — DIPHENOXYLATE-ATROPINE 2.5-0.025 MG PO TABS: ORAL_TABLET | ORAL | 0 refills | Status: DC

## 2018-08-10 NOTE — Telephone Encounter (Signed)
pls let them know immodoium is sent in no more than 2 per day only if needed, do not want constipation, also needs the [facility] call and video follow up with me pls if you dohtis remove from [facility] box, already one dauy late on the call

## 2018-08-10 NOTE — Telephone Encounter (Signed)
Transition Care Management Follow-up Telephone Call   Date discharged?  08/07/18              How have you been since you were released from the hospital? fair, no appetite, not eating, and has started vomiting today   Do you understand why you were in the hospital? "lazy" colon, bloody stools   Do you understand the discharge instructions? yes   Where were you discharged to? home   Items Reviewed:  Medications reviewed: yes  Allergies reviewed: yes  Dietary changes reviewed: yes  Referrals reviewed: yes, no new referrals   Functional Questionnaire:   Activities of Daily Living (ADLs):  is bedridden right now    Any transportation issues/concerns?: no   Any patient concerns? her loss of appetite, not eating, and vomiting   Confirmed importance and date/time of follow-up visits scheduled      Confirmed with patient if condition begins to worsen call PCP or go to the ER.  Patient was given the office number and encouraged to call back with question or concerns.  :

## 2018-08-10 NOTE — Telephone Encounter (Signed)
Inez Catalina with [facility] home health called about Ms. April Watson. Said she was d/c from the hospital this weekend and she was on multiple medications for bowels. Now she is having diarrhea and she was wondering if some of the meds for her bowels could be decreased to slow down the diarrhea. She can be reached at [phone]

## 2018-08-10 NOTE — Telephone Encounter (Signed)
Spoke with [name] (caregiver) and scheduled telephone call to discuss overall health of patient for tomorrow at 10:40am

## 2018-08-10 NOTE — Telephone Encounter (Signed)
Let  Family know I will send medication for nausea and it os possible tha At her vomiting is from partial obstruction, so if worsens will need additional care . I am boffering phone consult re her overall condition if interested and arrange a time on my schedule tomorrw if requested pls

## 2018-08-10 NOTE — Telephone Encounter (Signed)
Spoke with caregiver during [facility] call. She states since bringing patient home she has no appetite, isn't eating and today has started vomiting. What do you recommend? She will be waiting for a call back. And she is also waiting on the [place] to come in today.

## 2018-08-11 ENCOUNTER — Other Ambulatory Visit: Payer: Self-pay

## 2018-08-11 ENCOUNTER — Ambulatory Visit: Payer: Medicare Other | Admitting: Family Medicine

## 2018-08-11 ENCOUNTER — Telehealth: Payer: Self-pay | Admitting: Family Medicine

## 2018-08-11 DIAGNOSIS — I10 Essential (primary) hypertension: Secondary | ICD-10-CM

## 2018-08-11 MED ORDER — ONDANSETRON HCL 4 MG PO TABS: 4.0000 mg | ORAL_TABLET | Freq: Three times a day (TID) | ORAL | 0 refills | Status: DC | PRN

## 2018-08-11 NOTE — Telephone Encounter (Signed)
Sp peaking with [name] and [name]. Since discharge she has had diarrhea , possible blood in stool yesterday, abdomen does  Not seem as  Distended, and is soft , states today her bowel are still loose, wil; rx zofran, and h/h please order chem 7 and eGFR to be drawn next week Monday by home health

## 2018-08-11 NOTE — Telephone Encounter (Signed)
Family aware  

## 2018-08-11 NOTE — Addendum Note (Signed)
Addended by: Eual Fines on: 08/11/2018 11:17 AM   Modules accepted: Orders

## 2018-08-11 NOTE — Telephone Encounter (Signed)
Orders faxed to [facility]

## 2018-08-12 DIAGNOSIS — L02212 Cutaneous abscess of back [any part, except buttock]: Secondary | ICD-10-CM | POA: Diagnosis not present

## 2018-08-12 DIAGNOSIS — B966 Bacteroides fragilis [B. fragilis] as the cause of diseases classified elsewhere: Secondary | ICD-10-CM | POA: Diagnosis not present

## 2018-08-12 DIAGNOSIS — L03312 Cellulitis of back [any part except buttock]: Secondary | ICD-10-CM | POA: Diagnosis not present

## 2018-08-12 DIAGNOSIS — Z4800 Encounter for change or removal of nonsurgical wound dressing: Secondary | ICD-10-CM | POA: Diagnosis not present

## 2018-08-12 DIAGNOSIS — A419 Sepsis, unspecified organism: Secondary | ICD-10-CM | POA: Diagnosis not present

## 2018-08-12 DIAGNOSIS — L89152 Pressure ulcer of sacral region, stage 2: Secondary | ICD-10-CM | POA: Diagnosis not present

## 2018-08-15 DIAGNOSIS — A419 Sepsis, unspecified organism: Secondary | ICD-10-CM | POA: Diagnosis not present

## 2018-08-15 DIAGNOSIS — L89152 Pressure ulcer of sacral region, stage 2: Secondary | ICD-10-CM | POA: Diagnosis not present

## 2018-08-15 DIAGNOSIS — I10 Essential (primary) hypertension: Secondary | ICD-10-CM | POA: Diagnosis not present

## 2018-08-15 DIAGNOSIS — Q039 Congenital hydrocephalus, unspecified: Secondary | ICD-10-CM | POA: Diagnosis not present

## 2018-08-15 DIAGNOSIS — Z4800 Encounter for change or removal of nonsurgical wound dressing: Secondary | ICD-10-CM | POA: Diagnosis not present

## 2018-08-15 DIAGNOSIS — K5909 Other constipation: Secondary | ICD-10-CM | POA: Diagnosis not present

## 2018-08-15 DIAGNOSIS — L03312 Cellulitis of back [any part except buttock]: Secondary | ICD-10-CM | POA: Diagnosis not present

## 2018-08-15 DIAGNOSIS — B966 Bacteroides fragilis [B. fragilis] as the cause of diseases classified elsewhere: Secondary | ICD-10-CM | POA: Diagnosis not present

## 2018-08-15 DIAGNOSIS — L02212 Cutaneous abscess of back [any part, except buttock]: Secondary | ICD-10-CM | POA: Diagnosis not present

## 2018-08-15 DIAGNOSIS — E1149 Type 2 diabetes mellitus with other diabetic neurological complication: Secondary | ICD-10-CM | POA: Diagnosis not present

## 2018-08-16 ENCOUNTER — Other Ambulatory Visit: Payer: Self-pay

## 2018-08-16 ENCOUNTER — Telehealth (INDEPENDENT_AMBULATORY_CARE_PROVIDER_SITE_OTHER): Payer: Medicare Other | Admitting: Family Medicine

## 2018-08-16 ENCOUNTER — Encounter: Payer: Self-pay | Admitting: Family Medicine

## 2018-08-16 VITALS — BP 120/78

## 2018-08-16 DIAGNOSIS — Z09 Encounter for follow-up examination after completed treatment for conditions other than malignant neoplasm: Secondary | ICD-10-CM | POA: Diagnosis not present

## 2018-08-17 DIAGNOSIS — L89152 Pressure ulcer of sacral region, stage 2: Secondary | ICD-10-CM

## 2018-08-17 DIAGNOSIS — L03312 Cellulitis of back [any part except buttock]: Secondary | ICD-10-CM | POA: Diagnosis not present

## 2018-08-17 DIAGNOSIS — B966 Bacteroides fragilis [B. fragilis] as the cause of diseases classified elsewhere: Secondary | ICD-10-CM | POA: Diagnosis not present

## 2018-08-17 DIAGNOSIS — Z4800 Encounter for change or removal of nonsurgical wound dressing: Secondary | ICD-10-CM | POA: Diagnosis not present

## 2018-08-17 DIAGNOSIS — L02212 Cutaneous abscess of back [any part, except buttock]: Secondary | ICD-10-CM | POA: Diagnosis not present

## 2018-08-17 DIAGNOSIS — E1149 Type 2 diabetes mellitus with other diabetic neurological complication: Secondary | ICD-10-CM

## 2018-08-17 DIAGNOSIS — K5909 Other constipation: Secondary | ICD-10-CM | POA: Diagnosis not present

## 2018-08-17 DIAGNOSIS — Q039 Congenital hydrocephalus, unspecified: Secondary | ICD-10-CM

## 2018-08-17 DIAGNOSIS — I10 Essential (primary) hypertension: Secondary | ICD-10-CM | POA: Diagnosis not present

## 2018-08-18 ENCOUNTER — Encounter: Payer: Self-pay | Admitting: General Surgery

## 2018-08-18 ENCOUNTER — Other Ambulatory Visit: Payer: Self-pay

## 2018-08-18 ENCOUNTER — Ambulatory Visit (INDEPENDENT_AMBULATORY_CARE_PROVIDER_SITE_OTHER): Payer: Medicare Other | Admitting: General Surgery

## 2018-08-18 ENCOUNTER — Ambulatory Visit: Payer: Medicare Other | Admitting: General Surgery

## 2018-08-18 VITALS — BP 81/57 | HR 83 | Temp 96.9°F | Resp 16 | Ht 62.0 in | Wt 170.0 lb

## 2018-08-18 DIAGNOSIS — L89152 Pressure ulcer of sacral region, stage 2: Secondary | ICD-10-CM | POA: Diagnosis not present

## 2018-08-18 DIAGNOSIS — L89103 Pressure ulcer of unspecified part of back, stage 3: Secondary | ICD-10-CM | POA: Diagnosis not present

## 2018-08-18 NOTE — Patient Instructions (Signed)
Continue negative pressure wound vacuum changes with home health three times a week. Continue treatment per the home health RN for sacral decubitus, keep padded dressing on area.  Continue frequent turns. Airbed. Protein supplements/ Ensure as tolerated.

## 2018-08-18 NOTE — Progress Notes (Signed)
[facility]   HPI:  58 y.o. Female presents to clinic for follow-up evaluation of her left back wound after debridement and wound vacuum placement. She has been having home health RN come to change the wound vacuum. She is doing great with these changes.  Review of Systems:  Wound improving, no drainage reported Sacral wound noted by home health RN  All other review of systems: otherwise negative   Vital Signs:  BP (!) 81/57 (BP Location: Left Arm, Patient Position: Sitting, Cuff Size: Normal)   Pulse 83   Temp (!) 96.9 F (36.1 C) (Tympanic)   Resp 16   Ht 5\' 2"  (1.575 m)   Wt 170 lb (77.1 kg)   SpO2 96%   BMI 31.09 kg/m    Physical Exam:  Physical Exam Vitals signs reviewed.  Cardiovascular:     Rate and Rhythm: Normal rate.  Pulmonary:     Effort: Pulmonary effort is normal.  Genitourinary:    Comments: 2cm size, sacral decubitus with fat exposed, stage 2, no erythema or drainage, firm Musculoskeletal:     Comments: Left back wound with granulation, shrinking, minimal undermining, minimal fibrinous tissue, minor sharp debridement performed, negative pressure wound vacuum replaced   Neurological:     Mental Status: She is alert.        Assessment:  58 y.o. yo Female with left decubitus back ulcer doing well with a wound vacuum changes. Sacral decubitus has gotten a little deeper than at the hospital. Overall doing great.  Plan:  Continue negative pressure wound vacuum changes with home health three times a week. Continue treatment per the home health RN for sacral decubitus, keep padded dressing on area.  Continue frequent turns. Airbed. Protein supplements/ Ensure as tolerated.  Future Appointments  Date Time Provider [place]  09/15/2018 10:40 AM Fayrene Helper, MD [facility]  09/20/2018 11:30 AM Virl Cagey, MD RS-RS None  10/27/2018  1:00 PM Perlie Mayo, NP R[facility]     All of the above recommendations  were discussed with the patient and patient's family, and all of patient's and family's questions were answered to their expressed satisfaction.   I spent over 40 minutes with the patient and her family changing her wound vacuum, looking at the wounds, and discussing the plan.   Curlene Labrum, MD [facility] [address] [place], [place] [address] [phone] (office)

## 2018-08-19 DIAGNOSIS — L89152 Pressure ulcer of sacral region, stage 2: Secondary | ICD-10-CM | POA: Diagnosis not present

## 2018-08-19 DIAGNOSIS — Z4800 Encounter for change or removal of nonsurgical wound dressing: Secondary | ICD-10-CM | POA: Diagnosis not present

## 2018-08-19 DIAGNOSIS — L02212 Cutaneous abscess of back [any part, except buttock]: Secondary | ICD-10-CM | POA: Diagnosis not present

## 2018-08-19 DIAGNOSIS — A419 Sepsis, unspecified organism: Secondary | ICD-10-CM | POA: Diagnosis not present

## 2018-08-19 DIAGNOSIS — B966 Bacteroides fragilis [B. fragilis] as the cause of diseases classified elsewhere: Secondary | ICD-10-CM | POA: Diagnosis not present

## 2018-08-19 DIAGNOSIS — L03312 Cellulitis of back [any part except buttock]: Secondary | ICD-10-CM | POA: Diagnosis not present

## 2018-08-20 NOTE — Progress Notes (Signed)
Virtual Visit via Telephone Note  I connected with April Watson on 08/20/18 at  1:40 PM EDT by telephone and verified that I am speaking with the correct person using two identifiers.  Location: Patient: home Provider: office   I discussed the limitations, risks, security and privacy concerns of performing an evaluation and management service by telephone and the availability of in person appointments. I also discussed with the patient that there may be a patient responsible charge related to this service. The patient expressed understanding and agreed to proceed.   History of Present Illness: Follow up recent hospitalization from 07/15 to 08/07/2018 , major dx at discharge stage 3 ulcer on back, sacral ulcer limited to skin breakdown, sepsois due to cellulitis,pseudo obstruction of colon Noted to be doing better with food intake, had a single episode of drooling which has since resolved. Concerned about slow progress of healing of ulcers ,  However thankful that surgery is involved No fever since home Had been constipated, now having loose stool, will alterante appropriately whether lomotil or stool softener and/ ot laxative is used  Denies fever or chills. Denies sinus pressure, nasal congestion, Denies chest congesting. Denies productive cough or wheeziitations and leg swelling Denies abdominal pain, nausea, vomiting,diarrhea or constipation.  Chronic  incontinence. Chronic  limitation in mobility.quadriperetic Denies headaches, seizures, numbness, or tingling. Denies depression, anxiety or insomnia.      Observations/Objective: BP 120/78  Appears more interactive on video conference Assessment and Plan:  Hospital discharge follow-up Patient in for follow up of recent hospitalization. Discharge summary, and laboratory and radiology data are reviewed, and any questions or concerns about recent hospitalization are discussed. Specific issues requiring follow up are specifically  addressed.    Follow Up Instructions:    I discussed the assessment and treatment plan with the patient. The patient was provided an opportunity to ask questions and all were answered. The patient agreed with the plan and demonstrated an understanding of the instructions.   The patient was advised to call back or seek an in-person evaluation if the symptoms worsen or if the condition fails to improve as anticipated.  I provided 22 minutes of non-face-to-face time during this encounter.   Tula Nakayama, MD

## 2018-08-20 NOTE — Patient Instructions (Addendum)
F/U end August as before, call if you need me somner  Recent lab shows that you do need to increase potassium to 3 daily  I am requesting CBC and diff , chem 7 and EGFR and phenobarb level  to be drawn 5 days before next visit, this does not need to be fasting  Please do continue to eat small amounts of food often to help with healing of your ulcers

## 2018-08-21 ENCOUNTER — Telehealth: Payer: Self-pay | Admitting: Family Medicine

## 2018-08-21 ENCOUNTER — Encounter: Payer: Self-pay | Admitting: Family Medicine

## 2018-08-21 MED ORDER — POTASSIUM CHLORIDE ER 10 MEQ PO TBCR: EXTENDED_RELEASE_TABLET | ORAL | 3 refills | Status: DC

## 2018-08-21 NOTE — Assessment & Plan Note (Signed)
Patient in for follow up of recent hospitalization. Discharge summary, and laboratory and radiology data are reviewed, and any questions or concerns about recent hospitalization are discussed. Specific issues requiring follow up are specifically addressed.  

## 2018-08-21 NOTE — Telephone Encounter (Signed)
pls order non fast labs from h/h to be drawn week of 8/20, cbc and diff, chem 7 and EGFr and pheobarb level, let [name] know

## 2018-08-22 ENCOUNTER — Telehealth: Payer: Self-pay

## 2018-08-22 DIAGNOSIS — L89152 Pressure ulcer of sacral region, stage 2: Secondary | ICD-10-CM | POA: Diagnosis not present

## 2018-08-22 DIAGNOSIS — A419 Sepsis, unspecified organism: Secondary | ICD-10-CM | POA: Diagnosis not present

## 2018-08-22 DIAGNOSIS — B966 Bacteroides fragilis [B. fragilis] as the cause of diseases classified elsewhere: Secondary | ICD-10-CM | POA: Diagnosis not present

## 2018-08-22 DIAGNOSIS — L02212 Cutaneous abscess of back [any part, except buttock]: Secondary | ICD-10-CM | POA: Diagnosis not present

## 2018-08-22 DIAGNOSIS — L03312 Cellulitis of back [any part except buttock]: Secondary | ICD-10-CM | POA: Diagnosis not present

## 2018-08-22 DIAGNOSIS — G40909 Epilepsy, unspecified, not intractable, without status epilepticus: Secondary | ICD-10-CM

## 2018-08-22 DIAGNOSIS — L89103 Pressure ulcer of unspecified part of back, stage 3: Secondary | ICD-10-CM

## 2018-08-22 DIAGNOSIS — Z4800 Encounter for change or removal of nonsurgical wound dressing: Secondary | ICD-10-CM | POA: Diagnosis not present

## 2018-08-22 NOTE — Telephone Encounter (Signed)
Entered labs for home health to draw on 09/08/2018 per MD orders

## 2018-08-22 NOTE — Telephone Encounter (Signed)
Ordered labs to be drawn by h/h and mailed to patients home

## 2018-08-24 DIAGNOSIS — E876 Hypokalemia: Secondary | ICD-10-CM

## 2018-08-24 DIAGNOSIS — G40909 Epilepsy, unspecified, not intractable, without status epilepticus: Secondary | ICD-10-CM | POA: Diagnosis not present

## 2018-08-24 DIAGNOSIS — D128 Benign neoplasm of rectum: Secondary | ICD-10-CM

## 2018-08-24 DIAGNOSIS — M6284 Sarcopenia: Secondary | ICD-10-CM

## 2018-08-24 DIAGNOSIS — L89143 Pressure ulcer of left lower back, stage 3: Secondary | ICD-10-CM | POA: Diagnosis not present

## 2018-08-24 DIAGNOSIS — L03312 Cellulitis of back [any part except buttock]: Secondary | ICD-10-CM | POA: Diagnosis not present

## 2018-08-24 DIAGNOSIS — F79 Unspecified intellectual disabilities: Secondary | ICD-10-CM

## 2018-08-24 DIAGNOSIS — Z4800 Encounter for change or removal of nonsurgical wound dressing: Secondary | ICD-10-CM | POA: Diagnosis not present

## 2018-08-24 DIAGNOSIS — M4185 Other forms of scoliosis, thoracolumbar region: Secondary | ICD-10-CM

## 2018-08-24 DIAGNOSIS — L02212 Cutaneous abscess of back [any part, except buttock]: Secondary | ICD-10-CM | POA: Diagnosis not present

## 2018-08-24 DIAGNOSIS — Z993 Dependence on wheelchair: Secondary | ICD-10-CM

## 2018-08-24 DIAGNOSIS — K8020 Calculus of gallbladder without cholecystitis without obstruction: Secondary | ICD-10-CM

## 2018-08-24 DIAGNOSIS — Z9181 History of falling: Secondary | ICD-10-CM

## 2018-08-24 DIAGNOSIS — E785 Hyperlipidemia, unspecified: Secondary | ICD-10-CM

## 2018-08-24 DIAGNOSIS — Z1159 Encounter for screening for other viral diseases: Secondary | ICD-10-CM

## 2018-08-24 DIAGNOSIS — I10 Essential (primary) hypertension: Secondary | ICD-10-CM | POA: Diagnosis not present

## 2018-08-24 DIAGNOSIS — D72829 Elevated white blood cell count, unspecified: Secondary | ICD-10-CM

## 2018-08-24 DIAGNOSIS — K5909 Other constipation: Secondary | ICD-10-CM

## 2018-08-24 DIAGNOSIS — M47819 Spondylosis without myelopathy or radiculopathy, site unspecified: Secondary | ICD-10-CM

## 2018-08-24 DIAGNOSIS — L89152 Pressure ulcer of sacral region, stage 2: Secondary | ICD-10-CM

## 2018-08-24 DIAGNOSIS — Q039 Congenital hydrocephalus, unspecified: Secondary | ICD-10-CM | POA: Diagnosis not present

## 2018-08-24 DIAGNOSIS — J9811 Atelectasis: Secondary | ICD-10-CM

## 2018-08-24 DIAGNOSIS — I051 Rheumatic mitral insufficiency: Secondary | ICD-10-CM

## 2018-08-24 DIAGNOSIS — B966 Bacteroides fragilis [B. fragilis] as the cause of diseases classified elsewhere: Secondary | ICD-10-CM | POA: Diagnosis not present

## 2018-08-24 DIAGNOSIS — G8250 Quadriplegia, unspecified: Secondary | ICD-10-CM | POA: Diagnosis not present

## 2018-08-24 DIAGNOSIS — E1149 Type 2 diabetes mellitus with other diabetic neurological complication: Secondary | ICD-10-CM

## 2018-08-24 DIAGNOSIS — K598 Other specified functional intestinal disorders: Secondary | ICD-10-CM

## 2018-08-24 DIAGNOSIS — A419 Sepsis, unspecified organism: Secondary | ICD-10-CM | POA: Diagnosis not present

## 2018-08-24 DIAGNOSIS — K219 Gastro-esophageal reflux disease without esophagitis: Secondary | ICD-10-CM

## 2018-08-25 ENCOUNTER — Telehealth: Payer: Self-pay | Admitting: *Deleted

## 2018-08-25 NOTE — Telephone Encounter (Signed)
Called [name] back it was in reference to the blood work orders. Checked chart abby has already taken care of this and had them ordered and sent a copy to pts home.

## 2018-08-25 NOTE — Telephone Encounter (Signed)
I'm not sure what paperwork she is referring to. Can you please check and let me know? Thank you

## 2018-08-25 NOTE — Telephone Encounter (Signed)
April Watson (on [place]) called said she needed to let the nurse know to fax the paperwork that was discussed to [facility] home health and also mail her a copy.

## 2018-08-26 DIAGNOSIS — Z4800 Encounter for change or removal of nonsurgical wound dressing: Secondary | ICD-10-CM | POA: Diagnosis not present

## 2018-08-26 DIAGNOSIS — A419 Sepsis, unspecified organism: Secondary | ICD-10-CM | POA: Diagnosis not present

## 2018-08-26 DIAGNOSIS — L02212 Cutaneous abscess of back [any part, except buttock]: Secondary | ICD-10-CM | POA: Diagnosis not present

## 2018-08-26 DIAGNOSIS — L03312 Cellulitis of back [any part except buttock]: Secondary | ICD-10-CM | POA: Diagnosis not present

## 2018-08-26 DIAGNOSIS — L89152 Pressure ulcer of sacral region, stage 2: Secondary | ICD-10-CM | POA: Diagnosis not present

## 2018-08-26 DIAGNOSIS — B966 Bacteroides fragilis [B. fragilis] as the cause of diseases classified elsewhere: Secondary | ICD-10-CM | POA: Diagnosis not present

## 2018-08-28 DIAGNOSIS — L89143 Pressure ulcer of left lower back, stage 3: Secondary | ICD-10-CM | POA: Diagnosis not present

## 2018-08-28 DIAGNOSIS — E785 Hyperlipidemia, unspecified: Secondary | ICD-10-CM | POA: Diagnosis not present

## 2018-08-28 DIAGNOSIS — Z4800 Encounter for change or removal of nonsurgical wound dressing: Secondary | ICD-10-CM | POA: Diagnosis not present

## 2018-08-28 DIAGNOSIS — G8250 Quadriplegia, unspecified: Secondary | ICD-10-CM | POA: Diagnosis not present

## 2018-08-28 DIAGNOSIS — M6284 Sarcopenia: Secondary | ICD-10-CM | POA: Diagnosis not present

## 2018-08-28 DIAGNOSIS — J9811 Atelectasis: Secondary | ICD-10-CM | POA: Diagnosis not present

## 2018-08-28 DIAGNOSIS — E876 Hypokalemia: Secondary | ICD-10-CM | POA: Diagnosis not present

## 2018-08-28 DIAGNOSIS — D128 Benign neoplasm of rectum: Secondary | ICD-10-CM | POA: Diagnosis not present

## 2018-08-28 DIAGNOSIS — K8020 Calculus of gallbladder without cholecystitis without obstruction: Secondary | ICD-10-CM | POA: Diagnosis not present

## 2018-08-28 DIAGNOSIS — G40909 Epilepsy, unspecified, not intractable, without status epilepticus: Secondary | ICD-10-CM | POA: Diagnosis not present

## 2018-08-28 DIAGNOSIS — K5909 Other constipation: Secondary | ICD-10-CM | POA: Diagnosis not present

## 2018-08-28 DIAGNOSIS — M4185 Other forms of scoliosis, thoracolumbar region: Secondary | ICD-10-CM | POA: Diagnosis not present

## 2018-08-28 DIAGNOSIS — K598 Other specified functional intestinal disorders: Secondary | ICD-10-CM | POA: Diagnosis not present

## 2018-08-28 DIAGNOSIS — I10 Essential (primary) hypertension: Secondary | ICD-10-CM | POA: Diagnosis not present

## 2018-08-28 DIAGNOSIS — I051 Rheumatic mitral insufficiency: Secondary | ICD-10-CM | POA: Diagnosis not present

## 2018-08-28 DIAGNOSIS — Q039 Congenital hydrocephalus, unspecified: Secondary | ICD-10-CM | POA: Diagnosis not present

## 2018-08-28 DIAGNOSIS — L02212 Cutaneous abscess of back [any part, except buttock]: Secondary | ICD-10-CM | POA: Diagnosis not present

## 2018-08-28 DIAGNOSIS — L89152 Pressure ulcer of sacral region, stage 2: Secondary | ICD-10-CM | POA: Diagnosis not present

## 2018-08-28 DIAGNOSIS — D72829 Elevated white blood cell count, unspecified: Secondary | ICD-10-CM | POA: Diagnosis not present

## 2018-08-28 DIAGNOSIS — B966 Bacteroides fragilis [B. fragilis] as the cause of diseases classified elsewhere: Secondary | ICD-10-CM | POA: Diagnosis not present

## 2018-08-28 DIAGNOSIS — M47819 Spondylosis without myelopathy or radiculopathy, site unspecified: Secondary | ICD-10-CM | POA: Diagnosis not present

## 2018-08-28 DIAGNOSIS — E1149 Type 2 diabetes mellitus with other diabetic neurological complication: Secondary | ICD-10-CM | POA: Diagnosis not present

## 2018-08-28 DIAGNOSIS — S70312D Abrasion, left thigh, subsequent encounter: Secondary | ICD-10-CM | POA: Diagnosis not present

## 2018-08-28 DIAGNOSIS — L03312 Cellulitis of back [any part except buttock]: Secondary | ICD-10-CM | POA: Diagnosis not present

## 2018-08-28 DIAGNOSIS — F79 Unspecified intellectual disabilities: Secondary | ICD-10-CM | POA: Diagnosis not present

## 2018-08-29 DIAGNOSIS — B966 Bacteroides fragilis [B. fragilis] as the cause of diseases classified elsewhere: Secondary | ICD-10-CM | POA: Diagnosis not present

## 2018-08-29 DIAGNOSIS — Z4800 Encounter for change or removal of nonsurgical wound dressing: Secondary | ICD-10-CM | POA: Diagnosis not present

## 2018-08-29 DIAGNOSIS — L02212 Cutaneous abscess of back [any part, except buttock]: Secondary | ICD-10-CM | POA: Diagnosis not present

## 2018-08-29 DIAGNOSIS — L89152 Pressure ulcer of sacral region, stage 2: Secondary | ICD-10-CM | POA: Diagnosis not present

## 2018-08-29 DIAGNOSIS — L89143 Pressure ulcer of left lower back, stage 3: Secondary | ICD-10-CM | POA: Diagnosis not present

## 2018-08-29 DIAGNOSIS — L03312 Cellulitis of back [any part except buttock]: Secondary | ICD-10-CM | POA: Diagnosis not present

## 2018-08-30 ENCOUNTER — Other Ambulatory Visit: Payer: Self-pay | Admitting: Family Medicine

## 2018-08-30 ENCOUNTER — Encounter: Payer: Self-pay | Admitting: Family Medicine

## 2018-08-30 DIAGNOSIS — Z1159 Encounter for screening for other viral diseases: Secondary | ICD-10-CM

## 2018-08-30 DIAGNOSIS — B966 Bacteroides fragilis [B. fragilis] as the cause of diseases classified elsewhere: Secondary | ICD-10-CM | POA: Diagnosis not present

## 2018-08-30 DIAGNOSIS — I10 Essential (primary) hypertension: Secondary | ICD-10-CM

## 2018-08-30 DIAGNOSIS — D128 Benign neoplasm of rectum: Secondary | ICD-10-CM

## 2018-08-30 DIAGNOSIS — E785 Hyperlipidemia, unspecified: Secondary | ICD-10-CM

## 2018-08-30 DIAGNOSIS — I051 Rheumatic mitral insufficiency: Secondary | ICD-10-CM

## 2018-08-30 DIAGNOSIS — M6284 Sarcopenia: Secondary | ICD-10-CM

## 2018-08-30 DIAGNOSIS — G40909 Epilepsy, unspecified, not intractable, without status epilepticus: Secondary | ICD-10-CM

## 2018-08-30 DIAGNOSIS — Z4800 Encounter for change or removal of nonsurgical wound dressing: Secondary | ICD-10-CM | POA: Diagnosis not present

## 2018-08-30 DIAGNOSIS — K5909 Other constipation: Secondary | ICD-10-CM | POA: Diagnosis not present

## 2018-08-30 DIAGNOSIS — Z993 Dependence on wheelchair: Secondary | ICD-10-CM

## 2018-08-30 DIAGNOSIS — L02212 Cutaneous abscess of back [any part, except buttock]: Secondary | ICD-10-CM | POA: Diagnosis not present

## 2018-08-30 DIAGNOSIS — D72829 Elevated white blood cell count, unspecified: Secondary | ICD-10-CM

## 2018-08-30 DIAGNOSIS — M47819 Spondylosis without myelopathy or radiculopathy, site unspecified: Secondary | ICD-10-CM

## 2018-08-30 DIAGNOSIS — M4185 Other forms of scoliosis, thoracolumbar region: Secondary | ICD-10-CM

## 2018-08-30 DIAGNOSIS — K219 Gastro-esophageal reflux disease without esophagitis: Secondary | ICD-10-CM

## 2018-08-30 DIAGNOSIS — L89152 Pressure ulcer of sacral region, stage 2: Secondary | ICD-10-CM

## 2018-08-30 DIAGNOSIS — J9811 Atelectasis: Secondary | ICD-10-CM

## 2018-08-30 DIAGNOSIS — E1149 Type 2 diabetes mellitus with other diabetic neurological complication: Secondary | ICD-10-CM

## 2018-08-30 DIAGNOSIS — G8250 Quadriplegia, unspecified: Secondary | ICD-10-CM

## 2018-08-30 DIAGNOSIS — K8020 Calculus of gallbladder without cholecystitis without obstruction: Secondary | ICD-10-CM

## 2018-08-30 DIAGNOSIS — K598 Other specified functional intestinal disorders: Secondary | ICD-10-CM

## 2018-08-30 DIAGNOSIS — Z9181 History of falling: Secondary | ICD-10-CM

## 2018-08-30 DIAGNOSIS — E876 Hypokalemia: Secondary | ICD-10-CM

## 2018-08-30 DIAGNOSIS — Q039 Congenital hydrocephalus, unspecified: Secondary | ICD-10-CM | POA: Diagnosis not present

## 2018-08-30 DIAGNOSIS — F79 Unspecified intellectual disabilities: Secondary | ICD-10-CM

## 2018-08-30 DIAGNOSIS — L03312 Cellulitis of back [any part except buttock]: Secondary | ICD-10-CM | POA: Diagnosis not present

## 2018-08-30 MED ORDER — PANTOPRAZOLE SODIUM 40 MG PO TBEC: 40.0000 mg | DELAYED_RELEASE_TABLET | Freq: Every day | ORAL | 3 refills | Status: DC

## 2018-08-30 MED ORDER — LINACLOTIDE 145 MCG PO CAPS: 145.0000 ug | ORAL_CAPSULE | Freq: Every day | ORAL | 3 refills | Status: DC

## 2018-08-31 ENCOUNTER — Telehealth: Payer: Self-pay | Admitting: *Deleted

## 2018-08-31 DIAGNOSIS — L89143 Pressure ulcer of left lower back, stage 3: Secondary | ICD-10-CM | POA: Diagnosis not present

## 2018-08-31 DIAGNOSIS — B966 Bacteroides fragilis [B. fragilis] as the cause of diseases classified elsewhere: Secondary | ICD-10-CM | POA: Diagnosis not present

## 2018-08-31 DIAGNOSIS — L89152 Pressure ulcer of sacral region, stage 2: Secondary | ICD-10-CM | POA: Diagnosis not present

## 2018-08-31 DIAGNOSIS — L02212 Cutaneous abscess of back [any part, except buttock]: Secondary | ICD-10-CM | POA: Diagnosis not present

## 2018-08-31 DIAGNOSIS — L03312 Cellulitis of back [any part except buttock]: Secondary | ICD-10-CM | POA: Diagnosis not present

## 2018-08-31 DIAGNOSIS — Z4800 Encounter for change or removal of nonsurgical wound dressing: Secondary | ICD-10-CM | POA: Diagnosis not present

## 2018-08-31 NOTE — Telephone Encounter (Signed)
PLS JUST HAVE THEM ALL DRAWN NOW AT THE SAME TIME AND NOT ON ORIGINAL ORDER, THANK YOU

## 2018-08-31 NOTE — Telephone Encounter (Signed)
Is this ok with you  ?

## 2018-08-31 NOTE — Telephone Encounter (Signed)
[name] with [name] was in to see April Watson. Stated she was running a fever of 99.5 - 100.5 wanted to know if she should do a urine specimen while she was out. I asked Velna Hatchet and she said yes. Let her know that it would be ok.

## 2018-08-31 NOTE — Telephone Encounter (Signed)
Yes also needs CBc and diff ( should have been ordered before)

## 2018-08-31 NOTE — Telephone Encounter (Signed)
The cbc as well as bmp and phentobarb level were sent to them to be drawn on 8/20. Do you want them to draw them now or just the cbc/diff?

## 2018-09-02 ENCOUNTER — Telehealth: Payer: Self-pay | Admitting: *Deleted

## 2018-09-02 DIAGNOSIS — L03312 Cellulitis of back [any part except buttock]: Secondary | ICD-10-CM | POA: Diagnosis not present

## 2018-09-02 DIAGNOSIS — L02212 Cutaneous abscess of back [any part, except buttock]: Secondary | ICD-10-CM | POA: Diagnosis not present

## 2018-09-02 DIAGNOSIS — Z4800 Encounter for change or removal of nonsurgical wound dressing: Secondary | ICD-10-CM | POA: Diagnosis not present

## 2018-09-02 DIAGNOSIS — L89152 Pressure ulcer of sacral region, stage 2: Secondary | ICD-10-CM | POA: Diagnosis not present

## 2018-09-02 DIAGNOSIS — B966 Bacteroides fragilis [B. fragilis] as the cause of diseases classified elsewhere: Secondary | ICD-10-CM | POA: Diagnosis not present

## 2018-09-02 DIAGNOSIS — L89143 Pressure ulcer of left lower back, stage 3: Secondary | ICD-10-CM | POA: Diagnosis not present

## 2018-09-02 DIAGNOSIS — N390 Urinary tract infection, site not specified: Secondary | ICD-10-CM | POA: Diagnosis not present

## 2018-09-02 NOTE — Telephone Encounter (Signed)
[name] with [facility] called just wanted to let Dr.Simpson know that she was unable to get Dianes blood drawn today but she would draw it when she went out on Monday

## 2018-09-02 NOTE — Telephone Encounter (Signed)
noted 

## 2018-09-02 NOTE — Telephone Encounter (Signed)
FYI

## 2018-09-05 ENCOUNTER — Telehealth: Payer: Self-pay | Admitting: *Deleted

## 2018-09-05 ENCOUNTER — Encounter: Payer: Self-pay | Admitting: Family Medicine

## 2018-09-05 ENCOUNTER — Telehealth: Payer: Self-pay

## 2018-09-05 DIAGNOSIS — L03312 Cellulitis of back [any part except buttock]: Secondary | ICD-10-CM | POA: Diagnosis not present

## 2018-09-05 DIAGNOSIS — B966 Bacteroides fragilis [B. fragilis] as the cause of diseases classified elsewhere: Secondary | ICD-10-CM | POA: Diagnosis not present

## 2018-09-05 DIAGNOSIS — L89152 Pressure ulcer of sacral region, stage 2: Secondary | ICD-10-CM | POA: Diagnosis not present

## 2018-09-05 DIAGNOSIS — I10 Essential (primary) hypertension: Secondary | ICD-10-CM

## 2018-09-05 DIAGNOSIS — E785 Hyperlipidemia, unspecified: Secondary | ICD-10-CM

## 2018-09-05 DIAGNOSIS — L89143 Pressure ulcer of left lower back, stage 3: Secondary | ICD-10-CM | POA: Diagnosis not present

## 2018-09-05 DIAGNOSIS — L02212 Cutaneous abscess of back [any part, except buttock]: Secondary | ICD-10-CM | POA: Diagnosis not present

## 2018-09-05 DIAGNOSIS — R569 Unspecified convulsions: Secondary | ICD-10-CM

## 2018-09-05 DIAGNOSIS — Z4800 Encounter for change or removal of nonsurgical wound dressing: Secondary | ICD-10-CM | POA: Diagnosis not present

## 2018-09-05 NOTE — Telephone Encounter (Signed)
Orders sent to [facility] to do all this week instead of 8/20 as previously ordered

## 2018-09-05 NOTE — Telephone Encounter (Signed)
Spoke with family on [facility] and changed the order to [facility] and family member that works there will try to draw her blood

## 2018-09-05 NOTE — Telephone Encounter (Signed)
Labs reordered through [facility]

## 2018-09-05 NOTE — Telephone Encounter (Signed)
May get [facility] drawn at [facility] day  She has f/u with surgeon which she needs to go in for, and that is in September, ( has appt scheduled next week with me but does not need to be in office)

## 2018-09-05 NOTE — Telephone Encounter (Signed)
April Watson with [facility] tried to get April Watson bloods again today but said she is unable to draw them she has unsuccessfully tried twice. Wanted to know if Dr. Moshe Cipro wanted to [facility] to [facility] to [facility] drawn?

## 2018-09-06 DIAGNOSIS — R569 Unspecified convulsions: Secondary | ICD-10-CM | POA: Diagnosis not present

## 2018-09-06 DIAGNOSIS — I10 Essential (primary) hypertension: Secondary | ICD-10-CM | POA: Diagnosis not present

## 2018-09-06 DIAGNOSIS — R7302 Impaired glucose tolerance (oral): Secondary | ICD-10-CM | POA: Diagnosis not present

## 2018-09-07 DIAGNOSIS — L03312 Cellulitis of back [any part except buttock]: Secondary | ICD-10-CM | POA: Diagnosis not present

## 2018-09-07 DIAGNOSIS — L02212 Cutaneous abscess of back [any part, except buttock]: Secondary | ICD-10-CM | POA: Diagnosis not present

## 2018-09-07 DIAGNOSIS — Z4800 Encounter for change or removal of nonsurgical wound dressing: Secondary | ICD-10-CM | POA: Diagnosis not present

## 2018-09-07 DIAGNOSIS — L89152 Pressure ulcer of sacral region, stage 2: Secondary | ICD-10-CM | POA: Diagnosis not present

## 2018-09-07 DIAGNOSIS — B966 Bacteroides fragilis [B. fragilis] as the cause of diseases classified elsewhere: Secondary | ICD-10-CM | POA: Diagnosis not present

## 2018-09-07 DIAGNOSIS — L89143 Pressure ulcer of left lower back, stage 3: Secondary | ICD-10-CM | POA: Diagnosis not present

## 2018-09-07 LAB — CBC WITH DIFFERENTIAL/PLATELET
Basophils Absolute: 0.1 10*3/uL (ref 0.0–0.2)
Basos: 0 %
EOS (ABSOLUTE): 0.4 10*3/uL (ref 0.0–0.4)
Eos: 3 %
Hematocrit: 35.2 % (ref 34.0–46.6)
Hemoglobin: 11.4 g/dL (ref 11.1–15.9)
Immature Grans (Abs): 0.1 10*3/uL (ref 0.0–0.1)
Immature Granulocytes: 1 %
Lymphocytes Absolute: 2.3 10*3/uL (ref 0.7–3.1)
Lymphs: 18 %
MCH: 28.7 pg (ref 26.6–33.0)
MCHC: 32.4 g/dL (ref 31.5–35.7)
MCV: 89 fL (ref 79–97)
Monocytes Absolute: 0.8 10*3/uL (ref 0.1–0.9)
Monocytes: 6 %
Neutrophils Absolute: 9 10*3/uL — ABNORMAL HIGH (ref 1.4–7.0)
Neutrophils: 72 %
Platelets: 457 10*3/uL — ABNORMAL HIGH (ref 150–450)
RBC: 3.97 x10E6/uL (ref 3.77–5.28)
RDW: 15 % (ref 11.7–15.4)
WBC: 12.5 10*3/uL — ABNORMAL HIGH (ref 3.4–10.8)

## 2018-09-07 LAB — BMP8+EGFR
BUN/Creatinine Ratio: 34 — ABNORMAL HIGH (ref 9–23)
BUN: 11 mg/dL (ref 6–24)
CO2: 25 mmol/L (ref 20–29)
Calcium: 8.4 mg/dL — ABNORMAL LOW (ref 8.7–10.2)
Chloride: 99 mmol/L (ref 96–106)
Creatinine, Ser: 0.32 mg/dL — ABNORMAL LOW (ref 0.57–1.00)
GFR calc Af Amer: 143 mL/min/{1.73_m2} (ref >59)
GFR calc non Af Amer: 124 mL/min/{1.73_m2} (ref >59)
Glucose: 175 mg/dL — ABNORMAL HIGH (ref 65–99)
Potassium: 4.5 mmol/L (ref 3.5–5.2)
Sodium: 134 mmol/L (ref 134–144)

## 2018-09-07 LAB — PHENOBARBITAL LEVEL: Phenobarbital, Serum: 21 ug/mL (ref 15–40)

## 2018-09-08 ENCOUNTER — Telehealth: Payer: Self-pay

## 2018-09-08 ENCOUNTER — Encounter: Payer: Self-pay | Admitting: Family Medicine

## 2018-09-08 DIAGNOSIS — R7302 Impaired glucose tolerance (oral): Secondary | ICD-10-CM

## 2018-09-08 NOTE — Telephone Encounter (Signed)
error 

## 2018-09-09 ENCOUNTER — Encounter: Payer: Self-pay | Admitting: Family Medicine

## 2018-09-09 DIAGNOSIS — L02212 Cutaneous abscess of back [any part, except buttock]: Secondary | ICD-10-CM | POA: Diagnosis not present

## 2018-09-09 DIAGNOSIS — L89143 Pressure ulcer of left lower back, stage 3: Secondary | ICD-10-CM | POA: Diagnosis not present

## 2018-09-09 DIAGNOSIS — Z4800 Encounter for change or removal of nonsurgical wound dressing: Secondary | ICD-10-CM | POA: Diagnosis not present

## 2018-09-09 DIAGNOSIS — B966 Bacteroides fragilis [B. fragilis] as the cause of diseases classified elsewhere: Secondary | ICD-10-CM | POA: Diagnosis not present

## 2018-09-09 DIAGNOSIS — L89152 Pressure ulcer of sacral region, stage 2: Secondary | ICD-10-CM | POA: Diagnosis not present

## 2018-09-09 DIAGNOSIS — L03312 Cellulitis of back [any part except buttock]: Secondary | ICD-10-CM | POA: Diagnosis not present

## 2018-09-12 DIAGNOSIS — Z4800 Encounter for change or removal of nonsurgical wound dressing: Secondary | ICD-10-CM | POA: Diagnosis not present

## 2018-09-12 DIAGNOSIS — L89143 Pressure ulcer of left lower back, stage 3: Secondary | ICD-10-CM | POA: Diagnosis not present

## 2018-09-12 DIAGNOSIS — L02212 Cutaneous abscess of back [any part, except buttock]: Secondary | ICD-10-CM | POA: Diagnosis not present

## 2018-09-12 DIAGNOSIS — L89152 Pressure ulcer of sacral region, stage 2: Secondary | ICD-10-CM | POA: Diagnosis not present

## 2018-09-12 DIAGNOSIS — B966 Bacteroides fragilis [B. fragilis] as the cause of diseases classified elsewhere: Secondary | ICD-10-CM | POA: Diagnosis not present

## 2018-09-12 DIAGNOSIS — L03312 Cellulitis of back [any part except buttock]: Secondary | ICD-10-CM | POA: Diagnosis not present

## 2018-09-13 DIAGNOSIS — Z4800 Encounter for change or removal of nonsurgical wound dressing: Secondary | ICD-10-CM | POA: Diagnosis not present

## 2018-09-13 DIAGNOSIS — L03312 Cellulitis of back [any part except buttock]: Secondary | ICD-10-CM | POA: Diagnosis not present

## 2018-09-13 DIAGNOSIS — B966 Bacteroides fragilis [B. fragilis] as the cause of diseases classified elsewhere: Secondary | ICD-10-CM | POA: Diagnosis not present

## 2018-09-13 DIAGNOSIS — K5909 Other constipation: Secondary | ICD-10-CM | POA: Diagnosis not present

## 2018-09-13 DIAGNOSIS — I10 Essential (primary) hypertension: Secondary | ICD-10-CM | POA: Diagnosis not present

## 2018-09-13 DIAGNOSIS — Q039 Congenital hydrocephalus, unspecified: Secondary | ICD-10-CM | POA: Diagnosis not present

## 2018-09-13 DIAGNOSIS — E1149 Type 2 diabetes mellitus with other diabetic neurological complication: Secondary | ICD-10-CM | POA: Diagnosis not present

## 2018-09-13 DIAGNOSIS — L02212 Cutaneous abscess of back [any part, except buttock]: Secondary | ICD-10-CM | POA: Diagnosis not present

## 2018-09-13 DIAGNOSIS — L89152 Pressure ulcer of sacral region, stage 2: Secondary | ICD-10-CM | POA: Diagnosis not present

## 2018-09-13 DIAGNOSIS — L89143 Pressure ulcer of left lower back, stage 3: Secondary | ICD-10-CM | POA: Diagnosis not present

## 2018-09-14 DIAGNOSIS — B966 Bacteroides fragilis [B. fragilis] as the cause of diseases classified elsewhere: Secondary | ICD-10-CM | POA: Diagnosis not present

## 2018-09-14 DIAGNOSIS — L89143 Pressure ulcer of left lower back, stage 3: Secondary | ICD-10-CM | POA: Diagnosis not present

## 2018-09-14 DIAGNOSIS — Z4800 Encounter for change or removal of nonsurgical wound dressing: Secondary | ICD-10-CM | POA: Diagnosis not present

## 2018-09-14 DIAGNOSIS — L02212 Cutaneous abscess of back [any part, except buttock]: Secondary | ICD-10-CM | POA: Diagnosis not present

## 2018-09-14 DIAGNOSIS — L03312 Cellulitis of back [any part except buttock]: Secondary | ICD-10-CM | POA: Diagnosis not present

## 2018-09-14 DIAGNOSIS — L89152 Pressure ulcer of sacral region, stage 2: Secondary | ICD-10-CM | POA: Diagnosis not present

## 2018-09-14 LAB — SPECIMEN STATUS REPORT

## 2018-09-14 LAB — HGB A1C W/O EAG: Hgb A1c MFr Bld: 5.4 % (ref 4.8–5.6)

## 2018-09-15 ENCOUNTER — Ambulatory Visit (INDEPENDENT_AMBULATORY_CARE_PROVIDER_SITE_OTHER): Payer: Medicare Other | Admitting: Family Medicine

## 2018-09-15 ENCOUNTER — Other Ambulatory Visit: Payer: Self-pay

## 2018-09-15 VITALS — BP 96/56 | HR 110 | Temp 97.9°F

## 2018-09-15 DIAGNOSIS — I959 Hypotension, unspecified: Secondary | ICD-10-CM | POA: Diagnosis not present

## 2018-09-15 DIAGNOSIS — E785 Hyperlipidemia, unspecified: Secondary | ICD-10-CM | POA: Diagnosis not present

## 2018-09-15 DIAGNOSIS — R140 Abdominal distension (gaseous): Secondary | ICD-10-CM

## 2018-09-15 DIAGNOSIS — E559 Vitamin D deficiency, unspecified: Secondary | ICD-10-CM | POA: Diagnosis not present

## 2018-09-15 DIAGNOSIS — R1314 Dysphagia, pharyngoesophageal phase: Secondary | ICD-10-CM

## 2018-09-15 DIAGNOSIS — Q398 Other congenital malformations of esophagus: Secondary | ICD-10-CM

## 2018-09-15 DIAGNOSIS — K5909 Other constipation: Secondary | ICD-10-CM | POA: Diagnosis not present

## 2018-09-15 DIAGNOSIS — R0989 Other specified symptoms and signs involving the circulatory and respiratory systems: Secondary | ICD-10-CM

## 2018-09-15 DIAGNOSIS — L89103 Pressure ulcer of unspecified part of back, stage 3: Secondary | ICD-10-CM

## 2018-09-15 DIAGNOSIS — E1149 Type 2 diabetes mellitus with other diabetic neurological complication: Secondary | ICD-10-CM | POA: Diagnosis not present

## 2018-09-15 DIAGNOSIS — R630 Anorexia: Secondary | ICD-10-CM

## 2018-09-15 DIAGNOSIS — L89322 Pressure ulcer of left buttock, stage 2: Secondary | ICD-10-CM | POA: Diagnosis not present

## 2018-09-15 DIAGNOSIS — R569 Unspecified convulsions: Secondary | ICD-10-CM | POA: Diagnosis not present

## 2018-09-15 MED ORDER — PHENOBARBITAL 32.4 MG PO TABS: 32.4000 mg | ORAL_TABLET | Freq: Two times a day (BID) | ORAL | 4 refills | Status: AC

## 2018-09-15 NOTE — Progress Notes (Addendum)
Virtual Visit via Telephone Note  I connected with April Watson on 09/15/18 at 10:40 AM EDT by telephone and verified that I am speaking with the correct person using two identifiers.  Location: Patient:home  Provider: office   I discussed the limitations, risks, security and privacy concerns of performing an evaluation and management service by telephone and the availability of in person appointments. I also discussed with the patient that there may be a patient responsible charge related to this service. The patient expressed understanding and agreed to proceed.   History of Present Illness:Family , sister and sister in law provide history  Nursing is pleased with ulcer healing, wound vac is draining less, and ulcer is reducing in size, states may have necrotic skin which  May need trimming Has surgery appt next week Pt not eating as before, however she is more interactive, sleeping less and more engaged, no fever  Denies recent fever or chills. Denies nasal congestion,  Denies chest congestion, productive cough or wheezing. Denies orthopnea  and leg swelling Denies abdominal pain,and distension, nausea, vomiting,diarrhea or constipation.   Denies malodorous or cloudy urine Chronic  limitation in mobility. C/O increased difficulty swallowing with recurrent episodes of choking. Increased need for suctioning due to weak swallow, and increased risk of aspiration, requests and needs suctioning device to assist with clearing secretions as patient is unable t do so based on her mental and physical conditions. She has had several episodes where 911 was called or she needed to be taken to the ED due to excessive choking    Observations/Objective: BP (!) 96/56   Pulse (!) 110   Temp 97.9 F (36.6 C) (Oral)   SpO2 96% checked ar t home by nurse   Follow Up Instructions: Abdominal distension Reportedly improvedwith improved appetite, and no emesis  Chronic constipation Responding  well to current medication regime  Hypotension H/o HTN, however currently hypotensive off of medicaion, will follow, level of interaction with family and environs deemed appropriate as is her urine output  Poor appetite Improving per family report  Decubitus ulcer of left ischial area, stage II ([place]) Managed by Surgery , reportedly healing  Decubitus ulcer of back, stage 3 ([place]) Wound vac in place and managed by surgery, h/h nurse3 times / week, drainage has lessened and there is skin closure taking place  per report  Convulsions Controlled on currentmedication  Difficulty in swallowing Recurrent episodes of choking on both solids and liquids due to weak muscles as well as cognitive  impairment, from cerebral palsy. Device for deep suctioning needed, to reduce aspiration and protect airway, as well as for patient comfort  Choking in adult Increased frequency and severity of choking episodes noted by family members, needing ED evaluation, deep suctioning equipment needed at home   Congenital esophageal dysmotility worseneing symptoms in terms of choking episodes needs deep suctioning device for in home use     I discussed the assessment and treatment plan with the patient. The patient was provided an opportunity to ask questions and all were answered. The patient agreed with the plan and demonstrated an understanding of the instructions.   The patient was advised to call back or seek an in-person evaluation if the symptoms worsen or if the condition fails to improve as anticipated.  I provided 22 minutes of non-face-to-face time during this encounter.   Tula Nakayama, MD

## 2018-09-15 NOTE — Patient Instructions (Signed)
F/U 2nd week in December, phone/ video visit   Call if you need me before  Flu vaccine to be fully arrnagerd Labs through [facility], first week in Dec, CBC and diff, cmp and eGFr, lipid, and Vit D level  Thankful much better  Thanks for choosing [facility], we consider it a privelige to serve you.

## 2018-09-16 DIAGNOSIS — L89152 Pressure ulcer of sacral region, stage 2: Secondary | ICD-10-CM | POA: Diagnosis not present

## 2018-09-16 DIAGNOSIS — L03312 Cellulitis of back [any part except buttock]: Secondary | ICD-10-CM | POA: Diagnosis not present

## 2018-09-16 DIAGNOSIS — L89143 Pressure ulcer of left lower back, stage 3: Secondary | ICD-10-CM | POA: Diagnosis not present

## 2018-09-16 DIAGNOSIS — Z4800 Encounter for change or removal of nonsurgical wound dressing: Secondary | ICD-10-CM | POA: Diagnosis not present

## 2018-09-16 DIAGNOSIS — B966 Bacteroides fragilis [B. fragilis] as the cause of diseases classified elsewhere: Secondary | ICD-10-CM | POA: Diagnosis not present

## 2018-09-16 DIAGNOSIS — L02212 Cutaneous abscess of back [any part, except buttock]: Secondary | ICD-10-CM | POA: Diagnosis not present

## 2018-09-19 ENCOUNTER — Encounter: Payer: Self-pay | Admitting: Family Medicine

## 2018-09-19 DIAGNOSIS — L89143 Pressure ulcer of left lower back, stage 3: Secondary | ICD-10-CM | POA: Diagnosis not present

## 2018-09-19 DIAGNOSIS — B966 Bacteroides fragilis [B. fragilis] as the cause of diseases classified elsewhere: Secondary | ICD-10-CM | POA: Diagnosis not present

## 2018-09-19 DIAGNOSIS — L89152 Pressure ulcer of sacral region, stage 2: Secondary | ICD-10-CM | POA: Diagnosis not present

## 2018-09-19 DIAGNOSIS — L03312 Cellulitis of back [any part except buttock]: Secondary | ICD-10-CM | POA: Diagnosis not present

## 2018-09-19 DIAGNOSIS — Z4800 Encounter for change or removal of nonsurgical wound dressing: Secondary | ICD-10-CM | POA: Diagnosis not present

## 2018-09-19 DIAGNOSIS — L02212 Cutaneous abscess of back [any part, except buttock]: Secondary | ICD-10-CM | POA: Diagnosis not present

## 2018-09-19 DIAGNOSIS — I959 Hypotension, unspecified: Secondary | ICD-10-CM | POA: Insufficient documentation

## 2018-09-19 NOTE — Assessment & Plan Note (Signed)
Managed by Surgery , reportedly healing

## 2018-09-19 NOTE — Assessment & Plan Note (Signed)
Controlled on current medication ?

## 2018-09-19 NOTE — Assessment & Plan Note (Signed)
H/o HTN, however currently hypotensive off of medicaion, will follow, level of interaction with family and environs deemed appropriate as is her urine output

## 2018-09-19 NOTE — Assessment & Plan Note (Signed)
Responding well to current medication regime

## 2018-09-19 NOTE — Assessment & Plan Note (Signed)
Reportedly improvedwith improved appetite, and no emesis

## 2018-09-19 NOTE — Assessment & Plan Note (Signed)
Improving per family report

## 2018-09-19 NOTE — Assessment & Plan Note (Signed)
Wound vac in place and managed by surgery, h/h nurse3 times / week, drainage has lessened and there is skin closure taking place  per report

## 2018-09-20 ENCOUNTER — Encounter: Payer: Self-pay | Admitting: General Surgery

## 2018-09-20 ENCOUNTER — Ambulatory Visit (INDEPENDENT_AMBULATORY_CARE_PROVIDER_SITE_OTHER): Payer: Medicare Other

## 2018-09-20 ENCOUNTER — Other Ambulatory Visit: Payer: Self-pay

## 2018-09-20 ENCOUNTER — Ambulatory Visit (INDEPENDENT_AMBULATORY_CARE_PROVIDER_SITE_OTHER): Payer: Medicare Other | Admitting: General Surgery

## 2018-09-20 VITALS — BP 107/75 | HR 137 | Temp 96.9°F | Resp 18 | Wt 170.0 lb

## 2018-09-20 DIAGNOSIS — L89103 Pressure ulcer of unspecified part of back, stage 3: Secondary | ICD-10-CM

## 2018-09-20 DIAGNOSIS — Z23 Encounter for immunization: Secondary | ICD-10-CM

## 2018-09-20 DIAGNOSIS — L89152 Pressure ulcer of sacral region, stage 2: Secondary | ICD-10-CM | POA: Diagnosis not present

## 2018-09-20 NOTE — Progress Notes (Signed)
[facility] Note   HPI:  58 y.o. Female presents to clinic for follow-up evaluation of her left back/ side pressure wound and sacral wound. We were notified by her home health RN that a rib was starting to show and white sponge was started under the black sponge. The patient's family reports she is otherwise fair but is not eating much. They say she is losing weight but due to inability to weigh her at home or in the office we cannot quantify this amount.   Review of Systems:  + weight loss + exposed rib in the pressure ulcer  All other review of systems: otherwise negative   Vital Signs:  BP 107/75 (BP Location: Left Arm, Patient Position: Sitting, Cuff Size: Normal)   Pulse (!) 137   Temp (!) 96.9 F (36.1 C) (Tympanic)   Resp 18   Wt 170 lb (77.1 kg)   SpO2 98%   BMI 31.09 kg/m    Physical Exam:  Physical Exam Cardiovascular:     Rate and Rhythm: Normal rate.  Pulmonary:     Effort: Pulmonary effort is normal.  Musculoskeletal:     Comments: Left back/ flank area, wound vacuum in place, removed, healthy skin edges, no undermining noted which is improved, rib in the central area of wound (one of the floating ribs), granulation surrounding the rib and minimal fibrinous material on the lower edge, fibrinous material removed with scissors, wound vacuum replaced with white sponge over rib and black sponge in the remaining bed, skin protected   Sacral wound small <2X1cm, no exposed bone or muscle   Neurological:     Mental Status: She is alert.        Assessment:  58 y.o. yo Female with a large pressure wound on her left back/ flank area. There is a small portion of bone that is exposed after her debridements and wound vacuum placement. This is a floating rib. Given the patient's weight loss, this is understandable as she is losing more subcutaneous fat.  I have warned the family repeatedly that this wound will likely never heal completely nor will her sacral  wound. It is more about getting them under control and preventing future wounds or worsening wounds.  She now has an airbed and special equipment at home to help the family.   Plan:  - Continue the wound vacuum for now with white sponge over rib and black sponge over the remaining area - Repeat wound check soon  - Encouraged boost/ ensure QID if able to help with protein stores and nutrition   Future Appointments  Date Time Provider [place]  10/04/2018 11:30 AM Virl Cagey, MD RS-RS None  10/27/2018  1:00 PM Perlie Mayo, NP [facility]  12/28/2018 10:20 AM Fayrene Helper, MD RPC-RPC R[facility]     All of the above recommendations were discussed with the patient and patient's family, and all of patient's and family's questions were answered to their expressed satisfaction.  Spent over 25 minutes during this encounter assessing the patient and the wound and discussing the plan with the family.   Curlene Labrum, MD [facility] [address] [place], [place] [address] [phone] (office)

## 2018-09-20 NOTE — Patient Instructions (Signed)
Continue wound vacuum changes to the left side and dressing changes to the sacral wound.  Keep an eye on the exposed bone. Call if concerns for more bone showing, or for signs of fever, cough, infection.

## 2018-09-23 DIAGNOSIS — Z4800 Encounter for change or removal of nonsurgical wound dressing: Secondary | ICD-10-CM | POA: Diagnosis not present

## 2018-09-23 DIAGNOSIS — L02212 Cutaneous abscess of back [any part, except buttock]: Secondary | ICD-10-CM | POA: Diagnosis not present

## 2018-09-23 DIAGNOSIS — L03312 Cellulitis of back [any part except buttock]: Secondary | ICD-10-CM | POA: Diagnosis not present

## 2018-09-23 DIAGNOSIS — B966 Bacteroides fragilis [B. fragilis] as the cause of diseases classified elsewhere: Secondary | ICD-10-CM | POA: Diagnosis not present

## 2018-09-23 DIAGNOSIS — L89143 Pressure ulcer of left lower back, stage 3: Secondary | ICD-10-CM | POA: Diagnosis not present

## 2018-09-23 DIAGNOSIS — L89152 Pressure ulcer of sacral region, stage 2: Secondary | ICD-10-CM | POA: Diagnosis not present

## 2018-09-26 DIAGNOSIS — Z4800 Encounter for change or removal of nonsurgical wound dressing: Secondary | ICD-10-CM | POA: Diagnosis not present

## 2018-09-26 DIAGNOSIS — L02212 Cutaneous abscess of back [any part, except buttock]: Secondary | ICD-10-CM | POA: Diagnosis not present

## 2018-09-26 DIAGNOSIS — L89152 Pressure ulcer of sacral region, stage 2: Secondary | ICD-10-CM | POA: Diagnosis not present

## 2018-09-26 DIAGNOSIS — L03312 Cellulitis of back [any part except buttock]: Secondary | ICD-10-CM | POA: Diagnosis not present

## 2018-09-26 DIAGNOSIS — L89143 Pressure ulcer of left lower back, stage 3: Secondary | ICD-10-CM | POA: Diagnosis not present

## 2018-09-26 DIAGNOSIS — B966 Bacteroides fragilis [B. fragilis] as the cause of diseases classified elsewhere: Secondary | ICD-10-CM | POA: Diagnosis not present

## 2018-09-27 DIAGNOSIS — G8250 Quadriplegia, unspecified: Secondary | ICD-10-CM | POA: Diagnosis not present

## 2018-09-27 DIAGNOSIS — D72829 Elevated white blood cell count, unspecified: Secondary | ICD-10-CM | POA: Diagnosis not present

## 2018-09-27 DIAGNOSIS — F79 Unspecified intellectual disabilities: Secondary | ICD-10-CM | POA: Diagnosis not present

## 2018-09-27 DIAGNOSIS — L03312 Cellulitis of back [any part except buttock]: Secondary | ICD-10-CM | POA: Diagnosis not present

## 2018-09-27 DIAGNOSIS — G40909 Epilepsy, unspecified, not intractable, without status epilepticus: Secondary | ICD-10-CM | POA: Diagnosis not present

## 2018-09-27 DIAGNOSIS — M47819 Spondylosis without myelopathy or radiculopathy, site unspecified: Secondary | ICD-10-CM | POA: Diagnosis not present

## 2018-09-27 DIAGNOSIS — Q039 Congenital hydrocephalus, unspecified: Secondary | ICD-10-CM | POA: Diagnosis not present

## 2018-09-27 DIAGNOSIS — L02212 Cutaneous abscess of back [any part, except buttock]: Secondary | ICD-10-CM | POA: Diagnosis not present

## 2018-09-27 DIAGNOSIS — E876 Hypokalemia: Secondary | ICD-10-CM | POA: Diagnosis not present

## 2018-09-27 DIAGNOSIS — M6284 Sarcopenia: Secondary | ICD-10-CM | POA: Diagnosis not present

## 2018-09-27 DIAGNOSIS — K5909 Other constipation: Secondary | ICD-10-CM | POA: Diagnosis not present

## 2018-09-27 DIAGNOSIS — E785 Hyperlipidemia, unspecified: Secondary | ICD-10-CM | POA: Diagnosis not present

## 2018-09-27 DIAGNOSIS — B966 Bacteroides fragilis [B. fragilis] as the cause of diseases classified elsewhere: Secondary | ICD-10-CM | POA: Diagnosis not present

## 2018-09-27 DIAGNOSIS — L89152 Pressure ulcer of sacral region, stage 2: Secondary | ICD-10-CM | POA: Diagnosis not present

## 2018-09-27 DIAGNOSIS — K8020 Calculus of gallbladder without cholecystitis without obstruction: Secondary | ICD-10-CM | POA: Diagnosis not present

## 2018-09-27 DIAGNOSIS — E1149 Type 2 diabetes mellitus with other diabetic neurological complication: Secondary | ICD-10-CM | POA: Diagnosis not present

## 2018-09-27 DIAGNOSIS — Z4800 Encounter for change or removal of nonsurgical wound dressing: Secondary | ICD-10-CM | POA: Diagnosis not present

## 2018-09-27 DIAGNOSIS — R32 Unspecified urinary incontinence: Secondary | ICD-10-CM | POA: Diagnosis not present

## 2018-09-27 DIAGNOSIS — L89143 Pressure ulcer of left lower back, stage 3: Secondary | ICD-10-CM | POA: Diagnosis not present

## 2018-09-27 DIAGNOSIS — K598 Other specified functional intestinal disorders: Secondary | ICD-10-CM | POA: Diagnosis not present

## 2018-09-27 DIAGNOSIS — L89322 Pressure ulcer of left buttock, stage 2: Secondary | ICD-10-CM | POA: Diagnosis not present

## 2018-09-27 DIAGNOSIS — D128 Benign neoplasm of rectum: Secondary | ICD-10-CM | POA: Diagnosis not present

## 2018-09-27 DIAGNOSIS — I10 Essential (primary) hypertension: Secondary | ICD-10-CM | POA: Diagnosis not present

## 2018-09-27 DIAGNOSIS — I051 Rheumatic mitral insufficiency: Secondary | ICD-10-CM | POA: Diagnosis not present

## 2018-09-27 DIAGNOSIS — M4185 Other forms of scoliosis, thoracolumbar region: Secondary | ICD-10-CM | POA: Diagnosis not present

## 2018-09-28 DIAGNOSIS — L03312 Cellulitis of back [any part except buttock]: Secondary | ICD-10-CM | POA: Diagnosis not present

## 2018-09-28 DIAGNOSIS — L89143 Pressure ulcer of left lower back, stage 3: Secondary | ICD-10-CM | POA: Diagnosis not present

## 2018-09-28 DIAGNOSIS — L89152 Pressure ulcer of sacral region, stage 2: Secondary | ICD-10-CM | POA: Diagnosis not present

## 2018-09-28 DIAGNOSIS — Z4800 Encounter for change or removal of nonsurgical wound dressing: Secondary | ICD-10-CM | POA: Diagnosis not present

## 2018-09-28 DIAGNOSIS — L02212 Cutaneous abscess of back [any part, except buttock]: Secondary | ICD-10-CM | POA: Diagnosis not present

## 2018-09-28 DIAGNOSIS — B966 Bacteroides fragilis [B. fragilis] as the cause of diseases classified elsewhere: Secondary | ICD-10-CM | POA: Diagnosis not present

## 2018-09-29 ENCOUNTER — Other Ambulatory Visit: Payer: Self-pay

## 2018-09-29 MED ORDER — UNABLE TO FIND: 11 refills | Status: AC

## 2018-09-30 DIAGNOSIS — L89152 Pressure ulcer of sacral region, stage 2: Secondary | ICD-10-CM | POA: Diagnosis not present

## 2018-09-30 DIAGNOSIS — L03312 Cellulitis of back [any part except buttock]: Secondary | ICD-10-CM | POA: Diagnosis not present

## 2018-09-30 DIAGNOSIS — B966 Bacteroides fragilis [B. fragilis] as the cause of diseases classified elsewhere: Secondary | ICD-10-CM | POA: Diagnosis not present

## 2018-09-30 DIAGNOSIS — Z4800 Encounter for change or removal of nonsurgical wound dressing: Secondary | ICD-10-CM | POA: Diagnosis not present

## 2018-09-30 DIAGNOSIS — L02212 Cutaneous abscess of back [any part, except buttock]: Secondary | ICD-10-CM | POA: Diagnosis not present

## 2018-09-30 DIAGNOSIS — L89143 Pressure ulcer of left lower back, stage 3: Secondary | ICD-10-CM | POA: Diagnosis not present

## 2018-10-03 DIAGNOSIS — L02212 Cutaneous abscess of back [any part, except buttock]: Secondary | ICD-10-CM | POA: Diagnosis not present

## 2018-10-03 DIAGNOSIS — L89152 Pressure ulcer of sacral region, stage 2: Secondary | ICD-10-CM | POA: Diagnosis not present

## 2018-10-03 DIAGNOSIS — L89143 Pressure ulcer of left lower back, stage 3: Secondary | ICD-10-CM | POA: Diagnosis not present

## 2018-10-03 DIAGNOSIS — L03312 Cellulitis of back [any part except buttock]: Secondary | ICD-10-CM | POA: Diagnosis not present

## 2018-10-03 DIAGNOSIS — Z4800 Encounter for change or removal of nonsurgical wound dressing: Secondary | ICD-10-CM | POA: Diagnosis not present

## 2018-10-03 DIAGNOSIS — B966 Bacteroides fragilis [B. fragilis] as the cause of diseases classified elsewhere: Secondary | ICD-10-CM | POA: Diagnosis not present

## 2018-10-04 ENCOUNTER — Other Ambulatory Visit: Payer: Self-pay | Admitting: Family Medicine

## 2018-10-04 ENCOUNTER — Ambulatory Visit (INDEPENDENT_AMBULATORY_CARE_PROVIDER_SITE_OTHER): Payer: Medicare Other | Admitting: General Surgery

## 2018-10-04 ENCOUNTER — Encounter: Payer: Self-pay | Admitting: General Surgery

## 2018-10-04 ENCOUNTER — Other Ambulatory Visit: Payer: Self-pay

## 2018-10-04 ENCOUNTER — Encounter: Payer: Self-pay | Admitting: Family Medicine

## 2018-10-04 ENCOUNTER — Ambulatory Visit (HOSPITAL_COMMUNITY)
Admission: RE | Admit: 2018-10-04 | Discharge: 2018-10-04 | Disposition: A | Discharge status: Home / Self Care | Payer: Medicare Other | Source: Ambulatory Visit | Attending: Family Medicine | Admitting: Family Medicine

## 2018-10-04 ENCOUNTER — Telehealth: Payer: Self-pay | Admitting: Family Medicine

## 2018-10-04 VITALS — BP 125/88 | HR 142 | Temp 96.9°F | Resp 18

## 2018-10-04 DIAGNOSIS — L89152 Pressure ulcer of sacral region, stage 2: Secondary | ICD-10-CM

## 2018-10-04 DIAGNOSIS — R059 Cough, unspecified: Secondary | ICD-10-CM

## 2018-10-04 DIAGNOSIS — R05 Cough: Secondary | ICD-10-CM

## 2018-10-04 DIAGNOSIS — L89103 Pressure ulcer of unspecified part of back, stage 3: Secondary | ICD-10-CM

## 2018-10-04 NOTE — Patient Instructions (Signed)
Saline dampened kerlix and ABD pad to left side wound daily or as needed for drainage until April Watson starts the Alginate.

## 2018-10-04 NOTE — Telephone Encounter (Signed)
Spoke with April Watson directly, states has been coughing since past 4 days, seems to be mucus, and seems to have problems swallowing, throat looks very red, No fever, no nasal drainage States intake is about the same as before Will follow up on CXR report with family members. I explained that for difficulty swallowing the  option is a feeding tube, and this is not preferred / entertained currently Will continue to monitor at home. If develops fever, breathing difficulty , or poor urine output and worsening intake she is to be taken to the ED.I recommended use of tessalon Perles for secretions. Wound vac has been removed today at visit as bone and muscle is now exposed per family member April Watson expressed understanding and agreement with the plan

## 2018-10-04 NOTE — Progress Notes (Signed)
[facility] Note   HPI:  58 y.o. Female presents to clinic for follow-up evaluation of the left side wound. The family says things have been going well but the home health RN says there is a new rib exposed.  They have been using white sponge over the rib areas. The family is trying to get more protein calories in the patient but this is limited.   Review of Systems:  No fever or chills Drainage from wound  All other review of systems: otherwise negative   Vital Signs:  BP 125/88 (BP Location: Left Arm, Patient Position: Sitting, Cuff Size: Normal)   Pulse (!) 142   Temp (!) 96.9 F (36.1 C) (Tympanic)   Resp 18   SpO2 97%    Physical Exam:  Physical Exam Constitutional:      General: She is not in acute distress. Musculoskeletal:     Comments: Left side wound with granulation, less fibrinous material on the edges, two areas of rib exposed now, new one more superior, filmy drainage       Assessment:  58 y.o. yo Female with a left side wound from debridement of a decubitus ulcer, healing but due to weight loss and pressure having ribs exposed. I am worried that continuing the sponge will result in more bone exposure given the lack of nutrition and limited mobility. I talked to [place] the home health RN, and the wound drains significantly. We are going to change to an alginate dressing three times a week as the family is not very comfortable with dressing changes for the longterm.  Plan:  Saline dampened kerlix and ABD pad to left side wound daily or as needed for drainage until [place] starts the Alginate.   Future Appointments  Date Time Provider [place]  10/18/2018 11:30 AM Virl Cagey, MD RS-RS None  10/27/2018  1:00 PM Perlie Mayo, NP R[facility]  12/28/2018 10:20 AM Fayrene Helper, MD R[facility] R[facility]    All of the above recommendations were discussed with the patient family, and all of family's questions were answered to their expressed  satisfaction.  Over 25 minutes was spent with the patient and the family, and changing the wound vacuum and making plans for her wound care.   Curlene Labrum, MD [facility] [address] [place], [place] [address] [phone] (office)

## 2018-10-04 NOTE — Telephone Encounter (Signed)
[name] is calling back in and states that April Watson doesn't want to eat or drink as she feels it will cause her to strangle, and the care taker has had to call 911 on a episode

## 2018-10-04 NOTE — Telephone Encounter (Signed)
CXR ordered 

## 2018-10-04 NOTE — Telephone Encounter (Signed)
Pt has non productive cough - no fever

## 2018-10-04 NOTE — Progress Notes (Signed)
cXR already ordered

## 2018-10-04 NOTE — Telephone Encounter (Signed)
[name] is calling to advise April Watson is getting choked on the littlest things, and has a bad cough, the nurse said she thought she heard something in the lobes   Wanting to know if you want to order a chest xray --since they have her out--they are headed to the wound center. Please call on April Watson cell phone

## 2018-10-05 DIAGNOSIS — B966 Bacteroides fragilis [B. fragilis] as the cause of diseases classified elsewhere: Secondary | ICD-10-CM | POA: Diagnosis not present

## 2018-10-05 DIAGNOSIS — Z4800 Encounter for change or removal of nonsurgical wound dressing: Secondary | ICD-10-CM | POA: Diagnosis not present

## 2018-10-05 DIAGNOSIS — L89152 Pressure ulcer of sacral region, stage 2: Secondary | ICD-10-CM | POA: Diagnosis not present

## 2018-10-05 DIAGNOSIS — L89143 Pressure ulcer of left lower back, stage 3: Secondary | ICD-10-CM | POA: Diagnosis not present

## 2018-10-05 DIAGNOSIS — L02212 Cutaneous abscess of back [any part, except buttock]: Secondary | ICD-10-CM | POA: Diagnosis not present

## 2018-10-05 DIAGNOSIS — L03312 Cellulitis of back [any part except buttock]: Secondary | ICD-10-CM | POA: Diagnosis not present

## 2018-10-06 DIAGNOSIS — I10 Essential (primary) hypertension: Secondary | ICD-10-CM

## 2018-10-06 DIAGNOSIS — K5909 Other constipation: Secondary | ICD-10-CM

## 2018-10-06 DIAGNOSIS — K8020 Calculus of gallbladder without cholecystitis without obstruction: Secondary | ICD-10-CM

## 2018-10-06 DIAGNOSIS — K598 Other specified functional intestinal disorders: Secondary | ICD-10-CM

## 2018-10-06 DIAGNOSIS — M6284 Sarcopenia: Secondary | ICD-10-CM

## 2018-10-06 DIAGNOSIS — L03312 Cellulitis of back [any part except buttock]: Secondary | ICD-10-CM | POA: Diagnosis not present

## 2018-10-06 DIAGNOSIS — M47819 Spondylosis without myelopathy or radiculopathy, site unspecified: Secondary | ICD-10-CM

## 2018-10-06 DIAGNOSIS — E876 Hypokalemia: Secondary | ICD-10-CM

## 2018-10-06 DIAGNOSIS — L89152 Pressure ulcer of sacral region, stage 2: Secondary | ICD-10-CM | POA: Diagnosis not present

## 2018-10-06 DIAGNOSIS — D72829 Elevated white blood cell count, unspecified: Secondary | ICD-10-CM

## 2018-10-06 DIAGNOSIS — L02212 Cutaneous abscess of back [any part, except buttock]: Secondary | ICD-10-CM | POA: Diagnosis not present

## 2018-10-06 DIAGNOSIS — M4185 Other forms of scoliosis, thoracolumbar region: Secondary | ICD-10-CM

## 2018-10-06 DIAGNOSIS — E1149 Type 2 diabetes mellitus with other diabetic neurological complication: Secondary | ICD-10-CM | POA: Diagnosis not present

## 2018-10-06 DIAGNOSIS — Z4800 Encounter for change or removal of nonsurgical wound dressing: Secondary | ICD-10-CM | POA: Diagnosis not present

## 2018-10-06 DIAGNOSIS — Z9181 History of falling: Secondary | ICD-10-CM

## 2018-10-06 DIAGNOSIS — J9811 Atelectasis: Secondary | ICD-10-CM

## 2018-10-06 DIAGNOSIS — L89143 Pressure ulcer of left lower back, stage 3: Secondary | ICD-10-CM | POA: Diagnosis not present

## 2018-10-06 DIAGNOSIS — R32 Unspecified urinary incontinence: Secondary | ICD-10-CM

## 2018-10-06 DIAGNOSIS — I051 Rheumatic mitral insufficiency: Secondary | ICD-10-CM

## 2018-10-06 DIAGNOSIS — E785 Hyperlipidemia, unspecified: Secondary | ICD-10-CM

## 2018-10-06 DIAGNOSIS — L89322 Pressure ulcer of left buttock, stage 2: Secondary | ICD-10-CM

## 2018-10-06 DIAGNOSIS — G8250 Quadriplegia, unspecified: Secondary | ICD-10-CM | POA: Diagnosis not present

## 2018-10-06 DIAGNOSIS — F79 Unspecified intellectual disabilities: Secondary | ICD-10-CM

## 2018-10-06 DIAGNOSIS — D128 Benign neoplasm of rectum: Secondary | ICD-10-CM

## 2018-10-06 DIAGNOSIS — Q039 Congenital hydrocephalus, unspecified: Secondary | ICD-10-CM | POA: Diagnosis not present

## 2018-10-06 DIAGNOSIS — G40909 Epilepsy, unspecified, not intractable, without status epilepticus: Secondary | ICD-10-CM

## 2018-10-06 DIAGNOSIS — B966 Bacteroides fragilis [B. fragilis] as the cause of diseases classified elsewhere: Secondary | ICD-10-CM

## 2018-10-06 DIAGNOSIS — K219 Gastro-esophageal reflux disease without esophagitis: Secondary | ICD-10-CM

## 2018-10-07 DIAGNOSIS — B966 Bacteroides fragilis [B. fragilis] as the cause of diseases classified elsewhere: Secondary | ICD-10-CM | POA: Diagnosis not present

## 2018-10-07 DIAGNOSIS — L89143 Pressure ulcer of left lower back, stage 3: Secondary | ICD-10-CM | POA: Diagnosis not present

## 2018-10-07 DIAGNOSIS — L02212 Cutaneous abscess of back [any part, except buttock]: Secondary | ICD-10-CM | POA: Diagnosis not present

## 2018-10-07 DIAGNOSIS — Z4800 Encounter for change or removal of nonsurgical wound dressing: Secondary | ICD-10-CM | POA: Diagnosis not present

## 2018-10-07 DIAGNOSIS — L89152 Pressure ulcer of sacral region, stage 2: Secondary | ICD-10-CM | POA: Diagnosis not present

## 2018-10-07 DIAGNOSIS — L03312 Cellulitis of back [any part except buttock]: Secondary | ICD-10-CM | POA: Diagnosis not present

## 2018-10-10 DIAGNOSIS — B966 Bacteroides fragilis [B. fragilis] as the cause of diseases classified elsewhere: Secondary | ICD-10-CM | POA: Diagnosis not present

## 2018-10-10 DIAGNOSIS — L89143 Pressure ulcer of left lower back, stage 3: Secondary | ICD-10-CM | POA: Diagnosis not present

## 2018-10-10 DIAGNOSIS — L03312 Cellulitis of back [any part except buttock]: Secondary | ICD-10-CM | POA: Diagnosis not present

## 2018-10-10 DIAGNOSIS — L89152 Pressure ulcer of sacral region, stage 2: Secondary | ICD-10-CM | POA: Diagnosis not present

## 2018-10-10 DIAGNOSIS — Z4800 Encounter for change or removal of nonsurgical wound dressing: Secondary | ICD-10-CM | POA: Diagnosis not present

## 2018-10-10 DIAGNOSIS — L02212 Cutaneous abscess of back [any part, except buttock]: Secondary | ICD-10-CM | POA: Diagnosis not present

## 2018-10-11 ENCOUNTER — Telehealth: Payer: Self-pay | Admitting: Family Medicine

## 2018-10-11 ENCOUNTER — Other Ambulatory Visit: Payer: Self-pay | Admitting: Family Medicine

## 2018-10-11 MED ORDER — BENZONATATE 100 MG PO CAPS: 100.0000 mg | ORAL_CAPSULE | Freq: Three times a day (TID) | ORAL | 3 refills | Status: DC | PRN

## 2018-10-11 NOTE — Telephone Encounter (Signed)
pls le her know tessalon perles have been prescribed

## 2018-10-11 NOTE — Telephone Encounter (Signed)
Ann stopped by to see if Jalissia could take the cough pills for longer than 15 days, as she has seen improvement in her. Also she ate Oatmeal this morning and did fine.  She said she would like to talk to you when the others was available, I told her to get with them and call me back so we could work on a time to do a conference call. I also talked to her about Hospice and was advising her that Hospice was a resource for Kenyetta and the family.  She thanked me for talking with her, and sent her love.

## 2018-10-12 ENCOUNTER — Telehealth: Payer: Self-pay | Admitting: *Deleted

## 2018-10-12 ENCOUNTER — Other Ambulatory Visit: Payer: Self-pay | Admitting: Family Medicine

## 2018-10-12 ENCOUNTER — Other Ambulatory Visit: Payer: Self-pay

## 2018-10-12 ENCOUNTER — Encounter: Payer: Self-pay | Admitting: Family Medicine

## 2018-10-12 DIAGNOSIS — Z4800 Encounter for change or removal of nonsurgical wound dressing: Secondary | ICD-10-CM | POA: Diagnosis not present

## 2018-10-12 DIAGNOSIS — L89152 Pressure ulcer of sacral region, stage 2: Secondary | ICD-10-CM | POA: Diagnosis not present

## 2018-10-12 DIAGNOSIS — B966 Bacteroides fragilis [B. fragilis] as the cause of diseases classified elsewhere: Secondary | ICD-10-CM | POA: Diagnosis not present

## 2018-10-12 DIAGNOSIS — L02212 Cutaneous abscess of back [any part, except buttock]: Secondary | ICD-10-CM | POA: Diagnosis not present

## 2018-10-12 DIAGNOSIS — L03312 Cellulitis of back [any part except buttock]: Secondary | ICD-10-CM | POA: Diagnosis not present

## 2018-10-12 DIAGNOSIS — L89143 Pressure ulcer of left lower back, stage 3: Secondary | ICD-10-CM | POA: Diagnosis not present

## 2018-10-12 MED ORDER — POLYETHYLENE GLYCOL 3350 17 GM/SCOOP PO POWD: 17.0000 g | Freq: Two times a day (BID) | ORAL | 5 refills | Status: DC | PRN

## 2018-10-12 MED ORDER — UNABLE TO FIND: 11 refills | Status: AC

## 2018-10-12 NOTE — Telephone Encounter (Signed)
April Watson pt sister on [place] called wanting to know if Dr Moshe Cipro would call in miralax to [facility] for pt. She had it before through hospitalist but has ran out of refills.

## 2018-10-12 NOTE — Telephone Encounter (Signed)
Sent and pt msg sent

## 2018-10-12 NOTE — Telephone Encounter (Signed)
Spoke with [name] and let her know prescription has been sent in.

## 2018-10-12 NOTE — Progress Notes (Signed)
mirilax

## 2018-10-13 ENCOUNTER — Other Ambulatory Visit: Payer: Self-pay

## 2018-10-13 ENCOUNTER — Encounter: Payer: Self-pay | Admitting: General Surgery

## 2018-10-13 ENCOUNTER — Ambulatory Visit (INDEPENDENT_AMBULATORY_CARE_PROVIDER_SITE_OTHER): Payer: Medicare Other | Admitting: General Surgery

## 2018-10-13 VITALS — BP 106/74 | HR 131 | Temp 96.9°F | Resp 16 | Ht 62.0 in

## 2018-10-13 DIAGNOSIS — L89152 Pressure ulcer of sacral region, stage 2: Secondary | ICD-10-CM | POA: Diagnosis not present

## 2018-10-13 DIAGNOSIS — L89103 Pressure ulcer of unspecified part of back, stage 3: Secondary | ICD-10-CM | POA: Diagnosis not present

## 2018-10-13 NOTE — Progress Notes (Signed)
[facility] Note   HPI:  58 y.o. Female presents to clinic for follow-up evaluation of the left flank wound and sacral wounds. The family is doing a great job. They are changing the calcium alginate dressing daily after we stopped the wound vacuum due to the ribs being exposed.   Review of Systems:  Drainage from wound continues  All other review of systems: otherwise negative   Vital Signs:  BP 106/74 (BP Location: Left Arm, Patient Position: Sitting, Cuff Size: Normal)   Pulse (!) 131   Temp (!) 96.9 F (36.1 C) (Tympanic)   Resp 16   Ht 5\' 2"  (1.575 m)   SpO2 97%   BMI 31.09 kg/m    Physical Exam:  Physical Exam Vitals signs reviewed.  Cardiovascular:     Rate and Rhythm: Normal rate.  Pulmonary:     Effort: Pulmonary effort is normal.  Musculoskeletal:     Comments: Left flank wound with granulation, healthy and beefy red, ribs that were getting exposed how granulation forming over them; sacral wound small < 1cm in size, stage II  Neurological:     Mental Status: She is alert.     Assessment:  58 y.o. yo Female with left flank decubitus ulcer wound and sacral decubitus ulcer. Now getting calcium alginate dressing daily. The home health RN and family are doing a great job.  -Continue current dressing plans -Told family this could take months to decrease in size and may never fully heal -Continue aggressive turning -Continue protein supplementation  Plan:  Future Appointments  Date Time Provider [place]  10/27/2018  1:00 PM Perlie Mayo, NP [facility]  11/10/2018  3:30 PM Virl Cagey, MD RS-RS None  12/28/2018 10:20 AM Fayrene Helper, MD [facility]     All of the above recommendations were discussed with the patient and patient's family, and all of patient's and family's questions were answered to their expressed satisfaction.  Curlene Labrum, MD [facility] [address] [place],  [place] [address] [phone] (office)

## 2018-10-13 NOTE — Patient Instructions (Signed)
Continue wound care. ?

## 2018-10-14 DIAGNOSIS — L89152 Pressure ulcer of sacral region, stage 2: Secondary | ICD-10-CM | POA: Diagnosis not present

## 2018-10-14 DIAGNOSIS — L02212 Cutaneous abscess of back [any part, except buttock]: Secondary | ICD-10-CM | POA: Diagnosis not present

## 2018-10-14 DIAGNOSIS — Z4800 Encounter for change or removal of nonsurgical wound dressing: Secondary | ICD-10-CM | POA: Diagnosis not present

## 2018-10-14 DIAGNOSIS — B966 Bacteroides fragilis [B. fragilis] as the cause of diseases classified elsewhere: Secondary | ICD-10-CM | POA: Diagnosis not present

## 2018-10-14 DIAGNOSIS — L03312 Cellulitis of back [any part except buttock]: Secondary | ICD-10-CM | POA: Diagnosis not present

## 2018-10-14 DIAGNOSIS — L89143 Pressure ulcer of left lower back, stage 3: Secondary | ICD-10-CM | POA: Diagnosis not present

## 2018-10-17 DIAGNOSIS — L89143 Pressure ulcer of left lower back, stage 3: Secondary | ICD-10-CM | POA: Diagnosis not present

## 2018-10-17 DIAGNOSIS — L02212 Cutaneous abscess of back [any part, except buttock]: Secondary | ICD-10-CM | POA: Diagnosis not present

## 2018-10-17 DIAGNOSIS — B966 Bacteroides fragilis [B. fragilis] as the cause of diseases classified elsewhere: Secondary | ICD-10-CM | POA: Diagnosis not present

## 2018-10-17 DIAGNOSIS — L89152 Pressure ulcer of sacral region, stage 2: Secondary | ICD-10-CM | POA: Diagnosis not present

## 2018-10-17 DIAGNOSIS — Z4800 Encounter for change or removal of nonsurgical wound dressing: Secondary | ICD-10-CM | POA: Diagnosis not present

## 2018-10-17 DIAGNOSIS — L03312 Cellulitis of back [any part except buttock]: Secondary | ICD-10-CM | POA: Diagnosis not present

## 2018-10-18 ENCOUNTER — Telehealth: Payer: Self-pay

## 2018-10-18 ENCOUNTER — Ambulatory Visit: Payer: Self-pay | Admitting: General Surgery

## 2018-10-18 NOTE — Telephone Encounter (Signed)
Called [facility] to see if they had patient's Miralax prescription. They did receive it, however, the patient has Medicare and they won't cover it because it is an otc drug. Renato Gails, patient's sister of the same thing with verbal understanding.

## 2018-10-19 DIAGNOSIS — L03312 Cellulitis of back [any part except buttock]: Secondary | ICD-10-CM | POA: Diagnosis not present

## 2018-10-19 DIAGNOSIS — L89143 Pressure ulcer of left lower back, stage 3: Secondary | ICD-10-CM | POA: Diagnosis not present

## 2018-10-19 DIAGNOSIS — L02212 Cutaneous abscess of back [any part, except buttock]: Secondary | ICD-10-CM | POA: Diagnosis not present

## 2018-10-19 DIAGNOSIS — L89152 Pressure ulcer of sacral region, stage 2: Secondary | ICD-10-CM | POA: Diagnosis not present

## 2018-10-19 DIAGNOSIS — B966 Bacteroides fragilis [B. fragilis] as the cause of diseases classified elsewhere: Secondary | ICD-10-CM | POA: Diagnosis not present

## 2018-10-19 DIAGNOSIS — Z4800 Encounter for change or removal of nonsurgical wound dressing: Secondary | ICD-10-CM | POA: Diagnosis not present

## 2018-10-21 DIAGNOSIS — L03312 Cellulitis of back [any part except buttock]: Secondary | ICD-10-CM | POA: Diagnosis not present

## 2018-10-21 DIAGNOSIS — L02212 Cutaneous abscess of back [any part, except buttock]: Secondary | ICD-10-CM | POA: Diagnosis not present

## 2018-10-21 DIAGNOSIS — L89152 Pressure ulcer of sacral region, stage 2: Secondary | ICD-10-CM | POA: Diagnosis not present

## 2018-10-21 DIAGNOSIS — L89143 Pressure ulcer of left lower back, stage 3: Secondary | ICD-10-CM | POA: Diagnosis not present

## 2018-10-21 DIAGNOSIS — Z4800 Encounter for change or removal of nonsurgical wound dressing: Secondary | ICD-10-CM | POA: Diagnosis not present

## 2018-10-21 DIAGNOSIS — B966 Bacteroides fragilis [B. fragilis] as the cause of diseases classified elsewhere: Secondary | ICD-10-CM | POA: Diagnosis not present

## 2018-10-24 ENCOUNTER — Encounter: Payer: Self-pay | Admitting: Family Medicine

## 2018-10-24 DIAGNOSIS — L89143 Pressure ulcer of left lower back, stage 3: Secondary | ICD-10-CM | POA: Diagnosis not present

## 2018-10-24 DIAGNOSIS — Z4800 Encounter for change or removal of nonsurgical wound dressing: Secondary | ICD-10-CM | POA: Diagnosis not present

## 2018-10-24 DIAGNOSIS — B966 Bacteroides fragilis [B. fragilis] as the cause of diseases classified elsewhere: Secondary | ICD-10-CM | POA: Diagnosis not present

## 2018-10-24 DIAGNOSIS — L89152 Pressure ulcer of sacral region, stage 2: Secondary | ICD-10-CM | POA: Diagnosis not present

## 2018-10-24 DIAGNOSIS — L02212 Cutaneous abscess of back [any part, except buttock]: Secondary | ICD-10-CM | POA: Diagnosis not present

## 2018-10-24 DIAGNOSIS — L03312 Cellulitis of back [any part except buttock]: Secondary | ICD-10-CM | POA: Diagnosis not present

## 2018-10-26 DIAGNOSIS — L89152 Pressure ulcer of sacral region, stage 2: Secondary | ICD-10-CM | POA: Diagnosis not present

## 2018-10-26 DIAGNOSIS — B966 Bacteroides fragilis [B. fragilis] as the cause of diseases classified elsewhere: Secondary | ICD-10-CM | POA: Diagnosis not present

## 2018-10-26 DIAGNOSIS — L03312 Cellulitis of back [any part except buttock]: Secondary | ICD-10-CM | POA: Diagnosis not present

## 2018-10-26 DIAGNOSIS — Z4800 Encounter for change or removal of nonsurgical wound dressing: Secondary | ICD-10-CM | POA: Diagnosis not present

## 2018-10-26 DIAGNOSIS — L89143 Pressure ulcer of left lower back, stage 3: Secondary | ICD-10-CM | POA: Diagnosis not present

## 2018-10-26 DIAGNOSIS — L02212 Cutaneous abscess of back [any part, except buttock]: Secondary | ICD-10-CM | POA: Diagnosis not present

## 2018-10-27 ENCOUNTER — Other Ambulatory Visit: Payer: Self-pay

## 2018-10-27 ENCOUNTER — Encounter: Payer: Self-pay | Admitting: Family Medicine

## 2018-10-27 ENCOUNTER — Ambulatory Visit (INDEPENDENT_AMBULATORY_CARE_PROVIDER_SITE_OTHER): Payer: Medicare Other | Admitting: Family Medicine

## 2018-10-27 VITALS — BP 106/74 | HR 131 | Resp 16 | Ht 62.0 in | Wt 170.0 lb

## 2018-10-27 DIAGNOSIS — D72829 Elevated white blood cell count, unspecified: Secondary | ICD-10-CM | POA: Diagnosis not present

## 2018-10-27 DIAGNOSIS — K5989 Other specified functional intestinal disorders: Secondary | ICD-10-CM | POA: Diagnosis not present

## 2018-10-27 DIAGNOSIS — B966 Bacteroides fragilis [B. fragilis] as the cause of diseases classified elsewhere: Secondary | ICD-10-CM | POA: Diagnosis not present

## 2018-10-27 DIAGNOSIS — E876 Hypokalemia: Secondary | ICD-10-CM | POA: Diagnosis not present

## 2018-10-27 DIAGNOSIS — Q039 Congenital hydrocephalus, unspecified: Secondary | ICD-10-CM | POA: Diagnosis not present

## 2018-10-27 DIAGNOSIS — M6284 Sarcopenia: Secondary | ICD-10-CM | POA: Diagnosis not present

## 2018-10-27 DIAGNOSIS — I051 Rheumatic mitral insufficiency: Secondary | ICD-10-CM | POA: Diagnosis not present

## 2018-10-27 DIAGNOSIS — Z0000 Encounter for general adult medical examination without abnormal findings: Secondary | ICD-10-CM

## 2018-10-27 DIAGNOSIS — M47819 Spondylosis without myelopathy or radiculopathy, site unspecified: Secondary | ICD-10-CM | POA: Diagnosis not present

## 2018-10-27 DIAGNOSIS — E1149 Type 2 diabetes mellitus with other diabetic neurological complication: Secondary | ICD-10-CM | POA: Diagnosis not present

## 2018-10-27 DIAGNOSIS — D128 Benign neoplasm of rectum: Secondary | ICD-10-CM | POA: Diagnosis not present

## 2018-10-27 DIAGNOSIS — G40909 Epilepsy, unspecified, not intractable, without status epilepticus: Secondary | ICD-10-CM | POA: Diagnosis not present

## 2018-10-27 DIAGNOSIS — L02212 Cutaneous abscess of back [any part, except buttock]: Secondary | ICD-10-CM | POA: Diagnosis not present

## 2018-10-27 DIAGNOSIS — Z4800 Encounter for change or removal of nonsurgical wound dressing: Secondary | ICD-10-CM | POA: Diagnosis not present

## 2018-10-27 DIAGNOSIS — G8250 Quadriplegia, unspecified: Secondary | ICD-10-CM | POA: Diagnosis not present

## 2018-10-27 DIAGNOSIS — K8020 Calculus of gallbladder without cholecystitis without obstruction: Secondary | ICD-10-CM | POA: Diagnosis not present

## 2018-10-27 DIAGNOSIS — E785 Hyperlipidemia, unspecified: Secondary | ICD-10-CM | POA: Diagnosis not present

## 2018-10-27 DIAGNOSIS — L89322 Pressure ulcer of left buttock, stage 2: Secondary | ICD-10-CM | POA: Diagnosis not present

## 2018-10-27 DIAGNOSIS — R32 Unspecified urinary incontinence: Secondary | ICD-10-CM | POA: Diagnosis not present

## 2018-10-27 DIAGNOSIS — F79 Unspecified intellectual disabilities: Secondary | ICD-10-CM | POA: Diagnosis not present

## 2018-10-27 DIAGNOSIS — L03312 Cellulitis of back [any part except buttock]: Secondary | ICD-10-CM | POA: Diagnosis not present

## 2018-10-27 DIAGNOSIS — K5909 Other constipation: Secondary | ICD-10-CM | POA: Diagnosis not present

## 2018-10-27 DIAGNOSIS — L89143 Pressure ulcer of left lower back, stage 3: Secondary | ICD-10-CM | POA: Diagnosis not present

## 2018-10-27 DIAGNOSIS — I10 Essential (primary) hypertension: Secondary | ICD-10-CM | POA: Diagnosis not present

## 2018-10-27 DIAGNOSIS — M4185 Other forms of scoliosis, thoracolumbar region: Secondary | ICD-10-CM | POA: Diagnosis not present

## 2018-10-27 DIAGNOSIS — L89152 Pressure ulcer of sacral region, stage 2: Secondary | ICD-10-CM | POA: Diagnosis not present

## 2018-10-27 NOTE — Patient Instructions (Signed)
April Watson , Thank you for taking time to come for your Medicare Wellness Visit. I appreciate your ongoing commitment to your health goals. Please review the following plan we discussed and let me know if I can assist you in the future.   Please continue to practice social distancing to keep you, your family, and our community safe.  If you must go out, please wear a Mask and practice good handwashing.  Screening recommendations/referrals: Colonoscopy: Due 2023 Mammogram: Unable to get currently  Bone Density: Unable to get currently Recommended yearly ophthalmology/optometry visit for glaucoma screening and checkup Recommended yearly dental visit for hygiene and checkup  Vaccinations: Influenza vaccine: Completed Pneumococcal vaccine: Completed Tdap vaccine: Due 2028 Shingles vaccine: Check coverage with insurance  Advanced directives: Sister -HPOA   Conditions/risks identified: Falls   Next appointment: 12/28/2018    Preventive Care 34 Years and Older, Female Preventive care refers to lifestyle choices and visits with your health care provider that can promote health and wellness. What does preventive care include?  A yearly physical exam. This is also called an annual well check.  Dental exams once or twice a year.  Routine eye exams. Ask your health care provider how often you should have your eyes checked.  Personal lifestyle choices, including:  Daily care of your teeth and gums.  Regular physical activity.  Eating a healthy diet.  Avoiding tobacco and drug use.  Limiting alcohol use.  Practicing safe sex.  Taking low-dose aspirin every day.  Taking vitamin and mineral supplements as recommended by your health care provider. What happens during an annual well check? The services and screenings done by your health care provider during your annual well check will depend on your age, overall health, lifestyle risk factors, and family history of disease.  Counseling  Your health care provider may ask you questions about your:  Alcohol use.  Tobacco use.  Drug use.  Emotional well-being.  Home and relationship well-being.  Sexual activity.  Eating habits.  History of falls.  Memory and ability to understand (cognition).  Work and work Statistician.  Reproductive health. Screening  You may have the following tests or measurements:  Height, weight, and BMI.  Blood pressure.  Lipid and cholesterol levels. These may be checked every 5 years, or more frequently if you are over 96 years old.  Skin check.  Lung cancer screening. You may have this screening every year starting at age 45 if you have a 30-pack-year history of smoking and currently smoke or have quit within the past 15 years.  Fecal occult blood test (FOBT) of the stool. You may have this test every year starting at age 69.  Flexible sigmoidoscopy or colonoscopy. You may have a sigmoidoscopy every 5 years or a colonoscopy every 10 years starting at age 70.  Hepatitis C blood test.  Hepatitis B blood test.  Sexually transmitted disease (STD) testing.  Diabetes screening. This is done by checking your blood sugar (glucose) after you have not eaten for a while (fasting). You may have this done every 1-3 years.  Bone density scan. This is done to screen for osteoporosis. You may have this done starting at age 71.  Mammogram. This may be done every 1-2 years. Talk to your health care provider about how often you should have regular mammograms. Talk with your health care provider about your test results, treatment options, and if necessary, the need for more tests. Vaccines  Your health care provider may recommend certain vaccines, such  as:  Influenza vaccine. This is recommended every year.  Tetanus, diphtheria, and acellular pertussis (Tdap, Td) vaccine. You may need a Td booster every 10 years.  Zoster vaccine. You may need this after age 59.   Pneumococcal 13-valent conjugate (PCV13) vaccine. One dose is recommended after age 25.  Pneumococcal polysaccharide (PPSV23) vaccine. One dose is recommended after age 90. Talk to your health care provider about which screenings and vaccines you need and how often you need them. This information is not intended to replace advice given to you by your health care provider. Make sure you discuss any questions you have with your health care provider. Document Released: 02/01/2015 Document Revised: 09/25/2015 Document Reviewed: 11/06/2014 Elsevier Interactive Patient Education  2017 [employer] Prevention in the Home Falls can cause injuries. They can happen to people of all ages. There are many things you can do to make your home safe and to help prevent falls. What can I do on the outside of my home?  Regularly fix the edges of walkways and driveways and fix any cracks.  Remove anything that might make you trip as you walk through a door, such as a raised step or threshold.  Trim any bushes or trees on the path to your home.  Use bright outdoor lighting.  Clear any walking paths of anything that might make someone trip, such as rocks or tools.  Regularly check to see if handrails are loose or broken. Make sure that both sides of any steps have handrails.  Any raised decks and porches should have guardrails on the edges.  Have any leaves, snow, or ice cleared regularly.  Use sand or salt on walking paths during winter.  Clean up any spills in your garage right away. This includes oil or grease spills. What can I do in the bathroom?  Use night lights.  Install grab bars by the toilet and in the tub and shower. Do not use towel bars as grab bars.  Use non-skid mats or decals in the tub or shower.  If you need to sit down in the shower, use a plastic, non-slip stool.  Keep the floor dry. Clean up any water that spills on the floor as soon as it happens.  Remove soap  buildup in the tub or shower regularly.  Attach bath mats securely with double-sided non-slip rug tape.  Do not have throw rugs and other things on the floor that can make you trip. What can I do in the bedroom?  Use night lights.  Make sure that you have a light by your bed that is easy to reach.  Do not use any sheets or blankets that are too big for your bed. They should not hang down onto the floor.  Have a firm chair that has side arms. You can use this for support while you get dressed.  Do not have throw rugs and other things on the floor that can make you trip. What can I do in the kitchen?  Clean up any spills right away.  Avoid walking on wet floors.  Keep items that you use a lot in easy-to-reach places.  If you need to reach something above you, use a strong step stool that has a grab bar.  Keep electrical cords out of the way.  Do not use floor polish or wax that makes floors slippery. If you must use wax, use non-skid floor wax.  Do not have throw rugs and other things on the  floor that can make you trip. What can I do with my stairs?  Do not leave any items on the stairs.  Make sure that there are handrails on both sides of the stairs and use them. Fix handrails that are broken or loose. Make sure that handrails are as long as the stairways.  Check any carpeting to make sure that it is firmly attached to the stairs. Fix any carpet that is loose or worn.  Avoid having throw rugs at the top or bottom of the stairs. If you do have throw rugs, attach them to the floor with carpet tape.  Make sure that you have a light switch at the top of the stairs and the bottom of the stairs. If you do not have them, ask someone to add them for you. What else can I do to help prevent falls?  Wear shoes that:  Do not have high heels.  Have rubber bottoms.  Are comfortable and fit you well.  Are closed at the toe. Do not wear sandals.  If you use a stepladder:  Make  sure that it is fully opened. Do not climb a closed stepladder.  Make sure that both sides of the stepladder are locked into place.  Ask someone to hold it for you, if possible.  Clearly mark and make sure that you can see:  Any grab bars or handrails.  First and last steps.  Where the edge of each step is.  Use tools that help you move around (mobility aids) if they are needed. These include:  Canes.  Walkers.  Scooters.  Crutches.  Turn on the lights when you go into a dark area. Replace any light bulbs as soon as they burn out.  Set up your furniture so you have a clear path. Avoid moving your furniture around.  If any of your floors are uneven, fix them.  If there are any pets around you, be aware of where they are.  Review your medicines with your doctor. Some medicines can make you feel dizzy. This can increase your chance of falling. Ask your doctor what other things that you can do to help prevent falls. This information is not intended to replace advice given to you by your health care provider. Make sure you discuss any questions you have with your health care provider. Document Released: 11/01/2008 Document Revised: 06/13/2015 Document Reviewed: 02/09/2014 Elsevier Interactive Patient Education  2017 [employer].

## 2018-10-27 NOTE — Progress Notes (Signed)
Subjective:   April Watson is a 58 y.o. female who presents for Medicare Annual (Subsequent) preventive examination.  Location of Patient: Home Location of Provider: Telehealth Consent was obtain for visit to be over via telehealth. I verified that I am speaking with the correct person using two identifiers.  Review of Systems:    Cardiac Risk Factors include: diabetes mellitus;dyslipidemia;obesity (BMI >30kg/m2)     Objective:     Vitals: BP 106/74   Pulse (!) 131   Resp 16   Ht '5\' 2"'  (1.575 m)   Wt 170 lb (77.1 kg)   BMI 31.09 kg/m   Body mass index is 31.09 kg/m.  Advanced Directives 08/06/2018 08/03/2018 08/03/2018 07/21/2018 07/20/2018 10/27/2017 10/14/2016  Does Patient Have a Medical Advance Directive? (No Data) No No No No Yes No  Type of Advance Directive - - - - - Banker in Chart? - - - - - No - copy requested -  Would patient like information on creating a medical advance directive? - No - Patient declined No - Guardian declined No - Guardian declined No - Patient declined - Yes (MAU/Ambulatory/Procedural Areas - Information given)  Pre-existing out of facility DNR order (yellow form or pink MOST form) - - - - - - -    Tobacco Social History   Tobacco Use  Smoking Status Never Smoker  Smokeless Tobacco Never Used     Counseling given: Yes   Clinical Intake:  Pre-visit preparation completed: Yes  Pain : No/denies pain Pain Score: 0-No pain     BMI - recorded: 31.09 Nutritional Status: BMI > 30  Obese Nutritional Risks: None Diabetes: Yes CBG done?: No Did pt. bring in CBG monitor from home?: No  How often do you need to have someone help you when you read instructions, pamphlets, or other written materials from your doctor or pharmacy?: 5 - Always What is the last grade level you completed in school?: never attended school  Interpreter Needed?: No     Past Medical History:  Diagnosis  Date  . 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 Chronic constipation   . Congenital hydrocephalus ([place])   . GERD (gastroesophageal reflux disease)   . Hyperlipidemia   . Hypertension   . Mental retardation   . Seizure disorder ([place])   . Seizures ([place])    Past Surgical History:  Procedure Laterality Date  . COLONOSCOPY  02/08/06   Rourk-mild anal stenosis, multiple erosions o fprox rectum and sigmoid. Long redundant colon. Bx  ?ischemic colitis  . COLONOSCOPY  06/12/2011   Procedure: COLONOSCOPY;  Surgeon: Daneil Dolin, MD;  Location: [facility];  Service: Endoscopy;  Laterality: N/A;  11:00  . ESOPHAGOGASTRODUODENOSCOPY (EGD) WITH PROPOFOL N/A 08/06/2018   Procedure: ESOPHAGOGASTRODUODENOSCOPY (EGD) WITH PROPOFOL;  Surgeon: Rogene Houston, MD;  Location: [facility];  Service: Endoscopy;  Laterality: N/A;  . FLEXIBLE SIGMOIDOSCOPY  08/14/2003   Rourk- Normal-appearing rectum, normal-appearing sigmoid colon to 50  cm.  Relatively poor prep made exam more difficult  . patient reports that they had brain surgery attempted for hydrocephalus in infancy. However , the procedure was aborted because she coded. .     Family History  Problem Relation Age of Onset  . Kidney failure Mother        on dialysis  . Hypertension Mother   . Kidney disease Mother   .  Prostate cancer Father   . Hypertension Father   . Glaucoma Father   . Colon cancer Sister 8  . Multiple myeloma Sister   . Hypertension Sister   . Glaucoma Sister   . Allergies Sister   . Hypertension Sister   . Glaucoma Sister   . Hyperlipidemia Sister   . Hypertension Brother   . Allergies Brother   . Hyperlipidemia Brother   . Colon cancer Other        family history   . Lymphoma Other        family history    Social History   Socioeconomic History  . Marital status: Single    Spouse name: Not on file  . Number of children: 0  . Years of education: Not on file  . Highest education  level: Not on file  Occupational History  . Occupation: disabled   Social Needs  . Financial resource strain: Not hard at all  . Food insecurity    Worry: Patient refused    Inability: Patient refused  . Transportation needs    Medical: Patient refused    Non-medical: Patient refused  Tobacco Use  . Smoking status: Never Smoker  . Smokeless tobacco: Never Used  Substance and Sexual Activity  . Alcohol use: No  . Drug use: No  . Sexual activity: Never  Lifestyle  . Physical activity    Days per week: Patient refused    Minutes per session: Patient refused  . Stress: Not at all  Relationships  . Social Herbalist on phone: Patient refused    Gets together: Patient refused    Attends religious service: Patient refused    Active member of club or organization: Patient refused    Attends meetings of clubs or organizations: Patient refused    Relationship status: Patient refused  Other Topics Concern  . Not on file  Social History Narrative   Sisters assist w/ pt care    Outpatient Encounter Medications as of 10/27/2018  Medication Sig  . benzonatate (TESSALON PERLES) 100 MG capsule Take 1 capsule (100 mg total) by mouth 3 (three) times daily as needed for cough.  . benzonatate (TESSALON) 100 MG capsule Take by mouth 2 (two) times daily as needed for cough.  . bisacodyl (DULCOLAX) 10 MG suppository Place 1 suppository (10 mg total) rectally as needed for moderate constipation.  . diphenoxylate-atropine (LOMOTIL) 2.5-0.025 MG tablet TAKE ONE TABLET TWO TIMES DAILY, AS NEEDED, FOR CONSTIPATION  . docusate sodium (STOOL SOFTENER) 100 MG capsule TAKE 1 CAPSULE TWICE DAILY FOR CONSTIPATION. (Patient taking differently: Take 100 mg by mouth 2 (two) times daily. )  . linaclotide (LINZESS) 145 MCG CAPS capsule Take 1 capsule (145 mcg total) by mouth daily before breakfast.  . linaclotide (LINZESS) 145 MCG CAPS capsule Take 1 capsule (145 mcg total) by mouth daily before  breakfast.  . montelukast (SINGULAIR) 10 MG tablet Take 1 tablet (10 mg total) by mouth at bedtime.  . ondansetron (ZOFRAN) 4 MG tablet Take 1 tablet (4 mg total) by mouth every 8 (eight) hours as needed for nausea or vomiting.  . pantoprazole (PROTONIX) 40 MG tablet Take 1 tablet (40 mg total) by mouth daily.  Marland Kitchen PHENobarbital (LUMINAL) 32.4 MG tablet Take 1 tablet (32.4 mg total) by mouth 2 (two) times daily. FOR SEIZURE DISORDER.  Marland Kitchen polyethylene glycol powder (GLYCOLAX/MIRALAX) 17 GM/SCOOP powder Take 17 g by mouth 2 (two) times daily as needed.  . potassium chloride (K-DUR)  10 MEQ tablet Take one tablet by mouth three times daily  . simvastatin (ZOCOR) 10 MG tablet TAKE (1) TABLET BY MOUTH AT BEDTIME FOR CHOLESTEROL. (Patient taking differently: Take 10 mg by mouth at bedtime. )  . UNABLE TO FIND Underpads- use as needed daily Pullups- use as needed daily DX: incontinence  . UNABLE TO FIND Underpads use daily as needed for incontinence  Pull ups- use as needed daily  Ensure- as needed  . Vitamin D, Ergocalciferol, (DRISDOL) 1.25 MG (50000 UT) CAPS capsule TAKE 1 CAPSULE BY MOUTH EVERY 2 WEEKS. (Patient taking differently: Take 50,000 Units by mouth every 14 (fourteen) days. )   No facility-administered encounter medications on file as of 10/27/2018.     Activities of Daily Living In your present state of health, do you have any difficulty performing the following activities: 10/27/2018 08/03/2018  Hearing? N -  Vision? N -  Difficulty concentrating or making decisions? Y -  Comment - -  Walking or climbing stairs? Y -  Comment - -  Dressing or bathing? Y -  Doing errands, shopping? [place] and eating ? Y -  Using the Toilet? Y -  In the past six months, have you accidently leaked urine? Y -  Do you have problems with loss of bowel control? Y -  Managing your Medications? Y -  Managing your Finances? Y -  Housekeeping or managing your Housekeeping? Y -   Some recent data might be hidden    Patient Care Team: Fayrene Helper, MD as PCP - General Gala Romney Cristopher Estimable, MD (Gastroenterology)    Assessment:   This is a routine wellness examination for Juaquina.  Exercise Activities and Dietary recommendations Current Exercise Habits: The patient does not participate in regular exercise at present, Exercise limited by: neurologic condition(s)  Goals   None     Fall Risk Fall Risk  10/27/2018 08/16/2018 07/20/2018 04/06/2018 10/27/2017  Falls in the past year? 1 Exclusion - non ambulatory Exclusion - non ambulatory 0 No  Number falls in past yr: 1 - - - -  Injury with Fall? 1 - - 0 -  Risk for fall due to : - - - - -  Follow up - - - - -   Is the patient's home free of loose throw rugs in walkways, pet beds, electrical cords, etc?   yes      Grab bars in the bathroom? yes      Handrails on the stairs?   no      Adequate lighting?   yes     Depression Screen PHQ 2/9 Scores 10/27/2018 10/27/2017 04/08/2017 04/08/2017  PHQ - 2 Score 0 - - -  Exception Documentation - Medical reason Medical reason Medical reason  Not completed - - - -     Cognitive Function MMSE - Mini Mental State Exam 10/14/2016  Not completed: Unable to complete        Immunization History  Administered Date(s) Administered  . H1N1 12/23/2007, 02/22/2008  . Influenza Split 10/24/2010, 11/16/2011, 11/04/2013  . Influenza Whole 10/18/2009  . Influenza,inj,Quad PF,6+ Mos 10/17/2012, 12/19/2014, 10/09/2015, 09/08/2016, 10/25/2017, 09/20/2018  . Pneumococcal Conjugate-13 06/19/2014  . Pneumococcal Polysaccharide-23 10/14/2016  . Td 05/19/2006    Qualifies for Shingles Vaccine?   Screening Tests Health Maintenance  Topic Date Due  . Hepatitis C Screening  1960/01/24  . OPHTHALMOLOGY EXAM  12/30/1969  . URINE MICROALBUMIN  12/30/1969  . MAMMOGRAM  12/30/2009  . FOOT EXAM  12/19/2015  . PAP SMEAR-Modifier  11/15/2020 (Originally 12/30/1980)  . TETANUS/TDAP   05/17/2026 (Originally 05/18/2016)  . HEMOGLOBIN A1C  03/09/2019  . COLONOSCOPY  06/11/2021  . INFLUENZA VACCINE  Completed  . PNEUMOCOCCAL POLYSACCHARIDE VACCINE AGE 64-64 HIGH RISK  Completed  . HIV Screening  Completed    Cancer Screenings: Lung: Low Dose CT Chest recommended if Age 57-80 years, 30 pack-year currently smoking OR have quit w/in 15years. Patient does not qualify. Breast:  Up to date on Mammogram? No able to get due to being homebound  Up to date of Bone Density/Dexa? n/a Colorectal:  Due 2023  Additional Screenings:   Hepatitis C Screening:      Plan:     1. Encounter for Medicare annual wellness exam  I have personally reviewed and noted the following in the patient's chart:   . Medical and social history . Use of alcohol, tobacco or illicit drugs  . Current medications and supplements . Functional ability and status . Nutritional status . Physical activity . Advanced directives . List of other physicians . Hospitalizations, surgeries, and ER visits in previous 12 months . Vitals . Screenings to include cognitive, depression, and falls . Referrals and appointments  In addition, I have reviewed and discussed with patient certain preventive protocols, quality metrics, and best practice recommendations. A written personalized care plan for preventive services as well as general preventive health recommendations were provided to patient.   I provided 20 minutes of non-face-to-face time during this encounter.   Perlie Mayo, NP  10/27/2018

## 2018-10-28 DIAGNOSIS — L02212 Cutaneous abscess of back [any part, except buttock]: Secondary | ICD-10-CM | POA: Diagnosis not present

## 2018-10-28 DIAGNOSIS — B966 Bacteroides fragilis [B. fragilis] as the cause of diseases classified elsewhere: Secondary | ICD-10-CM | POA: Diagnosis not present

## 2018-10-28 DIAGNOSIS — Z4800 Encounter for change or removal of nonsurgical wound dressing: Secondary | ICD-10-CM | POA: Diagnosis not present

## 2018-10-28 DIAGNOSIS — L89152 Pressure ulcer of sacral region, stage 2: Secondary | ICD-10-CM | POA: Diagnosis not present

## 2018-10-28 DIAGNOSIS — L03312 Cellulitis of back [any part except buttock]: Secondary | ICD-10-CM | POA: Diagnosis not present

## 2018-10-28 DIAGNOSIS — L89143 Pressure ulcer of left lower back, stage 3: Secondary | ICD-10-CM | POA: Diagnosis not present

## 2018-10-31 DIAGNOSIS — B966 Bacteroides fragilis [B. fragilis] as the cause of diseases classified elsewhere: Secondary | ICD-10-CM | POA: Diagnosis not present

## 2018-10-31 DIAGNOSIS — L03312 Cellulitis of back [any part except buttock]: Secondary | ICD-10-CM | POA: Diagnosis not present

## 2018-10-31 DIAGNOSIS — Z4800 Encounter for change or removal of nonsurgical wound dressing: Secondary | ICD-10-CM | POA: Diagnosis not present

## 2018-10-31 DIAGNOSIS — L89143 Pressure ulcer of left lower back, stage 3: Secondary | ICD-10-CM | POA: Diagnosis not present

## 2018-10-31 DIAGNOSIS — L02212 Cutaneous abscess of back [any part, except buttock]: Secondary | ICD-10-CM | POA: Diagnosis not present

## 2018-10-31 DIAGNOSIS — L89152 Pressure ulcer of sacral region, stage 2: Secondary | ICD-10-CM | POA: Diagnosis not present

## 2018-11-02 DIAGNOSIS — L89152 Pressure ulcer of sacral region, stage 2: Secondary | ICD-10-CM | POA: Diagnosis not present

## 2018-11-02 DIAGNOSIS — L02212 Cutaneous abscess of back [any part, except buttock]: Secondary | ICD-10-CM | POA: Diagnosis not present

## 2018-11-02 DIAGNOSIS — Z4800 Encounter for change or removal of nonsurgical wound dressing: Secondary | ICD-10-CM | POA: Diagnosis not present

## 2018-11-02 DIAGNOSIS — L89143 Pressure ulcer of left lower back, stage 3: Secondary | ICD-10-CM | POA: Diagnosis not present

## 2018-11-02 DIAGNOSIS — L03312 Cellulitis of back [any part except buttock]: Secondary | ICD-10-CM | POA: Diagnosis not present

## 2018-11-02 DIAGNOSIS — B966 Bacteroides fragilis [B. fragilis] as the cause of diseases classified elsewhere: Secondary | ICD-10-CM | POA: Diagnosis not present

## 2018-11-04 DIAGNOSIS — L03312 Cellulitis of back [any part except buttock]: Secondary | ICD-10-CM | POA: Diagnosis not present

## 2018-11-04 DIAGNOSIS — L89143 Pressure ulcer of left lower back, stage 3: Secondary | ICD-10-CM | POA: Diagnosis not present

## 2018-11-04 DIAGNOSIS — L02212 Cutaneous abscess of back [any part, except buttock]: Secondary | ICD-10-CM | POA: Diagnosis not present

## 2018-11-04 DIAGNOSIS — B966 Bacteroides fragilis [B. fragilis] as the cause of diseases classified elsewhere: Secondary | ICD-10-CM | POA: Diagnosis not present

## 2018-11-04 DIAGNOSIS — L89152 Pressure ulcer of sacral region, stage 2: Secondary | ICD-10-CM | POA: Diagnosis not present

## 2018-11-04 DIAGNOSIS — Z4800 Encounter for change or removal of nonsurgical wound dressing: Secondary | ICD-10-CM | POA: Diagnosis not present

## 2018-11-08 ENCOUNTER — Telehealth: Payer: Self-pay | Admitting: *Deleted

## 2018-11-08 DIAGNOSIS — L03312 Cellulitis of back [any part except buttock]: Secondary | ICD-10-CM | POA: Diagnosis not present

## 2018-11-08 DIAGNOSIS — Z4800 Encounter for change or removal of nonsurgical wound dressing: Secondary | ICD-10-CM | POA: Diagnosis not present

## 2018-11-08 DIAGNOSIS — L89152 Pressure ulcer of sacral region, stage 2: Secondary | ICD-10-CM | POA: Diagnosis not present

## 2018-11-08 DIAGNOSIS — L02212 Cutaneous abscess of back [any part, except buttock]: Secondary | ICD-10-CM | POA: Diagnosis not present

## 2018-11-08 DIAGNOSIS — B966 Bacteroides fragilis [B. fragilis] as the cause of diseases classified elsewhere: Secondary | ICD-10-CM | POA: Diagnosis not present

## 2018-11-08 DIAGNOSIS — L89143 Pressure ulcer of left lower back, stage 3: Secondary | ICD-10-CM | POA: Diagnosis not present

## 2018-11-08 NOTE — Telephone Encounter (Signed)
[name] with [facility] called about pt wants to decrease skilled nursing to two times a week due to the daughter being taught how to change the dressings daily and apply medihoney to skin break down on right upper thigh from moisture. She can be reached at [phone]

## 2018-11-08 NOTE — Telephone Encounter (Signed)
Verbal orders given to decrease skilled nursing as requested

## 2018-11-09 ENCOUNTER — Other Ambulatory Visit (HOSPITAL_COMMUNITY)
Admission: AD | Admit: 2018-11-09 | Discharge: 2018-11-09 | Disposition: A | Discharge status: Home / Self Care | Payer: Medicare Other | Source: Skilled Nursing Facility | Attending: Family Medicine | Admitting: Family Medicine

## 2018-11-09 ENCOUNTER — Telehealth: Payer: Self-pay | Admitting: *Deleted

## 2018-11-09 DIAGNOSIS — N3001 Acute cystitis with hematuria: Secondary | ICD-10-CM

## 2018-11-09 LAB — URINALYSIS, COMPLETE (UACMP) WITH MICROSCOPIC
Glucose, UA: NEGATIVE mg/dL
Hgb urine dipstick: NEGATIVE
Ketones, ur: NEGATIVE mg/dL
Nitrite: NEGATIVE
Protein, ur: >=300 mg/dL — AB
Specific Gravity, Urine: 1.018 (ref 1.005–1.030)
pH: 9 — ABNORMAL HIGH (ref 5.0–8.0)

## 2018-11-09 NOTE — Telephone Encounter (Signed)
Yes pls UA and c/s ,  thanks

## 2018-11-09 NOTE — Telephone Encounter (Signed)
April Watson (on [place])  called about April Watson said her urine had a foul order to it. Wanted to know if she could collect a urine to be tested. Said she would come by and collect the specimen cup and bring back.

## 2018-11-09 NOTE — Telephone Encounter (Signed)
Is this ok with you  ?

## 2018-11-09 NOTE — Telephone Encounter (Signed)
UA ans c/s sent

## 2018-11-10 ENCOUNTER — Other Ambulatory Visit: Payer: Self-pay | Admitting: Family Medicine

## 2018-11-10 ENCOUNTER — Encounter: Payer: Self-pay | Admitting: General Surgery

## 2018-11-10 ENCOUNTER — Other Ambulatory Visit: Payer: Self-pay

## 2018-11-10 ENCOUNTER — Ambulatory Visit (INDEPENDENT_AMBULATORY_CARE_PROVIDER_SITE_OTHER): Payer: Medicare Other | Admitting: General Surgery

## 2018-11-10 VITALS — BP 98/73 | HR 123 | Temp 96.6°F | Resp 18

## 2018-11-10 DIAGNOSIS — L89102 Pressure ulcer of unspecified part of back, stage 2: Secondary | ICD-10-CM | POA: Diagnosis not present

## 2018-11-10 DIAGNOSIS — L89151 Pressure ulcer of sacral region, stage 1: Secondary | ICD-10-CM | POA: Diagnosis not present

## 2018-11-10 NOTE — Patient Instructions (Signed)
Continue dressing changes as you have been doing.

## 2018-11-10 NOTE — Progress Notes (Signed)
[facility] Note   HPI:  58 y.o. Female presents to clinic for follow-up evaluation of the left flank decubitus wound. Doing well overall. Family happy with progress.  Review of Systems:  No fever or chills Some drainage All other review of systems: otherwise negative   Vital Signs:  BP 98/73 (BP Location: Left Arm, Patient Position: Sitting, Cuff Size: Normal)   Pulse (!) 123   Temp (!) 96.6 F (35.9 C) (Tympanic)   Resp 18   SpO2 97%    Physical Exam:  Physical Exam Vitals signs reviewed.  Cardiovascular:     Rate and Rhythm: Normal rate.  Pulmonary:     Effort: Pulmonary effort is normal.  Musculoskeletal:     Comments: Gluteal and sacral region with some stage I pressure ulcers, controlled, no signs of drainage or infection, left flank with healing decubitus ulcer, granulation and some epithelization on the edges, rib is covered with granulation now       Assessment:  58 y.o. yo Female with a healing chronic left flank decubitus ulcer s/p debridement and wound care and sacral/ gluteal stage I decubitus wounds. Overall patient is doing well and is receiving great care. Family is doing frequent turns, air bed, and padding. RN is seeing patient 2 times a week. Alginate dressings are being used. Wounds are chronic and will take months to heal, and may not fully heal given patient's chronic state of bedrest.   Plan:  - Continue wound care  - Encourage protein intake   Future Appointments  Date Time Provider [place]  12/28/2018 10:20 AM Fayrene Helper, MD [facility] R[facility]  01/11/2019  3:00 PM Virl Cagey, MD [facility] None  10/31/2019  1:00 PM Perlie Mayo, NP [facility]    All of the above recommendations were discussed with the patient and patient's family, and all of patient's and family's questions were answered to their expressed satisfaction. I spent 25 minutes with the family and patient change her wounds and replacing the  dressings.   Curlene Labrum, MD [facility] [address] [place], [place] [address] [phone] (office)

## 2018-11-11 DIAGNOSIS — L02212 Cutaneous abscess of back [any part, except buttock]: Secondary | ICD-10-CM | POA: Diagnosis not present

## 2018-11-11 DIAGNOSIS — Z4800 Encounter for change or removal of nonsurgical wound dressing: Secondary | ICD-10-CM | POA: Diagnosis not present

## 2018-11-11 DIAGNOSIS — L89143 Pressure ulcer of left lower back, stage 3: Secondary | ICD-10-CM | POA: Diagnosis not present

## 2018-11-11 DIAGNOSIS — L03312 Cellulitis of back [any part except buttock]: Secondary | ICD-10-CM | POA: Diagnosis not present

## 2018-11-11 DIAGNOSIS — B966 Bacteroides fragilis [B. fragilis] as the cause of diseases classified elsewhere: Secondary | ICD-10-CM | POA: Diagnosis not present

## 2018-11-11 DIAGNOSIS — L89152 Pressure ulcer of sacral region, stage 2: Secondary | ICD-10-CM | POA: Diagnosis not present

## 2018-11-11 LAB — URINE CULTURE: Culture: >=100000 — AB

## 2018-11-14 DIAGNOSIS — Z4800 Encounter for change or removal of nonsurgical wound dressing: Secondary | ICD-10-CM | POA: Diagnosis not present

## 2018-11-14 DIAGNOSIS — L02212 Cutaneous abscess of back [any part, except buttock]: Secondary | ICD-10-CM | POA: Diagnosis not present

## 2018-11-14 DIAGNOSIS — L89143 Pressure ulcer of left lower back, stage 3: Secondary | ICD-10-CM | POA: Diagnosis not present

## 2018-11-14 DIAGNOSIS — B966 Bacteroides fragilis [B. fragilis] as the cause of diseases classified elsewhere: Secondary | ICD-10-CM | POA: Diagnosis not present

## 2018-11-14 DIAGNOSIS — L03312 Cellulitis of back [any part except buttock]: Secondary | ICD-10-CM | POA: Diagnosis not present

## 2018-11-14 DIAGNOSIS — L89152 Pressure ulcer of sacral region, stage 2: Secondary | ICD-10-CM | POA: Diagnosis not present

## 2018-11-17 ENCOUNTER — Encounter: Payer: Self-pay | Admitting: Family Medicine

## 2018-11-17 ENCOUNTER — Telehealth: Payer: Self-pay

## 2018-11-17 ENCOUNTER — Other Ambulatory Visit: Payer: Self-pay | Admitting: Family Medicine

## 2018-11-17 MED ORDER — CIPROFLOXACIN HCL 500 MG PO TABS: 500.0000 mg | ORAL_TABLET | Freq: Two times a day (BID) | ORAL | 0 refills | Status: DC

## 2018-11-17 NOTE — Telephone Encounter (Signed)
I spoke directly with linda and cipro is prescribed

## 2018-11-17 NOTE — Telephone Encounter (Signed)
I spoke directly with April Watson, calling again re urine culture which does show infection. I apologized for the delay. Cipro x 1 week is prescribed and she will collect

## 2018-11-17 NOTE — Telephone Encounter (Signed)
Please call the [name] to advise of the urine culture

## 2018-11-18 DIAGNOSIS — L89143 Pressure ulcer of left lower back, stage 3: Secondary | ICD-10-CM | POA: Diagnosis not present

## 2018-11-18 DIAGNOSIS — B966 Bacteroides fragilis [B. fragilis] as the cause of diseases classified elsewhere: Secondary | ICD-10-CM | POA: Diagnosis not present

## 2018-11-18 DIAGNOSIS — L03312 Cellulitis of back [any part except buttock]: Secondary | ICD-10-CM | POA: Diagnosis not present

## 2018-11-18 DIAGNOSIS — Z4800 Encounter for change or removal of nonsurgical wound dressing: Secondary | ICD-10-CM | POA: Diagnosis not present

## 2018-11-18 DIAGNOSIS — L02212 Cutaneous abscess of back [any part, except buttock]: Secondary | ICD-10-CM | POA: Diagnosis not present

## 2018-11-18 DIAGNOSIS — L89152 Pressure ulcer of sacral region, stage 2: Secondary | ICD-10-CM | POA: Diagnosis not present

## 2018-11-21 ENCOUNTER — Telehealth: Payer: Self-pay

## 2018-11-21 DIAGNOSIS — L89143 Pressure ulcer of left lower back, stage 3: Secondary | ICD-10-CM | POA: Diagnosis not present

## 2018-11-21 DIAGNOSIS — L03312 Cellulitis of back [any part except buttock]: Secondary | ICD-10-CM | POA: Diagnosis not present

## 2018-11-21 DIAGNOSIS — B966 Bacteroides fragilis [B. fragilis] as the cause of diseases classified elsewhere: Secondary | ICD-10-CM | POA: Diagnosis not present

## 2018-11-21 DIAGNOSIS — Z4800 Encounter for change or removal of nonsurgical wound dressing: Secondary | ICD-10-CM | POA: Diagnosis not present

## 2018-11-21 DIAGNOSIS — L02212 Cutaneous abscess of back [any part, except buttock]: Secondary | ICD-10-CM | POA: Diagnosis not present

## 2018-11-21 DIAGNOSIS — L89152 Pressure ulcer of sacral region, stage 2: Secondary | ICD-10-CM | POA: Diagnosis not present

## 2018-11-21 NOTE — Telephone Encounter (Signed)
Please call with Verbal orders to treat Stage 1 skin breakdown There is no drainage

## 2018-11-22 NOTE — Telephone Encounter (Signed)
Verbal orders given  

## 2018-11-25 DIAGNOSIS — B966 Bacteroides fragilis [B. fragilis] as the cause of diseases classified elsewhere: Secondary | ICD-10-CM | POA: Diagnosis not present

## 2018-11-25 DIAGNOSIS — L02212 Cutaneous abscess of back [any part, except buttock]: Secondary | ICD-10-CM | POA: Diagnosis not present

## 2018-11-25 DIAGNOSIS — Z4800 Encounter for change or removal of nonsurgical wound dressing: Secondary | ICD-10-CM | POA: Diagnosis not present

## 2018-11-25 DIAGNOSIS — L03312 Cellulitis of back [any part except buttock]: Secondary | ICD-10-CM | POA: Diagnosis not present

## 2018-11-25 DIAGNOSIS — L89143 Pressure ulcer of left lower back, stage 3: Secondary | ICD-10-CM | POA: Diagnosis not present

## 2018-11-25 DIAGNOSIS — L89152 Pressure ulcer of sacral region, stage 2: Secondary | ICD-10-CM | POA: Diagnosis not present

## 2018-11-26 DIAGNOSIS — M4185 Other forms of scoliosis, thoracolumbar region: Secondary | ICD-10-CM | POA: Diagnosis not present

## 2018-11-26 DIAGNOSIS — I10 Essential (primary) hypertension: Secondary | ICD-10-CM | POA: Diagnosis not present

## 2018-11-26 DIAGNOSIS — F79 Unspecified intellectual disabilities: Secondary | ICD-10-CM | POA: Diagnosis not present

## 2018-11-26 DIAGNOSIS — E785 Hyperlipidemia, unspecified: Secondary | ICD-10-CM | POA: Diagnosis not present

## 2018-11-26 DIAGNOSIS — L89312 Pressure ulcer of right buttock, stage 2: Secondary | ICD-10-CM | POA: Diagnosis not present

## 2018-11-26 DIAGNOSIS — G8250 Quadriplegia, unspecified: Secondary | ICD-10-CM | POA: Diagnosis not present

## 2018-11-26 DIAGNOSIS — L89892 Pressure ulcer of other site, stage 2: Secondary | ICD-10-CM | POA: Diagnosis not present

## 2018-11-26 DIAGNOSIS — M47819 Spondylosis without myelopathy or radiculopathy, site unspecified: Secondary | ICD-10-CM | POA: Diagnosis not present

## 2018-11-26 DIAGNOSIS — L89322 Pressure ulcer of left buttock, stage 2: Secondary | ICD-10-CM | POA: Diagnosis not present

## 2018-11-26 DIAGNOSIS — B966 Bacteroides fragilis [B. fragilis] as the cause of diseases classified elsewhere: Secondary | ICD-10-CM | POA: Diagnosis not present

## 2018-11-26 DIAGNOSIS — G40909 Epilepsy, unspecified, not intractable, without status epilepticus: Secondary | ICD-10-CM | POA: Diagnosis not present

## 2018-11-26 DIAGNOSIS — K219 Gastro-esophageal reflux disease without esophagitis: Secondary | ICD-10-CM | POA: Diagnosis not present

## 2018-11-26 DIAGNOSIS — R32 Unspecified urinary incontinence: Secondary | ICD-10-CM | POA: Diagnosis not present

## 2018-11-26 DIAGNOSIS — M6284 Sarcopenia: Secondary | ICD-10-CM | POA: Diagnosis not present

## 2018-11-26 DIAGNOSIS — K5989 Other specified functional intestinal disorders: Secondary | ICD-10-CM | POA: Diagnosis not present

## 2018-11-26 DIAGNOSIS — K5909 Other constipation: Secondary | ICD-10-CM | POA: Diagnosis not present

## 2018-11-26 DIAGNOSIS — L89891 Pressure ulcer of other site, stage 1: Secondary | ICD-10-CM | POA: Diagnosis not present

## 2018-11-26 DIAGNOSIS — L89143 Pressure ulcer of left lower back, stage 3: Secondary | ICD-10-CM | POA: Diagnosis not present

## 2018-11-26 DIAGNOSIS — Z9181 History of falling: Secondary | ICD-10-CM | POA: Diagnosis not present

## 2018-11-26 DIAGNOSIS — Z4800 Encounter for change or removal of nonsurgical wound dressing: Secondary | ICD-10-CM | POA: Diagnosis not present

## 2018-11-26 DIAGNOSIS — Q039 Congenital hydrocephalus, unspecified: Secondary | ICD-10-CM | POA: Diagnosis not present

## 2018-11-26 DIAGNOSIS — L02212 Cutaneous abscess of back [any part, except buttock]: Secondary | ICD-10-CM | POA: Diagnosis not present

## 2018-11-26 DIAGNOSIS — E1149 Type 2 diabetes mellitus with other diabetic neurological complication: Secondary | ICD-10-CM | POA: Diagnosis not present

## 2018-11-26 DIAGNOSIS — I051 Rheumatic mitral insufficiency: Secondary | ICD-10-CM | POA: Diagnosis not present

## 2018-11-26 DIAGNOSIS — K8020 Calculus of gallbladder without cholecystitis without obstruction: Secondary | ICD-10-CM | POA: Diagnosis not present

## 2018-11-28 DIAGNOSIS — Z4800 Encounter for change or removal of nonsurgical wound dressing: Secondary | ICD-10-CM | POA: Diagnosis not present

## 2018-11-28 DIAGNOSIS — B966 Bacteroides fragilis [B. fragilis] as the cause of diseases classified elsewhere: Secondary | ICD-10-CM | POA: Diagnosis not present

## 2018-11-28 DIAGNOSIS — L89143 Pressure ulcer of left lower back, stage 3: Secondary | ICD-10-CM | POA: Diagnosis not present

## 2018-11-28 DIAGNOSIS — L02212 Cutaneous abscess of back [any part, except buttock]: Secondary | ICD-10-CM | POA: Diagnosis not present

## 2018-11-28 DIAGNOSIS — L89892 Pressure ulcer of other site, stage 2: Secondary | ICD-10-CM | POA: Diagnosis not present

## 2018-11-28 DIAGNOSIS — L89312 Pressure ulcer of right buttock, stage 2: Secondary | ICD-10-CM | POA: Diagnosis not present

## 2018-11-29 ENCOUNTER — Other Ambulatory Visit: Payer: Self-pay | Admitting: Family Medicine

## 2018-11-30 ENCOUNTER — Telehealth: Payer: Self-pay | Admitting: *Deleted

## 2018-11-30 ENCOUNTER — Encounter: Payer: Self-pay | Admitting: General Surgery

## 2018-11-30 ENCOUNTER — Encounter: Payer: Self-pay | Admitting: Family Medicine

## 2018-11-30 ENCOUNTER — Other Ambulatory Visit: Payer: Self-pay | Admitting: Family Medicine

## 2018-11-30 DIAGNOSIS — L89309 Pressure ulcer of unspecified buttock, unspecified stage: Secondary | ICD-10-CM

## 2018-11-30 NOTE — Telephone Encounter (Signed)
Dr Constance Haw has been managing her wounds and a messag was sent by fmily reqesting appt with her. PPls f/ui with Edwena Blow and see if an appt can be made with Dr Algernon Huxley urgently and clarify family members wishes for who is to f/u before looking at [facility] per [facility]

## 2018-11-30 NOTE — Telephone Encounter (Signed)
April Watson with [facility] called stated that pts sister called her to let her know the wound on her upper leg is worse and wanted to know if pt could get a referral to a wound clinic in [place]. April Watson can be reached at [phone]

## 2018-12-01 ENCOUNTER — Other Ambulatory Visit: Payer: Self-pay

## 2018-12-01 ENCOUNTER — Ambulatory Visit (INDEPENDENT_AMBULATORY_CARE_PROVIDER_SITE_OTHER): Payer: Medicare Other | Admitting: General Surgery

## 2018-12-01 ENCOUNTER — Encounter: Payer: Self-pay | Admitting: General Surgery

## 2018-12-01 VITALS — BP 128/85 | HR 113 | Temp 96.6°F | Resp 20

## 2018-12-01 DIAGNOSIS — L89102 Pressure ulcer of unspecified part of back, stage 2: Secondary | ICD-10-CM

## 2018-12-01 DIAGNOSIS — L89322 Pressure ulcer of left buttock, stage 2: Secondary | ICD-10-CM

## 2018-12-01 DIAGNOSIS — L89151 Pressure ulcer of sacral region, stage 1: Secondary | ICD-10-CM | POA: Diagnosis not present

## 2018-12-01 NOTE — Telephone Encounter (Signed)
Patient has appt and family aware per [facility]

## 2018-12-01 NOTE — Progress Notes (Signed)
[facility]   HPI:  58 y.o. Female presents to clinic for follow-up evaluation of her left flank and ischial decubitus wounds. Her sister was worried for worsening wounds on the left ischium. She brought the patient in to be evaluated.   Review of Systems:  Minor drainage Worsening skin changes on ischium on left  All other review of systems: otherwise negative   Vital Signs:  BP 128/85 (BP Location: Left Arm, Patient Position: Sitting, Cuff Size: Normal)   Pulse (!) 113   Temp (!) 96.6 F (35.9 C) (Tympanic)   Resp 20   SpO2 97%    Physical Exam:  Physical Exam Vitals signs reviewed.  Musculoskeletal:     Comments: Left flank wound healing with granulation, shrinking in size, some epithelization, calcium alginate replaced  Multiple superficial Stage I pressure sores of sacrum and left ischial region, necrotic looking eschar without drainage, unroofed and some minor drainage and necrotic tissue removed, Stage II ischial pressure sore on left, packed with saline gauze     Assessment:  58 y.o. yo Female with chronic wounds from pressure, contracture and immobility. Patient is handicap, contractured. She is cared for by her family who does an excellent job. She has been losing hair and still is not gaining significant amounts of weight. Overall, I think she could be in the declining/ dying process as her body continues to get weaker. The family is aware of this but this is hard for them to hear.   Plan:  Saline dampened gauze to deeper area on buttock daily. Continue remaining dressing changes as you are doing.  Future Appointments  Date Time Provider [place]  12/28/2018 10:20 AM Fayrene Helper, MD [facility]  01/11/2019  3:00 PM Virl Cagey, MD RS-RS None  10/31/2019  1:00 PM Perlie Mayo, NP [facility] RPC    All of the above recommendations were discussed with the patient and patient's family, and all of patient's and family's  questions were answered to their expressed satisfaction.  Curlene Labrum, MD [facility] [address] [place], [place] [address] [phone] (office)

## 2018-12-01 NOTE — Patient Instructions (Signed)
Saline dampened gauze to deeper area on buttock daily. Continue remaining dressing changes as you are doing.

## 2018-12-02 DIAGNOSIS — L89322 Pressure ulcer of left buttock, stage 2: Secondary | ICD-10-CM | POA: Insufficient documentation

## 2018-12-02 DIAGNOSIS — L02212 Cutaneous abscess of back [any part, except buttock]: Secondary | ICD-10-CM | POA: Diagnosis not present

## 2018-12-02 DIAGNOSIS — L89892 Pressure ulcer of other site, stage 2: Secondary | ICD-10-CM | POA: Diagnosis not present

## 2018-12-02 DIAGNOSIS — L89143 Pressure ulcer of left lower back, stage 3: Secondary | ICD-10-CM | POA: Diagnosis not present

## 2018-12-02 DIAGNOSIS — Z4800 Encounter for change or removal of nonsurgical wound dressing: Secondary | ICD-10-CM | POA: Diagnosis not present

## 2018-12-02 DIAGNOSIS — B966 Bacteroides fragilis [B. fragilis] as the cause of diseases classified elsewhere: Secondary | ICD-10-CM | POA: Diagnosis not present

## 2018-12-02 DIAGNOSIS — L89312 Pressure ulcer of right buttock, stage 2: Secondary | ICD-10-CM | POA: Diagnosis not present

## 2018-12-03 ENCOUNTER — Encounter (HOSPITAL_COMMUNITY): Payer: Self-pay

## 2018-12-03 ENCOUNTER — Emergency Department (HOSPITAL_COMMUNITY): Payer: Medicare Other

## 2018-12-03 ENCOUNTER — Other Ambulatory Visit: Payer: Self-pay

## 2018-12-03 ENCOUNTER — Emergency Department (HOSPITAL_COMMUNITY)
Admission: EM | Admit: 2018-12-03 | Discharge: 2018-12-03 | Disposition: A | Discharge status: Home / Self Care | Payer: Medicare Other | Attending: Emergency Medicine | Admitting: Emergency Medicine

## 2018-12-03 DIAGNOSIS — F99 Mental disorder, not otherwise specified: Secondary | ICD-10-CM | POA: Diagnosis not present

## 2018-12-03 DIAGNOSIS — T17920A Food in respiratory tract, part unspecified causing asphyxiation, initial encounter: Secondary | ICD-10-CM | POA: Diagnosis not present

## 2018-12-03 DIAGNOSIS — Z79899 Other long term (current) drug therapy: Secondary | ICD-10-CM | POA: Insufficient documentation

## 2018-12-03 DIAGNOSIS — I959 Hypotension, unspecified: Secondary | ICD-10-CM | POA: Diagnosis not present

## 2018-12-03 DIAGNOSIS — R1310 Dysphagia, unspecified: Secondary | ICD-10-CM | POA: Diagnosis not present

## 2018-12-03 DIAGNOSIS — R000 Tachycardia, unspecified: Secondary | ICD-10-CM | POA: Diagnosis not present

## 2018-12-03 DIAGNOSIS — R05 Cough: Secondary | ICD-10-CM | POA: Diagnosis not present

## 2018-12-03 DIAGNOSIS — I10 Essential (primary) hypertension: Secondary | ICD-10-CM | POA: Diagnosis not present

## 2018-12-03 DIAGNOSIS — E119 Type 2 diabetes mellitus without complications: Secondary | ICD-10-CM | POA: Insufficient documentation

## 2018-12-03 DIAGNOSIS — J988 Other specified respiratory disorders: Secondary | ICD-10-CM | POA: Diagnosis not present

## 2018-12-03 MED ORDER — CETIRIZINE HCL 10 MG PO TABS: 10.0000 mg | ORAL_TABLET | Freq: Every day | ORAL | 0 refills | Status: AC

## 2018-12-03 NOTE — ED Notes (Signed)
Respiratory in room.

## 2018-12-03 NOTE — ED Notes (Signed)
Per EMS pt needs to be deep sucitoned

## 2018-12-03 NOTE — ED Triage Notes (Signed)
Pt total care patient from home. C/o phlegm stuck in throat. Family dialed for ems, also caregivers. Not present at bedside. Pt is non-verbal vss with ems.

## 2018-12-03 NOTE — ED Notes (Signed)
X ray in room.

## 2018-12-03 NOTE — ED Notes (Signed)
Respiratory called to assess patient  

## 2018-12-03 NOTE — Discharge Instructions (Addendum)
Call your doctor Monday to arrange to have suction available in the house

## 2018-12-03 NOTE — ED Notes (Signed)
Pt family at bedside

## 2018-12-03 NOTE — ED Notes (Addendum)
Per family, pt is unable to spontaneously cough up her secretions. They attempt to make her cough with a rag to help get out the secretions but unable to get the most of it out. Family also states that they have been giving her mucinex. This has been going on a couple weeks Stated that they do get home health care and will tell them they need to get home suction

## 2018-12-03 NOTE — ED Notes (Signed)
Pt family states that pt seems to be doing better after the suctioning.

## 2018-12-04 ENCOUNTER — Encounter: Payer: Self-pay | Admitting: Family Medicine

## 2018-12-04 NOTE — ED Provider Notes (Signed)
[facility] EMERGENCY DEPARTMENT Provider Note   CSN: [id number] Arrival date & time: 12/03/18  1853     History   Chief Complaint Chief Complaint  Patient presents with  . Dysphagia    HPI April Watson is a 58 y.o. female.     Patient is a quadriplegic and her sister take care of her and was concerned about the congestion she said.  The history is provided by a relative. No language interpreter was used.  Weakness Severity:  Mild Onset quality:  Sudden Timing:  Constant Progression:  Waxing and waning Chronicity:  New Relieved by:  Nothing Associated symptoms: no abdominal pain, no chest pain, no cough, no diarrhea, no frequency, no headaches and no seizures     Past Medical History:  Diagnosis Date  . 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 Chronic constipation   . Congenital hydrocephalus ([place])   . GERD (gastroesophageal reflux disease)   . Hyperlipidemia   . Hypertension   . Mental retardation   . Seizure disorder ([place])   . Seizures [facility])     Patient Active Problem List   Diagnosis Date Noted  . Decubitus ulcer of back, stage 2 ([place]) 12/02/2018  . Hypotension 09/19/2018  . Pseudo-obstruction of colon   . Anemia   . Hypokalemia 08/03/2018  . Poor appetite 08/02/2018  . Decubitus ulcer of back, stage 3 ([place])   . Decubitus ulcer of left ischial area, stage II ([place])   . Abscess   . Abdominal distension 07/20/2018  . Weakness 07/20/2018  . Seizure disorder ([place])   . Sacral ulcer, limited to breakdown of skin ([place]) 04/06/2018  . Dermatomycosis 04/06/2018  . Quadriplegia and quadriparesis ([place]) 09/11/2016  . Decubitus ulcer of sacral region, stage 1 08/22/2015  . Tinea cruris 06/05/2015  . Vitamin D deficiency 01/21/2014  . 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 Allergic cough 11/17/2012  . Family history of colon cancer 05/19/2011  . Type 2 diabetes mellitus with neurological complications ([place]) 29/24/4628  . COLONIC POLYPS,  ADENOMATOUS, HX OF 11/30/2008  . GERD 11/29/2008  . Hyperlipidemia LDL goal <100 08/17/2007  . Chronic constipation 08/17/2007  . 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 Convulsions ([place]) 08/17/2007    Past Surgical History:  Procedure Laterality Date  . COLONOSCOPY  02/08/06   [place]k-mild anal stenosis, multiple erosions o fprox rectum and sigmoid. Long redundant colon. Bx  ?ischemic colitis  . COLONOSCOPY  06/12/2011   Procedure: COLONOSCOPY;  Surgeon: Daneil Dolin, MD;  Location: [facility] ENDO SUITE;  Service: Endoscopy;  Laterality: N/A;  11:00  . ESOPHAGOGASTRODUODENOSCOPY (EGD) WITH PROPOFOL N/A 08/06/2018   Procedure: ESOPHAGOGASTRODUODENOSCOPY (EGD) WITH PROPOFOL;  Surgeon: Rogene Houston, MD;  Location: [facility] ENDO SUITE;  Service: Endoscopy;  Laterality: N/A;  . FLEXIBLE SIGMOIDOSCOPY  08/14/2003   Rourk- Normal-appearing rectum, normal-appearing sigmoid colon to 50  cm.  Relatively poor prep made exam more difficult  . patient reports that they had brain surgery attempted for hydrocephalus in infancy. However , the procedure was aborted because she coded. .       OB History   No obstetric history on file.      Home Medications    Prior to Admission medications   Medication Sig Start Date End Date Taking? Authorizing Provider  benzonatate (TESSALON) 100 MG capsule Take by mouth 2 (two) times daily as needed for cough.    [provider]  benzonatate (TESSALON) 100 MG capsule TAKE 1 CAPSULE  BY MOUTH THREE TIMES DAILY AS NEEDED FOR COUGH. 11/10/18   Perlie Mayo, NP  bisacodyl (DULCOLAX) 10 MG suppository Place 1 suppository (10 mg total) rectally as needed for moderate constipation. 08/07/18   Kathie Dike, MD  cetirizine (ZYRTEC ALLERGY) 10 MG tablet Take 1 tablet (10 mg total) by mouth daily. 12/03/18   Milton Ferguson, MD  ciprofloxacin (CIPRO) 500 MG tablet Take 1 tablet (500 mg total) by mouth 2 (two) times daily. 11/17/18   Fayrene Helper, MD   diphenoxylate-atropine (LOMOTIL) 2.5-0.025 MG tablet TAKE ONE TABLET TWO TIMES DAILY, AS NEEDED, FOR CONSTIPATION 08/10/18   Fayrene Helper, MD  docusate sodium (STOOL SOFTENER) 100 MG capsule TAKE 1 CAPSULE TWICE DAILY FOR CONSTIPATION. Patient taking differently: Take 100 mg by mouth 2 (two) times daily.  06/19/14   Fayrene Helper, MD  linaclotide [facility]) 145 MCG CAPS capsule Take 1 capsule (145 mcg total) by mouth daily before breakfast. 08/08/18   Kathie Dike, MD  linaclotide [facility]) 145 MCG CAPS capsule Take 1 capsule (145 mcg total) by mouth daily before breakfast. 08/30/18   Fayrene Helper, MD  montelukast (SINGULAIR) 10 MG tablet Take 1 tablet (10 mg total) by mouth at bedtime. 04/06/18   Fayrene Helper, MD  ondansetron (ZOFRAN) 4 MG tablet Take 1 tablet (4 mg total) by mouth every 8 (eight) hours as needed for nausea or vomiting. 08/11/18   Fayrene Helper, MD  pantoprazole (PROTONIX) 40 MG tablet Take 1 tablet (40 mg total) by mouth daily. 08/30/18   Fayrene Helper, MD  PHENobarbital (LUMINAL) 32.4 MG tablet Take 1 tablet (32.4 mg total) by mouth 2 (two) times daily. FOR SEIZURE DISORDER. 09/15/18   Fayrene Helper, MD  polyethylene glycol powder (GLYCOLAX/MIRALAX) 17 GM/SCOOP powder Take 17 g by mouth 2 (two) times daily as needed. 10/12/18   Fayrene Helper, MD  potassium chloride (KLOR-CON) 10 MEQ tablet TAKE (1) TABLET BY MOUTH THREE TIMES DAILY. 11/29/18   Fayrene Helper, MD  simvastatin (ZOCOR) 10 MG tablet TAKE (1) TABLET BY MOUTH AT BEDTIME FOR CHOLESTEROL. Patient taking differently: Take 10 mg by mouth at bedtime.  04/28/18   Fayrene Helper, MD  UNABLE TO FIND Underpads- use as needed daily Pullups- use as needed daily DX: incontinence 09/29/18   Fayrene Helper, MD  UNABLE TO FIND Underpads use daily as needed for incontinence  Pull ups- use as needed daily  Ensure- as needed 10/12/18   Fayrene Helper, MD  Vitamin D,  Ergocalciferol, (DRISDOL) 1.25 MG (50000 UT) CAPS capsule TAKE 1 CAPSULE BY MOUTH EVERY 2 WEEKS. Patient taking differently: Take 50,000 Units by mouth every 14 (fourteen) days.  04/28/18   Fayrene Helper, MD    Family History Family History  Problem Relation Age of Onset  . Kidney failure Mother        on dialysis  . Hypertension Mother   . Kidney disease Mother   . Prostate cancer Father   . Hypertension Father   . Glaucoma Father   . Colon cancer Sister 43  . Multiple myeloma Sister   . Hypertension Sister   . Glaucoma Sister   . Allergies Sister   . Hypertension Sister   . Glaucoma Sister   . Hyperlipidemia Sister   . Hypertension Brother   . Allergies Brother   . Hyperlipidemia Brother   . Colon cancer Other        family history   .  Lymphoma Other        family history     Social History Social History   Tobacco Use  . Smoking status: Never Smoker  . Smokeless tobacco: Never Used  Substance Use Topics  . Alcohol use: No  . Drug use: No     Allergies   Patient has no known allergies.   Review of Systems Review of Systems  Constitutional: Negative for appetite change and fatigue.  HENT: Negative for congestion, ear discharge and sinus pressure.        Congestion  Eyes: Negative for discharge.  Respiratory: Negative for cough.   Cardiovascular: Negative for chest pain.  Gastrointestinal: Negative for abdominal pain and diarrhea.  Genitourinary: Negative for frequency and hematuria.  Musculoskeletal: Negative for back pain.  Skin: Negative for rash.  Neurological: Negative for seizures and headaches.  Psychiatric/Behavioral: Negative for hallucinations.     Physical Exam Updated Vital Signs BP 132/76   Pulse (!) 111   Temp 98.8 F (37.1 C)   Resp 20   Wt 77.2 kg   SpO2 100%   BMI 31.13 kg/m   Physical Exam Vitals signs and nursing note reviewed.  Constitutional:      Appearance: She is well-developed.  HENT:     Head:  Normocephalic.     Right Ear: Ear canal normal.     Nose: Nose normal.  Eyes:     General: No scleral icterus.    Conjunctiva/sclera: Conjunctivae normal.  Neck:     Musculoskeletal: Neck supple.     Thyroid: No thyromegaly.  Cardiovascular:     Rate and Rhythm: Normal rate and regular rhythm.     Heart sounds: No murmur. No friction rub. No gallop.   Pulmonary:     Breath sounds: No stridor. No wheezing or rales.  Chest:     Chest wall: No tenderness.  Abdominal:     General: There is no distension.     Tenderness: There is no abdominal tenderness. There is no rebound.  Lymphadenopathy:     Cervical: No cervical adenopathy.  Skin:    Findings: No erythema or rash.  Neurological:     Mental Status: She is alert.     Motor: No abnormal muscle tone.     Coordination: Coordination normal.  Psychiatric:        Behavior: Behavior normal.      ED Treatments / Results  Labs (all labs ordered are listed, but only abnormal results are displayed) Labs Reviewed - No data to display  EKG None  Radiology Dg Chest Portable 1 View  Result Date: 12/03/2018 CLINICAL DATA:  Dysphagia EXAM: PORTABLE CHEST 1 VIEW COMPARISON:  10/04/2018 FINDINGS: Cardiac shadow is stable. The overall inspiratory effort is again poor similar to that seen on the prior exam. No focal infiltrate or sizable effusion is seen. S-shaped scoliosis of the thoracolumbar spine is noted. IMPRESSION: No acute abnormality noted. Electronically Signed   By: Inez Catalina M.D.   On: 12/03/2018 19:58    Procedures Procedures (including critical care time)  Medications Ordered in ED Medications - No data to display   Initial Impression / Assessment and Plan / ED Course  I have reviewed the triage vital signs and the nursing notes.  Pertinent labs & imaging results that were available during my care of the patient were reviewed by me and considered in my medical decision making (see chart for details).         Patient had a little  congestion in her nose and her mouth.  She was suctioned out and did fine.  Sister want to know if she can get a suction device at home.  I told her to contact her primary care doctor Monday  Final Clinical Impressions(s) / ED Diagnoses   Final diagnoses:  Congestion of upper airway    ED Discharge Orders         Ordered    cetirizine (ZYRTEC ALLERGY) 10 MG tablet  Daily     12/03/18 2205           Milton Ferguson, MD 12/04/18 1124

## 2018-12-05 ENCOUNTER — Telehealth: Payer: Self-pay | Admitting: Family Medicine

## 2018-12-05 DIAGNOSIS — L89892 Pressure ulcer of other site, stage 2: Secondary | ICD-10-CM

## 2018-12-05 DIAGNOSIS — G40909 Epilepsy, unspecified, not intractable, without status epilepticus: Secondary | ICD-10-CM

## 2018-12-05 DIAGNOSIS — L89322 Pressure ulcer of left buttock, stage 2: Secondary | ICD-10-CM

## 2018-12-05 DIAGNOSIS — L89143 Pressure ulcer of left lower back, stage 3: Secondary | ICD-10-CM | POA: Diagnosis not present

## 2018-12-05 DIAGNOSIS — F79 Unspecified intellectual disabilities: Secondary | ICD-10-CM

## 2018-12-05 DIAGNOSIS — E785 Hyperlipidemia, unspecified: Secondary | ICD-10-CM

## 2018-12-05 DIAGNOSIS — L02212 Cutaneous abscess of back [any part, except buttock]: Secondary | ICD-10-CM | POA: Diagnosis not present

## 2018-12-05 DIAGNOSIS — M4185 Other forms of scoliosis, thoracolumbar region: Secondary | ICD-10-CM

## 2018-12-05 DIAGNOSIS — B966 Bacteroides fragilis [B. fragilis] as the cause of diseases classified elsewhere: Secondary | ICD-10-CM | POA: Diagnosis not present

## 2018-12-05 DIAGNOSIS — I10 Essential (primary) hypertension: Secondary | ICD-10-CM

## 2018-12-05 DIAGNOSIS — L89312 Pressure ulcer of right buttock, stage 2: Secondary | ICD-10-CM

## 2018-12-05 DIAGNOSIS — L89891 Pressure ulcer of other site, stage 1: Secondary | ICD-10-CM

## 2018-12-05 DIAGNOSIS — Q039 Congenital hydrocephalus, unspecified: Secondary | ICD-10-CM

## 2018-12-05 DIAGNOSIS — G8250 Quadriplegia, unspecified: Secondary | ICD-10-CM

## 2018-12-05 DIAGNOSIS — Z4800 Encounter for change or removal of nonsurgical wound dressing: Secondary | ICD-10-CM | POA: Diagnosis not present

## 2018-12-05 DIAGNOSIS — E1149 Type 2 diabetes mellitus with other diabetic neurological complication: Secondary | ICD-10-CM

## 2018-12-05 MED ORDER — UNABLE TO FIND: 0 refills | Status: AC

## 2018-12-05 NOTE — Telephone Encounter (Signed)
Order sent for deep suctioning equipment to [facility]

## 2018-12-05 NOTE — Addendum Note (Signed)
Addended by: Eual Fines on: 12/05/2018 02:30 PM   Modules accepted: Orders

## 2018-12-05 NOTE — Telephone Encounter (Signed)
Please see pt message today, and order best deep suctioning equipment, / [place]?  Follow through and let me know if an issue. Pls directly send message to patient's family as far as your progress is concerned with this, need asap

## 2018-12-05 NOTE — Telephone Encounter (Signed)
mychart message sent to family

## 2018-12-07 ENCOUNTER — Telehealth: Payer: Self-pay

## 2018-12-07 ENCOUNTER — Encounter: Payer: Self-pay | Admitting: Family Medicine

## 2018-12-07 NOTE — Telephone Encounter (Signed)
[facility] [facility] is needing a diagnosis code and addended office notes that states the need for the equipment. Please let me know when done so I can fax.

## 2018-12-08 DIAGNOSIS — R1310 Dysphagia, unspecified: Secondary | ICD-10-CM | POA: Insufficient documentation

## 2018-12-08 DIAGNOSIS — R0989 Other specified symptoms and signs involving the circulatory and respiratory systems: Secondary | ICD-10-CM | POA: Insufficient documentation

## 2018-12-08 DIAGNOSIS — Q398 Other congenital malformations of esophagus: Secondary | ICD-10-CM | POA: Insufficient documentation

## 2018-12-08 NOTE — Assessment & Plan Note (Signed)
worseneing symptoms in terms of choking episodes needs deep suctioning device for in home use

## 2018-12-08 NOTE — Assessment & Plan Note (Signed)
Recurrent episodes of choking on both solids and liquids due to weak muscles as well as cognitive  impairment, from cerebral palsy. Device for deep suctioning needed, to reduce aspiration and protect airway, as well as for patient comfort

## 2018-12-08 NOTE — Assessment & Plan Note (Signed)
Increased frequency and severity of choking episodes noted by family members, needing ED evaluation, deep suctioning equipment needed at home

## 2018-12-08 NOTE — Telephone Encounter (Signed)
Faxed to [employer] and family aware

## 2018-12-08 NOTE — Telephone Encounter (Signed)
August 27 visit is addended, pls fax, thanks

## 2018-12-09 DIAGNOSIS — B966 Bacteroides fragilis [B. fragilis] as the cause of diseases classified elsewhere: Secondary | ICD-10-CM | POA: Diagnosis not present

## 2018-12-09 DIAGNOSIS — L89312 Pressure ulcer of right buttock, stage 2: Secondary | ICD-10-CM | POA: Diagnosis not present

## 2018-12-09 DIAGNOSIS — L02212 Cutaneous abscess of back [any part, except buttock]: Secondary | ICD-10-CM | POA: Diagnosis not present

## 2018-12-09 DIAGNOSIS — L89143 Pressure ulcer of left lower back, stage 3: Secondary | ICD-10-CM | POA: Diagnosis not present

## 2018-12-09 DIAGNOSIS — Z4800 Encounter for change or removal of nonsurgical wound dressing: Secondary | ICD-10-CM | POA: Diagnosis not present

## 2018-12-09 DIAGNOSIS — L89892 Pressure ulcer of other site, stage 2: Secondary | ICD-10-CM | POA: Diagnosis not present

## 2018-12-12 DIAGNOSIS — L89892 Pressure ulcer of other site, stage 2: Secondary | ICD-10-CM | POA: Diagnosis not present

## 2018-12-12 DIAGNOSIS — L89143 Pressure ulcer of left lower back, stage 3: Secondary | ICD-10-CM | POA: Diagnosis not present

## 2018-12-12 DIAGNOSIS — L89312 Pressure ulcer of right buttock, stage 2: Secondary | ICD-10-CM | POA: Diagnosis not present

## 2018-12-12 DIAGNOSIS — L02212 Cutaneous abscess of back [any part, except buttock]: Secondary | ICD-10-CM | POA: Diagnosis not present

## 2018-12-12 DIAGNOSIS — B966 Bacteroides fragilis [B. fragilis] as the cause of diseases classified elsewhere: Secondary | ICD-10-CM | POA: Diagnosis not present

## 2018-12-12 DIAGNOSIS — Z4800 Encounter for change or removal of nonsurgical wound dressing: Secondary | ICD-10-CM | POA: Diagnosis not present

## 2018-12-16 DIAGNOSIS — B966 Bacteroides fragilis [B. fragilis] as the cause of diseases classified elsewhere: Secondary | ICD-10-CM | POA: Diagnosis not present

## 2018-12-16 DIAGNOSIS — L89143 Pressure ulcer of left lower back, stage 3: Secondary | ICD-10-CM | POA: Diagnosis not present

## 2018-12-16 DIAGNOSIS — Z4800 Encounter for change or removal of nonsurgical wound dressing: Secondary | ICD-10-CM | POA: Diagnosis not present

## 2018-12-16 DIAGNOSIS — L02212 Cutaneous abscess of back [any part, except buttock]: Secondary | ICD-10-CM | POA: Diagnosis not present

## 2018-12-16 DIAGNOSIS — L89892 Pressure ulcer of other site, stage 2: Secondary | ICD-10-CM | POA: Diagnosis not present

## 2018-12-16 DIAGNOSIS — L89312 Pressure ulcer of right buttock, stage 2: Secondary | ICD-10-CM | POA: Diagnosis not present

## 2018-12-19 ENCOUNTER — Other Ambulatory Visit: Payer: Self-pay

## 2018-12-19 ENCOUNTER — Telehealth: Payer: Self-pay

## 2018-12-19 ENCOUNTER — Telehealth: Payer: Self-pay | Admitting: *Deleted

## 2018-12-19 DIAGNOSIS — L89143 Pressure ulcer of left lower back, stage 3: Secondary | ICD-10-CM | POA: Diagnosis not present

## 2018-12-19 DIAGNOSIS — B966 Bacteroides fragilis [B. fragilis] as the cause of diseases classified elsewhere: Secondary | ICD-10-CM | POA: Diagnosis not present

## 2018-12-19 DIAGNOSIS — L89892 Pressure ulcer of other site, stage 2: Secondary | ICD-10-CM | POA: Diagnosis not present

## 2018-12-19 DIAGNOSIS — Z4800 Encounter for change or removal of nonsurgical wound dressing: Secondary | ICD-10-CM | POA: Diagnosis not present

## 2018-12-19 DIAGNOSIS — L02212 Cutaneous abscess of back [any part, except buttock]: Secondary | ICD-10-CM | POA: Diagnosis not present

## 2018-12-19 DIAGNOSIS — L89312 Pressure ulcer of right buttock, stage 2: Secondary | ICD-10-CM | POA: Diagnosis not present

## 2018-12-19 MED ORDER — BENZONATATE 100 MG PO CAPS: ORAL_CAPSULE | ORAL | 0 refills | Status: DC

## 2018-12-19 NOTE — Telephone Encounter (Signed)
April Watson called for pt said she was out of tessalon pearls and needs these called in to [place].

## 2018-12-19 NOTE — Telephone Encounter (Signed)
Refill sent.

## 2018-12-19 NOTE — Telephone Encounter (Signed)
Inez Catalina from [facility] called regarding patient status, left leg and buttocks wound looks worse than when she saw her Friday and states that wounds have an odor. Placed wet to dry dressing and educated sister on how to change the dressing. MD notified.

## 2018-12-22 ENCOUNTER — Other Ambulatory Visit: Payer: Self-pay

## 2018-12-22 ENCOUNTER — Ambulatory Visit (INDEPENDENT_AMBULATORY_CARE_PROVIDER_SITE_OTHER): Payer: Medicare Other | Admitting: General Surgery

## 2018-12-22 ENCOUNTER — Encounter: Payer: Self-pay | Admitting: General Surgery

## 2018-12-22 VITALS — BP 118/82 | HR 121 | Temp 98.6°F

## 2018-12-22 DIAGNOSIS — E785 Hyperlipidemia, unspecified: Secondary | ICD-10-CM | POA: Diagnosis not present

## 2018-12-22 DIAGNOSIS — I10 Essential (primary) hypertension: Secondary | ICD-10-CM | POA: Diagnosis not present

## 2018-12-22 DIAGNOSIS — L89322 Pressure ulcer of left buttock, stage 2: Secondary | ICD-10-CM

## 2018-12-22 DIAGNOSIS — L89151 Pressure ulcer of sacral region, stage 1: Secondary | ICD-10-CM

## 2018-12-22 DIAGNOSIS — E559 Vitamin D deficiency, unspecified: Secondary | ICD-10-CM | POA: Diagnosis not present

## 2018-12-22 MED ORDER — SANTYL 250 UNIT/GM EX OINT: 1.0000 "application " | TOPICAL_OINTMENT | Freq: Three times a day (TID) | CUTANEOUS | 4 refills | Status: DC

## 2018-12-22 NOTE — Progress Notes (Signed)
[facility] Note   HPI:  58 y.o. Female presents to clinic for follow-up evaluation of her left flank and ischial wounds. Her family reports worsening wounds and the Home Health RN also felt that the ischial wounds were getting larger. Some foul drainage per report.   Review of Systems:  Eating well Some choking per family with food Wound dressed daily  All other review of systems: otherwise negative   Vital Signs:  BP 118/82   Pulse (!) 121   Temp 98.6 F (37 C) (Temporal)    Physical Exam:  Physical Exam Vitals signs reviewed.  Cardiovascular:     Rate and Rhythm: Normal rate.  Pulmonary:     Effort: Pulmonary effort is normal.  Musculoskeletal:     Comments: Left flank wound healing, granulation, more superficial, no drainage, calcium alginate replaced; left ischial / buttock wounds X 3, stage II ulcers, sloughing/ fibrinous material excised at bedside, some undermining and tunneling, down to bleeding tissue, packed with saline dampened gauze     Assessment:  58 y.o. yo Female with multiple pressure wounds in various stages. She is bedbound and her family does the best they can to move her. She has a air mattress. Overall the patient is going to continue to get these wounds. Explained the pathophysiology of the pressure ulcers and how they progress.  Offered hospice as an option. Family not ready for this option at this time.   Plan:  Santyl to left ischial/ buttock wounds three times daily (wound base), place saline dampened gauze into the wounds and cover with dry dressing. Continue calcium alginate to the left flank wound daily.   All of the above recommendations were discussed with the patient and patient's family, and all of patient's and family's questions were answered to their expressed satisfaction.  Curlene Labrum, MD [facility] [address] [place], [place] [phone] (office)

## 2018-12-22 NOTE — Patient Instructions (Signed)
Santyl to left ischial/ buttock wounds three times daily (wound base), place saline dampened gauze into the wounds and cover with dry dressing. Continue calcium alginate to the left flank wound daily.

## 2018-12-23 ENCOUNTER — Encounter: Payer: Self-pay | Admitting: Family Medicine

## 2018-12-23 ENCOUNTER — Other Ambulatory Visit: Payer: Self-pay | Admitting: Family Medicine

## 2018-12-23 DIAGNOSIS — L89143 Pressure ulcer of left lower back, stage 3: Secondary | ICD-10-CM | POA: Diagnosis not present

## 2018-12-23 DIAGNOSIS — L89892 Pressure ulcer of other site, stage 2: Secondary | ICD-10-CM | POA: Diagnosis not present

## 2018-12-23 DIAGNOSIS — L02212 Cutaneous abscess of back [any part, except buttock]: Secondary | ICD-10-CM | POA: Diagnosis not present

## 2018-12-23 DIAGNOSIS — L89312 Pressure ulcer of right buttock, stage 2: Secondary | ICD-10-CM | POA: Diagnosis not present

## 2018-12-23 DIAGNOSIS — Z4800 Encounter for change or removal of nonsurgical wound dressing: Secondary | ICD-10-CM | POA: Diagnosis not present

## 2018-12-23 DIAGNOSIS — B966 Bacteroides fragilis [B. fragilis] as the cause of diseases classified elsewhere: Secondary | ICD-10-CM | POA: Diagnosis not present

## 2018-12-23 MED ORDER — CEPHALEXIN 250 MG/5ML PO SUSR: ORAL | 0 refills | Status: AC

## 2018-12-24 LAB — CBC WITH DIFFERENTIAL/PLATELET
Basophils Absolute: 0 10*3/uL (ref 0.0–0.2)
Basos: 0 %
EOS (ABSOLUTE): 0 10*3/uL (ref 0.0–0.4)
Eos: 0 %
Hematocrit: 33 % — ABNORMAL LOW (ref 34.0–46.6)
Hemoglobin: 11 g/dL — ABNORMAL LOW (ref 11.1–15.9)
Immature Grans (Abs): 0.1 10*3/uL (ref 0.0–0.1)
Immature Granulocytes: 1 %
Lymphocytes Absolute: 1.8 10*3/uL (ref 0.7–3.1)
Lymphs: 10 %
MCH: 29 pg (ref 26.6–33.0)
MCHC: 33.3 g/dL (ref 31.5–35.7)
MCV: 87 fL (ref 79–97)
Monocytes Absolute: 1.2 10*3/uL — ABNORMAL HIGH (ref 0.1–0.9)
Monocytes: 6 %
Neutrophils Absolute: 15.3 10*3/uL — ABNORMAL HIGH (ref 1.4–7.0)
Neutrophils: 83 %
Platelets: 412 10*3/uL (ref 150–450)
RBC: 3.79 x10E6/uL (ref 3.77–5.28)
RDW: 11.5 % — ABNORMAL LOW (ref 11.7–15.4)
WBC: 18.4 10*3/uL — ABNORMAL HIGH (ref 3.4–10.8)

## 2018-12-24 LAB — CMP14+EGFR
ALT: 15 IU/L (ref 0–32)
AST: 17 IU/L (ref 0–40)
Albumin/Globulin Ratio: 0.5 — ABNORMAL LOW (ref 1.2–2.2)
Albumin: 1.7 g/dL — CL (ref 3.8–4.9)
Alkaline Phosphatase: 102 IU/L (ref 39–117)
BUN/Creatinine Ratio: 22 (ref 9–23)
BUN: 6 mg/dL (ref 6–24)
Bilirubin Total: <0.2 mg/dL (ref 0.0–1.2)
CO2: 22 mmol/L (ref 20–29)
Calcium: 8.2 mg/dL — ABNORMAL LOW (ref 8.7–10.2)
Chloride: 93 mmol/L — ABNORMAL LOW (ref 96–106)
Creatinine, Ser: 0.27 mg/dL — ABNORMAL LOW (ref 0.57–1.00)
GFR calc Af Amer: 151 mL/min/{1.73_m2} (ref >59)
GFR calc non Af Amer: 131 mL/min/{1.73_m2} (ref >59)
Globulin, Total: 3.4 g/dL (ref 1.5–4.5)
Glucose: 115 mg/dL — ABNORMAL HIGH (ref 65–99)
Potassium: 4.5 mmol/L (ref 3.5–5.2)
Sodium: 131 mmol/L — ABNORMAL LOW (ref 134–144)
Total Protein: 5.1 g/dL — ABNORMAL LOW (ref 6.0–8.5)

## 2018-12-24 LAB — LIPID PANEL WITH LDL/HDL RATIO
Cholesterol, Total: 129 mg/dL (ref 100–199)
HDL: 44 mg/dL (ref >39)
LDL Chol Calc (NIH): 64 mg/dL (ref 0–99)
LDL/HDL Ratio: 1.5 ratio (ref 0.0–3.2)
Triglycerides: 114 mg/dL (ref 0–149)
VLDL Cholesterol Cal: 21 mg/dL (ref 5–40)

## 2018-12-24 LAB — VITAMIN D 25 HYDROXY (VIT D DEFICIENCY, FRACTURES): Vit D, 25-Hydroxy: 85.4 ng/mL (ref 30.0–100.0)

## 2018-12-26 DIAGNOSIS — K5909 Other constipation: Secondary | ICD-10-CM | POA: Diagnosis not present

## 2018-12-26 DIAGNOSIS — L89892 Pressure ulcer of other site, stage 2: Secondary | ICD-10-CM | POA: Diagnosis not present

## 2018-12-26 DIAGNOSIS — Z4800 Encounter for change or removal of nonsurgical wound dressing: Secondary | ICD-10-CM | POA: Diagnosis not present

## 2018-12-26 DIAGNOSIS — L89322 Pressure ulcer of left buttock, stage 2: Secondary | ICD-10-CM | POA: Diagnosis not present

## 2018-12-26 DIAGNOSIS — K219 Gastro-esophageal reflux disease without esophagitis: Secondary | ICD-10-CM | POA: Diagnosis not present

## 2018-12-26 DIAGNOSIS — G8250 Quadriplegia, unspecified: Secondary | ICD-10-CM | POA: Diagnosis not present

## 2018-12-26 DIAGNOSIS — M6284 Sarcopenia: Secondary | ICD-10-CM | POA: Diagnosis not present

## 2018-12-26 DIAGNOSIS — K5989 Other specified functional intestinal disorders: Secondary | ICD-10-CM | POA: Diagnosis not present

## 2018-12-26 DIAGNOSIS — E1149 Type 2 diabetes mellitus with other diabetic neurological complication: Secondary | ICD-10-CM | POA: Diagnosis not present

## 2018-12-26 DIAGNOSIS — I051 Rheumatic mitral insufficiency: Secondary | ICD-10-CM | POA: Diagnosis not present

## 2018-12-26 DIAGNOSIS — R32 Unspecified urinary incontinence: Secondary | ICD-10-CM | POA: Diagnosis not present

## 2018-12-26 DIAGNOSIS — L89143 Pressure ulcer of left lower back, stage 3: Secondary | ICD-10-CM | POA: Diagnosis not present

## 2018-12-26 DIAGNOSIS — L89312 Pressure ulcer of right buttock, stage 2: Secondary | ICD-10-CM | POA: Diagnosis not present

## 2018-12-26 DIAGNOSIS — I10 Essential (primary) hypertension: Secondary | ICD-10-CM | POA: Diagnosis not present

## 2018-12-26 DIAGNOSIS — Z9181 History of falling: Secondary | ICD-10-CM | POA: Diagnosis not present

## 2018-12-26 DIAGNOSIS — B966 Bacteroides fragilis [B. fragilis] as the cause of diseases classified elsewhere: Secondary | ICD-10-CM | POA: Diagnosis not present

## 2018-12-26 DIAGNOSIS — M47819 Spondylosis without myelopathy or radiculopathy, site unspecified: Secondary | ICD-10-CM | POA: Diagnosis not present

## 2018-12-26 DIAGNOSIS — L89891 Pressure ulcer of other site, stage 1: Secondary | ICD-10-CM | POA: Diagnosis not present

## 2018-12-26 DIAGNOSIS — L02212 Cutaneous abscess of back [any part, except buttock]: Secondary | ICD-10-CM | POA: Diagnosis not present

## 2018-12-26 DIAGNOSIS — G40909 Epilepsy, unspecified, not intractable, without status epilepticus: Secondary | ICD-10-CM | POA: Diagnosis not present

## 2018-12-26 DIAGNOSIS — K8020 Calculus of gallbladder without cholecystitis without obstruction: Secondary | ICD-10-CM | POA: Diagnosis not present

## 2018-12-26 DIAGNOSIS — Q039 Congenital hydrocephalus, unspecified: Secondary | ICD-10-CM | POA: Diagnosis not present

## 2018-12-26 DIAGNOSIS — E785 Hyperlipidemia, unspecified: Secondary | ICD-10-CM | POA: Diagnosis not present

## 2018-12-26 DIAGNOSIS — M4185 Other forms of scoliosis, thoracolumbar region: Secondary | ICD-10-CM | POA: Diagnosis not present

## 2018-12-26 DIAGNOSIS — F79 Unspecified intellectual disabilities: Secondary | ICD-10-CM | POA: Diagnosis not present

## 2018-12-28 ENCOUNTER — Encounter: Payer: Self-pay | Admitting: Family Medicine

## 2018-12-28 ENCOUNTER — Ambulatory Visit (INDEPENDENT_AMBULATORY_CARE_PROVIDER_SITE_OTHER): Payer: Medicare Other | Admitting: Family Medicine

## 2018-12-28 ENCOUNTER — Other Ambulatory Visit: Payer: Self-pay

## 2018-12-28 DIAGNOSIS — R630 Anorexia: Secondary | ICD-10-CM

## 2018-12-28 DIAGNOSIS — L89103 Pressure ulcer of unspecified part of back, stage 3: Secondary | ICD-10-CM | POA: Diagnosis not present

## 2018-12-28 DIAGNOSIS — E43 Unspecified severe protein-calorie malnutrition: Secondary | ICD-10-CM | POA: Diagnosis not present

## 2018-12-28 MED ORDER — POTASSIUM CHLORIDE ER 10 MEQ PO TBCR: EXTENDED_RELEASE_TABLET | ORAL | 5 refills | Status: AC

## 2018-12-28 MED ORDER — PANTOPRAZOLE SODIUM 40 MG PO TBEC: 40.0000 mg | DELAYED_RELEASE_TABLET | Freq: Every day | ORAL | 5 refills | Status: AC

## 2018-12-28 MED ORDER — LINACLOTIDE 145 MCG PO CAPS: 145.0000 ug | ORAL_CAPSULE | Freq: Every day | ORAL | 5 refills | Status: AC

## 2018-12-30 DIAGNOSIS — L89312 Pressure ulcer of right buttock, stage 2: Secondary | ICD-10-CM | POA: Diagnosis not present

## 2018-12-30 DIAGNOSIS — L89892 Pressure ulcer of other site, stage 2: Secondary | ICD-10-CM | POA: Diagnosis not present

## 2018-12-30 DIAGNOSIS — B966 Bacteroides fragilis [B. fragilis] as the cause of diseases classified elsewhere: Secondary | ICD-10-CM | POA: Diagnosis not present

## 2018-12-30 DIAGNOSIS — Z4800 Encounter for change or removal of nonsurgical wound dressing: Secondary | ICD-10-CM | POA: Diagnosis not present

## 2018-12-30 DIAGNOSIS — L02212 Cutaneous abscess of back [any part, except buttock]: Secondary | ICD-10-CM | POA: Diagnosis not present

## 2018-12-30 DIAGNOSIS — L89143 Pressure ulcer of left lower back, stage 3: Secondary | ICD-10-CM | POA: Diagnosis not present

## 2019-01-01 DIAGNOSIS — E46 Unspecified protein-calorie malnutrition: Secondary | ICD-10-CM | POA: Insufficient documentation

## 2019-01-01 NOTE — Assessment & Plan Note (Signed)
Progressively worsening skin breakdown per family report. Getting assistance from home health as well as being followed by general surgeruy Markedly elevated WBC wih a shift , suggestive of sepsis. Currently on keflex

## 2019-01-01 NOTE — Patient Instructions (Signed)
F/u in office in 4 months, call if you need me sooner.  No change in management  We are here for you and your family throughout your illness, to help in any way that we are able to.  Thanks for choosing [facility], we consider it a privelige to serve you.

## 2019-01-01 NOTE — Assessment & Plan Note (Signed)
Apparent weight loss clinically, unable to obtain accurate weight, and marked malnutrition with low albumin. Pt severely ill, with guarded prognosis, which I stated to the family that from a medical standpoint , I would say that a miracle is needed for April Watson to recover from her current condition, however , I also stated that miracles do happen , o that they should continue to do what they feel led to do with love as long as they are able to do so. Her 70 th birthday is in next several days, at birth, family was told that she would not see her second birthday

## 2019-01-01 NOTE — Progress Notes (Signed)
April Watson     MRN: [id number]      DOB: Sep 14, 1960   HPI April Watson is here for follow up and re-evaluation of chronic medical conditions, medication management and review of any available recent lab data She is accompanied initially by her sister , and later a family conference with her brother regarding her overall health . C/o left hand swelling which is not new C/o increasing skin breakdown with extension of stage 4 ulcers on lower back and buttocks, despite best efforts of 3 family members, home health nurse and surgeon Denies recurrent choking , though now have [place] for use if needed. Sister voices trepidation and  A sense of fear whever EMS is called as she recalls recent similar episodes with her deceased sister. She is fearful, asks specifically what signs should she look for in the event of April Watson's passing I explained, reduced appetite, reduced interaction , increased drowsiness, slower and eventually labored breathing and unresponsiveness Denies skin breakdown of feet C/o trauma to skin on ear lobes, with darkening of skin , but no visible breakdown Discussion with both siblings reveals that family still wants to do everything for April Watson without hospice involvement at this time, and they also state and have acted out the desire NOT to have April Watson suffer. I assure them both that their wishes are respected and will be followed , applaud heir love and bravery in a difficult situation and assure them of continued support and availability for whatever the request is, and they are appreciative Currently on antibiotic course  Recent labs show elevated WBC with a shift  and low albumin ROS Per family member as pt is aphasoic Denies recent fever or chills. Denies excessive nasal drainage. Denies chest congestion, productive cough or wheezing. Denies  leg swelling Denies vomiting,diarrhea or constipation.  C/o abdominal distension, mild as comp[ared o in the past  Chronic limitation  in mobility.quadriplegic   PE  BP 130/86   Pulse (!) 133   Temp 97.7 F (36.5 C) (Temporal)   Patient chronically ill appearing, bitemporal wasting , less interactive than previously , however at times very vocal, no positive smiling interaction as in the past, brother states she did not want to come to appointment and he believes that she is tired.  HEENT: No facial asymmetry,     Neck decreased ROM .  Chest: Adequate air entry, though reduced, no crackles or wheezes head  CVS: S1, S2 no murmurs, no S3.Regular rate.  ABD: mildly distended Soft non tender. Normal BS  Ext:  Edema of left hand, no pedal edema , no edema of rigth hand  [id number]  ROM spine, shoulders, hips and knees.  Skin: stage 4 ulcers of lower back and buttock, picures sent by family    CNS: CN 2-12 intact, power,  normal throughout.no focal deficits noted.   Assessment & Plan  Decubitus ulcer of back, stage 3 ([place]) Progressively worsening skin breakdown per family report. Getting assistance from home health as well as being followed by general surgeruy Markedly elevated WBC wih a shift , suggestive of sepsis. Currently on keflex  Poor appetite Apparent weight loss clinically, unable to obtain accurate weight, and marked malnutrition with low albumin. Pt severely ill, with guarded prognosis, which I stated to the family that from a medical standpoint , I would say that a miracle is needed for April Watson to recover from her current condition, however , I also stated that miracles do happen , o that they should  continue to do what they feel led to do with love as long as they are able to do so. Her 27 th birthday is in next several days, at birth, family was told that she would not see her second birthday  Protein-calorie malnutrition ([place]) Severe hypo albuminemia, reduced appetite, sepsis, supportive care of pt with chronic illness , with gradual  deterioration in her overall health. Determined several  months agoby family,  that PEG feeds were not desired

## 2019-01-01 NOTE — Assessment & Plan Note (Signed)
Severe hypo albuminemia, reduced appetite, sepsis, supportive care of pt with chronic illness , with gradual  deterioration in her overall health. Determined several months agoby family,  that PEG feeds were not desired

## 2019-01-02 ENCOUNTER — Other Ambulatory Visit: Payer: Self-pay | Admitting: Family Medicine

## 2019-01-02 ENCOUNTER — Telehealth: Payer: Self-pay | Admitting: *Deleted

## 2019-01-02 DIAGNOSIS — B966 Bacteroides fragilis [B. fragilis] as the cause of diseases classified elsewhere: Secondary | ICD-10-CM | POA: Diagnosis not present

## 2019-01-02 DIAGNOSIS — Z4800 Encounter for change or removal of nonsurgical wound dressing: Secondary | ICD-10-CM | POA: Diagnosis not present

## 2019-01-02 DIAGNOSIS — L89892 Pressure ulcer of other site, stage 2: Secondary | ICD-10-CM | POA: Diagnosis not present

## 2019-01-02 DIAGNOSIS — L89143 Pressure ulcer of left lower back, stage 3: Secondary | ICD-10-CM | POA: Diagnosis not present

## 2019-01-02 DIAGNOSIS — L89312 Pressure ulcer of right buttock, stage 2: Secondary | ICD-10-CM | POA: Diagnosis not present

## 2019-01-02 DIAGNOSIS — L02212 Cutaneous abscess of back [any part, except buttock]: Secondary | ICD-10-CM | POA: Diagnosis not present

## 2019-01-02 MED ORDER — TRAMADOL HCL 50 MG PO TABS: ORAL_TABLET | ORAL | 0 refills | Status: AC

## 2019-01-02 NOTE — Telephone Encounter (Signed)
Prescription for tramadol 50 mg one daily as needed, prior to dressing change is sent ,ls let her know

## 2019-01-02 NOTE — Progress Notes (Signed)
tra

## 2019-01-02 NOTE — Telephone Encounter (Signed)
April Watson with [facility] health was calling regarding pt. She is having pain from extensive wounds and she wanted to see if Dr Moshe Cipro could prescribe her something for pain before the dressing changes so it would hurt so bad for the pt. She can be reach at [phone]

## 2019-01-02 NOTE — Telephone Encounter (Signed)
Please advise 

## 2019-01-03 DIAGNOSIS — L89312 Pressure ulcer of right buttock, stage 2: Secondary | ICD-10-CM | POA: Diagnosis not present

## 2019-01-03 DIAGNOSIS — L89143 Pressure ulcer of left lower back, stage 3: Secondary | ICD-10-CM | POA: Diagnosis not present

## 2019-01-03 DIAGNOSIS — L89892 Pressure ulcer of other site, stage 2: Secondary | ICD-10-CM | POA: Diagnosis not present

## 2019-01-03 DIAGNOSIS — B966 Bacteroides fragilis [B. fragilis] as the cause of diseases classified elsewhere: Secondary | ICD-10-CM | POA: Diagnosis not present

## 2019-01-03 DIAGNOSIS — L02212 Cutaneous abscess of back [any part, except buttock]: Secondary | ICD-10-CM | POA: Diagnosis not present

## 2019-01-03 DIAGNOSIS — Z4800 Encounter for change or removal of nonsurgical wound dressing: Secondary | ICD-10-CM | POA: Diagnosis not present

## 2019-01-03 NOTE — Telephone Encounter (Signed)
April Watson aware and family also wanted you to know that they have started seeing pieces of capsules/meds in her stool and this is new. Didn't know if this was normal or if she wasn't digesting them properly. Also April Watson said she thinks the family may be considering palliative/hospice more now than they were last week so she will discuss that with them in the next few days.

## 2019-01-06 DIAGNOSIS — B966 Bacteroides fragilis [B. fragilis] as the cause of diseases classified elsewhere: Secondary | ICD-10-CM | POA: Diagnosis not present

## 2019-01-06 DIAGNOSIS — L02212 Cutaneous abscess of back [any part, except buttock]: Secondary | ICD-10-CM | POA: Diagnosis not present

## 2019-01-06 DIAGNOSIS — Z4800 Encounter for change or removal of nonsurgical wound dressing: Secondary | ICD-10-CM | POA: Diagnosis not present

## 2019-01-06 DIAGNOSIS — L89892 Pressure ulcer of other site, stage 2: Secondary | ICD-10-CM | POA: Diagnosis not present

## 2019-01-06 DIAGNOSIS — L89312 Pressure ulcer of right buttock, stage 2: Secondary | ICD-10-CM | POA: Diagnosis not present

## 2019-01-06 DIAGNOSIS — L89143 Pressure ulcer of left lower back, stage 3: Secondary | ICD-10-CM | POA: Diagnosis not present

## 2019-01-09 DIAGNOSIS — L89143 Pressure ulcer of left lower back, stage 3: Secondary | ICD-10-CM | POA: Diagnosis not present

## 2019-01-09 DIAGNOSIS — Z4800 Encounter for change or removal of nonsurgical wound dressing: Secondary | ICD-10-CM | POA: Diagnosis not present

## 2019-01-09 DIAGNOSIS — L89892 Pressure ulcer of other site, stage 2: Secondary | ICD-10-CM | POA: Diagnosis not present

## 2019-01-09 DIAGNOSIS — B966 Bacteroides fragilis [B. fragilis] as the cause of diseases classified elsewhere: Secondary | ICD-10-CM | POA: Diagnosis not present

## 2019-01-09 DIAGNOSIS — L02212 Cutaneous abscess of back [any part, except buttock]: Secondary | ICD-10-CM | POA: Diagnosis not present

## 2019-01-09 DIAGNOSIS — L89312 Pressure ulcer of right buttock, stage 2: Secondary | ICD-10-CM | POA: Diagnosis not present

## 2019-01-10 ENCOUNTER — Telehealth: Payer: Self-pay | Admitting: *Deleted

## 2019-01-10 DIAGNOSIS — L89312 Pressure ulcer of right buttock, stage 2: Secondary | ICD-10-CM | POA: Diagnosis not present

## 2019-01-10 DIAGNOSIS — L89892 Pressure ulcer of other site, stage 2: Secondary | ICD-10-CM | POA: Diagnosis not present

## 2019-01-10 DIAGNOSIS — L02212 Cutaneous abscess of back [any part, except buttock]: Secondary | ICD-10-CM | POA: Diagnosis not present

## 2019-01-10 DIAGNOSIS — B966 Bacteroides fragilis [B. fragilis] as the cause of diseases classified elsewhere: Secondary | ICD-10-CM | POA: Diagnosis not present

## 2019-01-10 DIAGNOSIS — L89143 Pressure ulcer of left lower back, stage 3: Secondary | ICD-10-CM | POA: Diagnosis not present

## 2019-01-10 DIAGNOSIS — Z4800 Encounter for change or removal of nonsurgical wound dressing: Secondary | ICD-10-CM | POA: Diagnosis not present

## 2019-01-10 NOTE — Telephone Encounter (Signed)
noted 

## 2019-01-10 NOTE — Telephone Encounter (Signed)
Verbal orders have been given. Family wants referral for [facility] quality [facility] (used to be [facility]). They do not want [facility]

## 2019-01-10 NOTE — Telephone Encounter (Signed)
April Watson social worker for [facility] she needs verbal orders for additional social work visit family is requesting to talk about pallative care and she also needs pallative care referral. She can be reached at [phone]

## 2019-01-11 ENCOUNTER — Ambulatory Visit: Payer: Medicare Other | Admitting: General Surgery

## 2019-01-11 ENCOUNTER — Telehealth: Payer: Self-pay

## 2019-01-11 NOTE — Telephone Encounter (Signed)
Telephone call to patient to schedule palliative care visit with patient. Patient/family in agreement with home visit on 01-12-19 at 9:00 AM.

## 2019-01-12 ENCOUNTER — Other Ambulatory Visit: Payer: Medicare Other

## 2019-01-12 ENCOUNTER — Other Ambulatory Visit: Payer: Self-pay

## 2019-01-12 DIAGNOSIS — L89312 Pressure ulcer of right buttock, stage 2: Secondary | ICD-10-CM | POA: Diagnosis not present

## 2019-01-12 DIAGNOSIS — L89143 Pressure ulcer of left lower back, stage 3: Secondary | ICD-10-CM | POA: Diagnosis not present

## 2019-01-12 DIAGNOSIS — Z4800 Encounter for change or removal of nonsurgical wound dressing: Secondary | ICD-10-CM | POA: Diagnosis not present

## 2019-01-12 DIAGNOSIS — B966 Bacteroides fragilis [B. fragilis] as the cause of diseases classified elsewhere: Secondary | ICD-10-CM | POA: Diagnosis not present

## 2019-01-12 DIAGNOSIS — L02212 Cutaneous abscess of back [any part, except buttock]: Secondary | ICD-10-CM | POA: Diagnosis not present

## 2019-01-12 DIAGNOSIS — Z515 Encounter for palliative care: Secondary | ICD-10-CM

## 2019-01-12 DIAGNOSIS — L89892 Pressure ulcer of other site, stage 2: Secondary | ICD-10-CM | POA: Diagnosis not present

## 2019-01-12 NOTE — Progress Notes (Signed)
PATIENT NAME: April Watson DOB: November 12, 1960 MRN: [id number]  PRIMARY CARE PROVIDER: Fayrene Helper, MD  RESPONSIBLE PARTY:  Acct ID - Guarantor Home Phone Work Phone Relationship Acct Type  [id number] Marlane [phone]  Self P/F     [place], [place],  [address]    PLAN OF CARE and INTERVENTIONS:               1.  GOALS OF CARE/ ADVANCE CARE PLANNING:  Remain at home with family and be comfortable.             2.  PATIENT/CAREGIVER EDUCATION:  Education on s/s of infection, education on decline, education on palliative verses hospice care, reviewed meds, support              3.  DISEASE STATUS:  SW and RN made home care visit. Palliative care team met with patients sister [name] who is patient's legal guardian, patients brother Inocente Salles and Sam's wife [name]. Patient is a 11 year old patient of Dr. Signa Kell. 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 Patient unable to speak but smiles and does hand signs to let family know what she wants or how she is feeling. Family reports patient is eating less and may drink 1 Ensure per day. Patient is sleeping more. Patient's albumin 1.7. Patient is dependent for all ADL's.  Patient has Tramadol in the home to take as needed for pain. Home health nurse arrives and performs wound care to patients wounds. Patient has a CAP worker that is in the home 8 hours 5 days per week. Patient is no longer getting up and has been in the bed for the past 2 weeks. Dr. Moshe Cipro has talked with family about hospice. Palliative care team provides education on palliative care vs. Hospice care. Palliative care team talks with family and informed them  patient is hospice appropriate. Family states as long as they can keep patient's CAP aide they would like to have hospice care for patient. Nurse calls and leaves message at Doctor Simpson's office to ask for order for hospice evaluation.  Family states they feel like MD will be glad to sign orders as MD has made recommendation for hospice. Patient medications reviewed with family. Family requests a DNR for patient. Nurse call Doctor Clifton James and updated Dr Clifton James on  patients status and Dr. Clifton James to let referral intake know that patient is appropriate and to call family to schedule visit. Support provided to family in managing patients care. Family encouraged to contact palliative care team with questions or concerns.    HISTORY OF PRESENT ILLNESS:  Patient is a 58 year old female who resides at home with CAP worker in the home 5 days per week/ 8 hours per day.  Family requests comfort care for patient.  Family in agreement with hospice services for patient.    CODE STATUS: DNR  ADVANCED DIRECTIVES: Patient's sister [name] is patient's POA.   MOST FORM: No PPS: 30%   PHYSICAL EXAM:   VITALS: Today's Vitals   01/12/19 1000  BP: 110/60  Pulse: (!) 105  Resp: 18  Temp: 97.7 F (36.5 C)  TempSrc: Temporal  PainSc: 4   PainLoc: Generalized    LUNGS: Decreased breath sounds CARDIAC: Cor Tachy  EXTREMITIES:  none edema SKIN: multiple areas of skin breakdown/ decubs  NEURO: positive for gait problems, memory problems, seizures, speech problems and weakness       Nilda Simmer, RN

## 2019-01-12 NOTE — Progress Notes (Signed)
COMMUNITY PALLIATIVE CARE SW NOTE  PATIENT NAME: April Watson DOB: May 08, 1960 MRN: [id number]  PRIMARY CARE PROVIDER: Fayrene Helper, MD  RESPONSIBLE PARTY:  Acct ID - Guarantor Home Phone Work Phone Relationship Acct Type  [id number] Marlane [phone]  Self P/F     [place] [address], [place], [place] [address]     PLAN OF CARE and INTERVENTIONS:             1. GOALS OF CARE/ ADVANCE CARE PLANNING:  Patient is DNR, needs form. Patient's legal guardian is [name] (patient's sister). Patient's goal is to remain at home with family. 2. SOCIAL/EMOTIONAL/SPIRITUAL ASSESSMENT/ INTERVENTIONS:  SW and RN met with patient, Sharen Heck (patient's brother) and Sam's wife. Leah (patient's CAP aide) was present with patient. [name] and Sam provided brief history. Patient has been dependent on her family for care her entire life. Patient had another older sister that also helped, but she died in Mar 25, 2016. Sam explained that patient was more independent years ago and able to participate in community activities but since her decline, patient has become bedbound this year and less engaged. Patient also developed bedsores on her left lower back, and buttocks. Patient had two hospitalizations in July for infection and her wounds. RN assessed wounds with Inez Catalina (patient's wound care nurse). Patient's appetite is poor and consists of mostly liquids. If patient eats any solid food, it is only bites. Per family, patient is sleeping more. Patient enjoys watching television and spending time with family. SW discussed hospice and palliative care, provided emotional support and discussed patient/family care goals.  3. PATIENT/CAREGIVER EDUCATION/ COPING:  Patient is coping well per family. Patient was alert. Patient is difficult to understand but does try to communicate in other ways. [name] was tearful, expressing her difficulty with coping with patient's decline and her bedsores. Sam voiced that he was hoping that  patient was not eligible for hospice at this time but verbalized understanding of the referral to be made when it is most appropriate so that patient and family can have end-of-life support. Team validated feelings and provided emotional support to family. Family was appreciative of team support. 4. PERSONAL EMERGENCY PLAN:  Family will call 9-1-1 for emergencies.  5. COMMUNITY RESOURCES COORDINATION/ HEALTH CARE NAVIGATION:  [name] coordinates all of patient's care. Patient receives CAP/DA services. Patient has an aide with [facility] for 40 hours a week. Patient is also receiving home health nursing with [facility] for wound care.  6. FINANCIAL/LEGAL CONCERNS/INTERVENTIONS:  Patient has Medicaid.      SOCIAL HX:  Social History   Tobacco Use  . Smoking status: Never Smoker  . Smokeless tobacco: Never Used  Substance Use Topics  . Alcohol use: No    CODE STATUS:   Code Status: Prior (DNR - needs form) ADVANCED DIRECTIVES: N MOST FORM COMPLETE:  No. HOSPICE EDUCATION PROVIDED: Yes - based on assessment, team feels patient is appropriate for hospice referral. RN contacted PCP and [facility] CMO to discuss. Family in agreement with referral.  PPS: Patient is dependent of all ADLs.  I spent 60 minutes with patient/family, from 9:00-10:00a providing education, support and consultation.   Margaretmary Lombard, LCSW

## 2019-01-14 DIAGNOSIS — R532 Functional quadriplegia: Secondary | ICD-10-CM | POA: Diagnosis not present

## 2019-01-14 DIAGNOSIS — E876 Hypokalemia: Secondary | ICD-10-CM | POA: Diagnosis not present

## 2019-01-14 DIAGNOSIS — Z8719 Personal history of other diseases of the digestive system: Secondary | ICD-10-CM | POA: Diagnosis not present

## 2019-01-14 DIAGNOSIS — E43 Unspecified severe protein-calorie malnutrition: Secondary | ICD-10-CM | POA: Diagnosis not present

## 2019-01-14 DIAGNOSIS — E559 Vitamin D deficiency, unspecified: Secondary | ICD-10-CM | POA: Diagnosis not present

## 2019-01-14 DIAGNOSIS — Z741 Need for assistance with personal care: Secondary | ICD-10-CM | POA: Diagnosis not present

## 2019-01-14 DIAGNOSIS — L89010 Pressure ulcer of right elbow, unstageable: Secondary | ICD-10-CM | POA: Diagnosis not present

## 2019-01-14 DIAGNOSIS — E119 Type 2 diabetes mellitus without complications: Secondary | ICD-10-CM | POA: Diagnosis not present

## 2019-01-14 DIAGNOSIS — F79 Unspecified intellectual disabilities: Secondary | ICD-10-CM | POA: Diagnosis not present

## 2019-01-14 DIAGNOSIS — Q039 Congenital hydrocephalus, unspecified: Secondary | ICD-10-CM | POA: Diagnosis not present

## 2019-01-14 DIAGNOSIS — L89144 Pressure ulcer of left lower back, stage 4: Secondary | ICD-10-CM | POA: Diagnosis not present

## 2019-01-14 DIAGNOSIS — I10 Essential (primary) hypertension: Secondary | ICD-10-CM | POA: Diagnosis not present

## 2019-01-14 DIAGNOSIS — E785 Hyperlipidemia, unspecified: Secondary | ICD-10-CM | POA: Diagnosis not present

## 2019-01-14 DIAGNOSIS — K5909 Other constipation: Secondary | ICD-10-CM | POA: Diagnosis not present

## 2019-01-14 DIAGNOSIS — D649 Anemia, unspecified: Secondary | ICD-10-CM | POA: Diagnosis not present

## 2019-01-14 DIAGNOSIS — R1310 Dysphagia, unspecified: Secondary | ICD-10-CM | POA: Diagnosis not present

## 2019-01-14 DIAGNOSIS — L89154 Pressure ulcer of sacral region, stage 4: Secondary | ICD-10-CM | POA: Diagnosis not present

## 2019-01-14 DIAGNOSIS — G40909 Epilepsy, unspecified, not intractable, without status epilepticus: Secondary | ICD-10-CM | POA: Diagnosis not present

## 2019-01-14 DIAGNOSIS — J45909 Unspecified asthma, uncomplicated: Secondary | ICD-10-CM | POA: Diagnosis not present

## 2019-01-14 DIAGNOSIS — K219 Gastro-esophageal reflux disease without esophagitis: Secondary | ICD-10-CM | POA: Diagnosis not present

## 2019-01-14 DIAGNOSIS — J302 Other seasonal allergic rhinitis: Secondary | ICD-10-CM | POA: Diagnosis not present

## 2019-01-14 DIAGNOSIS — R634 Abnormal weight loss: Secondary | ICD-10-CM | POA: Diagnosis not present

## 2019-01-16 ENCOUNTER — Telehealth: Payer: Self-pay

## 2019-01-16 DIAGNOSIS — I10 Essential (primary) hypertension: Secondary | ICD-10-CM | POA: Diagnosis not present

## 2019-01-16 DIAGNOSIS — E43 Unspecified severe protein-calorie malnutrition: Secondary | ICD-10-CM | POA: Diagnosis not present

## 2019-01-16 DIAGNOSIS — F79 Unspecified intellectual disabilities: Secondary | ICD-10-CM | POA: Diagnosis not present

## 2019-01-16 DIAGNOSIS — E785 Hyperlipidemia, unspecified: Secondary | ICD-10-CM | POA: Diagnosis not present

## 2019-01-16 DIAGNOSIS — Q039 Congenital hydrocephalus, unspecified: Secondary | ICD-10-CM | POA: Diagnosis not present

## 2019-01-16 DIAGNOSIS — R1310 Dysphagia, unspecified: Secondary | ICD-10-CM | POA: Diagnosis not present

## 2019-01-16 NOTE — Telephone Encounter (Signed)
Spoke with April Watson and gave verbal order for Hospice eval

## 2019-01-16 NOTE — Telephone Encounter (Signed)
Please call Virgy back for a verbal order for Hospice Eval

## 2019-01-16 NOTE — Telephone Encounter (Signed)
Returned Virgy's call. No answer. Left generic message asking for a call back.

## 2019-01-17 DIAGNOSIS — R1310 Dysphagia, unspecified: Secondary | ICD-10-CM | POA: Diagnosis not present

## 2019-01-17 DIAGNOSIS — F79 Unspecified intellectual disabilities: Secondary | ICD-10-CM | POA: Diagnosis not present

## 2019-01-17 DIAGNOSIS — Q039 Congenital hydrocephalus, unspecified: Secondary | ICD-10-CM | POA: Diagnosis not present

## 2019-01-17 DIAGNOSIS — I10 Essential (primary) hypertension: Secondary | ICD-10-CM | POA: Diagnosis not present

## 2019-01-17 DIAGNOSIS — E43 Unspecified severe protein-calorie malnutrition: Secondary | ICD-10-CM | POA: Diagnosis not present

## 2019-01-17 DIAGNOSIS — E785 Hyperlipidemia, unspecified: Secondary | ICD-10-CM | POA: Diagnosis not present

## 2019-01-20 DIAGNOSIS — J45909 Unspecified asthma, uncomplicated: Secondary | ICD-10-CM | POA: Diagnosis not present

## 2019-01-20 DIAGNOSIS — E785 Hyperlipidemia, unspecified: Secondary | ICD-10-CM | POA: Diagnosis not present

## 2019-01-20 DIAGNOSIS — D649 Anemia, unspecified: Secondary | ICD-10-CM | POA: Diagnosis not present

## 2019-01-20 DIAGNOSIS — L89154 Pressure ulcer of sacral region, stage 4: Secondary | ICD-10-CM | POA: Diagnosis not present

## 2019-01-20 DIAGNOSIS — E559 Vitamin D deficiency, unspecified: Secondary | ICD-10-CM | POA: Diagnosis not present

## 2019-01-20 DIAGNOSIS — E43 Unspecified severe protein-calorie malnutrition: Secondary | ICD-10-CM | POA: Diagnosis not present

## 2019-01-20 DIAGNOSIS — R532 Functional quadriplegia: Secondary | ICD-10-CM | POA: Diagnosis not present

## 2019-01-20 DIAGNOSIS — E119 Type 2 diabetes mellitus without complications: Secondary | ICD-10-CM | POA: Diagnosis not present

## 2019-01-20 DIAGNOSIS — L89010 Pressure ulcer of right elbow, unstageable: Secondary | ICD-10-CM | POA: Diagnosis not present

## 2019-01-20 DIAGNOSIS — F79 Unspecified intellectual disabilities: Secondary | ICD-10-CM | POA: Diagnosis not present

## 2019-01-20 DIAGNOSIS — G40909 Epilepsy, unspecified, not intractable, without status epilepticus: Secondary | ICD-10-CM | POA: Diagnosis not present

## 2019-01-20 DIAGNOSIS — R1310 Dysphagia, unspecified: Secondary | ICD-10-CM | POA: Diagnosis not present

## 2019-01-20 DIAGNOSIS — Z8719 Personal history of other diseases of the digestive system: Secondary | ICD-10-CM | POA: Diagnosis not present

## 2019-01-20 DIAGNOSIS — L89144 Pressure ulcer of left lower back, stage 4: Secondary | ICD-10-CM | POA: Diagnosis not present

## 2019-01-20 DIAGNOSIS — Q039 Congenital hydrocephalus, unspecified: Secondary | ICD-10-CM | POA: Diagnosis not present

## 2019-01-20 DIAGNOSIS — E876 Hypokalemia: Secondary | ICD-10-CM | POA: Diagnosis not present

## 2019-01-20 DIAGNOSIS — Z741 Need for assistance with personal care: Secondary | ICD-10-CM | POA: Diagnosis not present

## 2019-01-20 DIAGNOSIS — J302 Other seasonal allergic rhinitis: Secondary | ICD-10-CM | POA: Diagnosis not present

## 2019-01-20 DIAGNOSIS — K219 Gastro-esophageal reflux disease without esophagitis: Secondary | ICD-10-CM | POA: Diagnosis not present

## 2019-01-20 DIAGNOSIS — R634 Abnormal weight loss: Secondary | ICD-10-CM | POA: Diagnosis not present

## 2019-01-20 DIAGNOSIS — I10 Essential (primary) hypertension: Secondary | ICD-10-CM | POA: Diagnosis not present

## 2019-01-20 DIAGNOSIS — K5909 Other constipation: Secondary | ICD-10-CM | POA: Diagnosis not present

## 2019-01-26 ENCOUNTER — Telehealth: Payer: Self-pay | Admitting: *Deleted

## 2019-01-26 NOTE — Telephone Encounter (Signed)
April Watson with [facility] called Family is asking to revoke hospice care they would like to go back to pallative care. Langley Gauss can be reached at [phone] option 2

## 2019-01-26 NOTE — Telephone Encounter (Signed)
We received the fax from them with that information and faxed back the drs response this am.

## 2019-01-30 ENCOUNTER — Other Ambulatory Visit: Payer: Self-pay

## 2019-01-30 ENCOUNTER — Encounter: Payer: Self-pay | Admitting: Family Medicine

## 2019-01-30 ENCOUNTER — Telehealth: Payer: Self-pay

## 2019-01-30 ENCOUNTER — Telehealth: Payer: Self-pay | Admitting: *Deleted

## 2019-01-30 ENCOUNTER — Other Ambulatory Visit: Payer: Self-pay | Admitting: Family Medicine

## 2019-01-30 DIAGNOSIS — G8250 Quadriplegia, unspecified: Secondary | ICD-10-CM

## 2019-01-30 MED ORDER — UNABLE TO FIND: 0 refills | Status: AC

## 2019-01-30 NOTE — Telephone Encounter (Signed)
Received phone call from [place] with [facility]. Debbie shared that patient's family has reached out to her regarding resuming Palliative care. Will follow up.

## 2019-01-30 NOTE — Telephone Encounter (Signed)
Renee Rival Case worker with [facility] called pt is needing a new hospital bed sent to [facility]. She knows they usually need the face to face notes so she is wondering if all of this can be sent to [facility] today fax Windy Kalata so that hopefully this will expedite the process as she needs a bed now

## 2019-01-30 NOTE — Telephone Encounter (Signed)
Phone call placed to patient's sister, [name], to provide update that PCP was in agreement to resume Palliative. Also, made [facility] aware that the assigned team will reach out to schedule visit. [name] requested that someone call sooner than later. Palliative teams updated.

## 2019-01-30 NOTE — Telephone Encounter (Signed)
Received ok from PCP to resume Palliative Care.

## 2019-01-30 NOTE — Telephone Encounter (Signed)
Last office notes, face sheet, and prescription for hospital bed faxed to [place] to [place]

## 2019-01-31 ENCOUNTER — Other Ambulatory Visit: Payer: Self-pay

## 2019-01-31 ENCOUNTER — Telehealth: Payer: Self-pay | Admitting: Licensed Clinical Social Worker

## 2019-01-31 MED ORDER — UNABLE TO FIND: 5 refills | Status: AC

## 2019-01-31 MED ORDER — UNABLE TO FIND: 0 refills | Status: DC

## 2019-01-31 MED ORDER — UNABLE TO FIND: 0 refills | Status: AC

## 2019-01-31 MED ORDER — SANTYL 250 UNIT/GM EX OINT: 1.0000 "application " | TOPICAL_OINTMENT | Freq: Three times a day (TID) | CUTANEOUS | 4 refills | Status: AC

## 2019-01-31 MED ORDER — UNABLE TO FIND: 2 refills | Status: AC

## 2019-01-31 NOTE — Telephone Encounter (Signed)
Palliative Care SW contacted patient's sister, [name], and scheduled a home visit for Thursday, 1/14, at 11am.

## 2019-02-02 ENCOUNTER — Telehealth: Payer: Self-pay

## 2019-02-02 ENCOUNTER — Other Ambulatory Visit: Payer: Medicare Other | Admitting: Licensed Clinical Social Worker

## 2019-02-02 ENCOUNTER — Other Ambulatory Visit: Payer: Self-pay

## 2019-02-02 ENCOUNTER — Other Ambulatory Visit: Payer: Medicare Other | Admitting: *Deleted

## 2019-02-02 ENCOUNTER — Telehealth: Payer: Self-pay | Admitting: *Deleted

## 2019-02-02 DIAGNOSIS — Z515 Encounter for palliative care: Secondary | ICD-10-CM

## 2019-02-02 NOTE — Telephone Encounter (Signed)
At the request of [place] RN, PCP contacted to request order for Hospice eval.

## 2019-02-02 NOTE — Telephone Encounter (Signed)
April Watson with [facility] called pt had a decline and family was requesting hospice again. She was wondering if Dr Moshe Cipro would be ok with ordering another hospice evaluation and if she would remain attending. Her cell is [phone] and office is [phone]

## 2019-02-02 NOTE — Telephone Encounter (Signed)
Message sent to Cindie with [name] to provide update that order for hospice eval is being requested.

## 2019-02-02 NOTE — Telephone Encounter (Signed)
Follow up call placed to PCP office regarding request for Hospice Eval. Message left with call back information

## 2019-02-03 ENCOUNTER — Other Ambulatory Visit: Payer: Medicare Other | Admitting: *Deleted

## 2019-02-03 ENCOUNTER — Telehealth: Payer: Self-pay | Admitting: Internal Medicine

## 2019-02-03 ENCOUNTER — Other Ambulatory Visit: Payer: Self-pay

## 2019-02-03 DIAGNOSIS — Z8719 Personal history of other diseases of the digestive system: Secondary | ICD-10-CM | POA: Diagnosis not present

## 2019-02-03 DIAGNOSIS — L89154 Pressure ulcer of sacral region, stage 4: Secondary | ICD-10-CM | POA: Diagnosis not present

## 2019-02-03 DIAGNOSIS — I10 Essential (primary) hypertension: Secondary | ICD-10-CM | POA: Diagnosis not present

## 2019-02-03 DIAGNOSIS — Q039 Congenital hydrocephalus, unspecified: Secondary | ICD-10-CM | POA: Diagnosis not present

## 2019-02-03 DIAGNOSIS — E119 Type 2 diabetes mellitus without complications: Secondary | ICD-10-CM | POA: Diagnosis not present

## 2019-02-03 DIAGNOSIS — L89010 Pressure ulcer of right elbow, unstageable: Secondary | ICD-10-CM | POA: Diagnosis not present

## 2019-02-03 DIAGNOSIS — K219 Gastro-esophageal reflux disease without esophagitis: Secondary | ICD-10-CM | POA: Diagnosis not present

## 2019-02-03 DIAGNOSIS — F79 Unspecified intellectual disabilities: Secondary | ICD-10-CM | POA: Diagnosis not present

## 2019-02-03 DIAGNOSIS — J45909 Unspecified asthma, uncomplicated: Secondary | ICD-10-CM | POA: Diagnosis not present

## 2019-02-03 DIAGNOSIS — E43 Unspecified severe protein-calorie malnutrition: Secondary | ICD-10-CM | POA: Diagnosis not present

## 2019-02-03 DIAGNOSIS — Z741 Need for assistance with personal care: Secondary | ICD-10-CM | POA: Diagnosis not present

## 2019-02-03 DIAGNOSIS — Z7401 Bed confinement status: Secondary | ICD-10-CM | POA: Diagnosis not present

## 2019-02-03 DIAGNOSIS — D649 Anemia, unspecified: Secondary | ICD-10-CM | POA: Diagnosis not present

## 2019-02-03 DIAGNOSIS — R532 Functional quadriplegia: Secondary | ICD-10-CM | POA: Diagnosis not present

## 2019-02-03 DIAGNOSIS — E559 Vitamin D deficiency, unspecified: Secondary | ICD-10-CM | POA: Diagnosis not present

## 2019-02-03 DIAGNOSIS — L89324 Pressure ulcer of left buttock, stage 4: Secondary | ICD-10-CM | POA: Diagnosis not present

## 2019-02-03 DIAGNOSIS — Z515 Encounter for palliative care: Secondary | ICD-10-CM

## 2019-02-03 DIAGNOSIS — R634 Abnormal weight loss: Secondary | ICD-10-CM | POA: Diagnosis not present

## 2019-02-03 DIAGNOSIS — R569 Unspecified convulsions: Secondary | ICD-10-CM | POA: Diagnosis not present

## 2019-02-03 DIAGNOSIS — R1310 Dysphagia, unspecified: Secondary | ICD-10-CM | POA: Diagnosis not present

## 2019-02-03 DIAGNOSIS — J302 Other seasonal allergic rhinitis: Secondary | ICD-10-CM | POA: Diagnosis not present

## 2019-02-03 DIAGNOSIS — E785 Hyperlipidemia, unspecified: Secondary | ICD-10-CM | POA: Diagnosis not present

## 2019-02-03 DIAGNOSIS — L89114 Pressure ulcer of right upper back, stage 4: Secondary | ICD-10-CM | POA: Diagnosis not present

## 2019-02-03 NOTE — Telephone Encounter (Signed)
April Watson case management for [facility] called needing the verbal order for hospice as pt was really declining she is no longer eating or drinking. The verbal order can be left with Inez Catalina as noted below or April Watson at [phone]

## 2019-02-03 NOTE — Telephone Encounter (Signed)
10am:   TC from [facility] Palliative care PMPM liaison [facility] RN, who reports patient with signs of pain with wound dressing changes. Patient is now unable to swallow oral pain medications, and over the last two days has been unable to consume food or liquids. Monishia has obtained a hospice referral from patient's PCP Dr. Moshe Cipro.   I am ordering Morphine concentrate solution 20mg /ml, 1/4 ml (5mg ) q4hr prn signs/symptoms of pain, and premed before dressing changes. E-Prescribed to [facility].  Violeta Gelinas NP-C [phone]

## 2019-02-03 NOTE — Progress Notes (Signed)
COMMUNITY PALLIATIVE CARE SW NOTE  PATIENT NAME: April Watson DOB: 25-Mar-1960 MRN: [id number]  PRIMARY CARE PROVIDER: Fayrene Helper, MD  RESPONSIBLE PARTY:  Acct ID - Guarantor Home Phone Work Phone Relationship Acct Type  [id number] LAMANDA, [id number]  Self P/F     [place], [place], [place] [address]     PLAN OF CARE and INTERVENTIONS:             1. GOALS OF CARE/ ADVANCE CARE PLANNING:  Goal is for patient to remain in her home and to be comfortable.  She has a DNR. 2. SOCIAL/EMOTIONAL/SPIRITUAL ASSESSMENT/ INTERVENTIONS:  SW and Palliative Care RN, Daryl Eastern, met with patient, her sister, [name], her brother, Inocente Salles, his wife, and [facility] CNA, Modena Nunnery, in patient's home.  Patient was lying in bed.  She gave good eye contact and displayed a bright affect.  She has been nonverbal.  The family is struggling with patient's possibility of being admitted to Hospice.  Also discussed the affects or morphine which Sam initiated.  Sam is an Loss adjuster, chartered.  Patient's other sister died two years ago under [place].  Provided active listening and supportive counseling.  After much discussion and education, the family decided to elect Hospice.  RN to follow up. 3. PATIENT/CAREGIVER EDUCATION/ COPING:  Provided education regarding Hospice.  [name] copes by problem-solving. 4. PERSONAL EMERGENCY PLAN:  Family will call EMS. 5. COMMUNITY RESOURCES COORDINATION/ HEALTH CARE NAVIGATION:  [facility] CNA, Lea, provides care six days a week. 6. FINANCIAL/LEGAL CONCERNS/INTERVENTIONS:  Patient is on a fixed income.     SOCIAL HX:  Social History   Tobacco Use  . Smoking status: Never Smoker  . Smokeless tobacco: Never Used  Substance Use Topics  . Alcohol use: No    CODE STATUS:  DNR ADVANCED DIRECTIVES:  Y MOST FORM COMPLETE:  N HOSPICE EDUCATION PROVIDED:  Y PPS:  Patient has not eaten today and is bed bound. Duration of visit and documentation:  120  minutes.    Creola Corn [place] Les[place], LCSW

## 2019-02-03 NOTE — Telephone Encounter (Signed)
I have given the verabl OK for referral to hospice of [facility]

## 2019-02-03 NOTE — Progress Notes (Signed)
COMMUNITY PALLIATIVE CARE RN NOTE  PATIENT NAME: April Watson DOB: 21-Jul-1960 MRN: [id number]  PRIMARY CARE PROVIDER: Fayrene Helper, MD  RESPONSIBLE PARTY: Lindwood Qua (sister) Acct ID - Guarantor Home Phone Work Phone Relationship Acct Type  [id number] ELIA, [id number]  Self P/F     [place], [place], [place] [address]   Due to the COVID-19 crisis, this virtual check-in visit was done via telephone from my office and it was initiated and consent by this patient and or family.  PLAN OF CARE and INTERVENTION:  1. ADVANCE CARE PLANNING/GOALS OF CARE: Goal is for patient to be comfortable. She has a DNR. 2. PATIENT/CAREGIVER EDUCATION: Pain Management/Symptom Management 3. DISEASE STATUS: Virtual check-in visit completed via telephone. [name] is able to provide patient update. She reports that she was able to give patient a few sips of fluids last pm and a few bites of jello. She continues to be unable to administer her medications. She is at high risk for Aspiration. Patient is experiencing pain with dressing changes. [name] has been unable to give patient her Tramadol today, even crushed in applesauce/pudding d/t dysphagia. She continues with chest congestion and unable to expectorate phlegm in order for her to be suctioned d/t weakness. She did say that patient is slightly more alert today, than noted on yesterday's visit, but she is still weak. I spoke with Palliative NP, Violeta Gelinas, to request an order for Roxanol to administer prior to dressing changes. NP sent prescription to [facility]. I made [facility] aware. After this intervention, I received a call from patient's PCP, Dr. Moshe Cipro, giving me the verbal order for a hospice consult. This information was sent over to our referral center. Hospice Admission visit scheduled for today, 02/03/19 at 3:30 pm. Family expressed the need for dressing supplies. Went to office and placed available supplies in the referral  center's office for hospice Admissions Nurse to take to patient today on his visit. Family is appreciative.   HISTORY OF PRESENT ILLNESS:  This is a 59 yo female who resides with her sister. Patient to transition to hospice services today.  CODE STATUS: DNR ADVANCED DIRECTIVES: Y MOST FORM: no PPS: 20%   (Duration of visit and documentation 45 minutes)   Daryl Eastern, RN BSN

## 2019-02-03 NOTE — Progress Notes (Signed)
COMMUNITY PALLIATIVE CARE RN NOTE  PATIENT NAME: April Watson DOB: 01-20-1961 MRN: [id number]  PRIMARY CARE PROVIDER: Fayrene Helper, MD  RESPONSIBLE PARTY: Lindwood Qua (sister) Acct ID - Guarantor Home Phone Work Phone Relationship Acct Type  [id number] CULLEN, VANALLEN* [phone]  Self P/F     [place], [place], [place] [address]   Covid-19 Pre-screening Negative  PLAN OF CARE and INTERVENTION:  1. ADVANCE CARE PLANNING/GOALS OF CARE: Goal is for patient to remain in the home. Sister wants patient to remain comfortable. DNR form given to the family as requested. 2. PATIENT/CAREGIVER EDUCATION: Explained Palliative care services vs Hospice 3. DISEASE STATUS: Joint visit made with Palliative care SW, Lynn Duffy. Met with patient's sister, [name], brother Inocente Salles, his wife [name], and hired caregiver Lea. Family is concerned that patient continues to decline. Since last night, they have been unable to get patient to eat or drink anything. She has been unable to place her lips around a straw in order to drink. They tried using a spoon to give patient food/fluids, however patient has just been keeping her mouth open and pocketing food. This is a new symptom. They have also been unable to administer any of her medications in fear of patient choking or aspirating. Upon arrival, patient is lying in bed awake. She is able to make eye contact and smile. She is non-verbal, but able to communicate by nodding yes or no, and she was able to nod to questions today. She has been experiencing some coughing and congestion, however up until this point, patient was able to cough and at least expectorate phlegm into her mouth where she could be suctioned. She is too weak to cough anything up today. She has rhonchi noted in all lung fields and is trying to clear secretions in her throat. However, her breathing is unlabored at this time. She is total care with all ADLs. She is non-ambulatory and transferred via  [place], however has been solely in the bed for over a month d/t increased generalized weakness. Bilateral legs are contracted. She is incontinent of both bowel and bladder. She has a Purewick system for urinary incontinence and a rectal tube, as her stools are loose. She has Stage 4 wounds noted to her left sacrum, right mid back and unstageable wound to her R elbow. Family is changing dressings daily. At end of visit, she was able to take a few sips of a [place] chocolate shake. She did have some coughing and clearing of her throat afterwards. Due to patient's current condition, I feel that she is more appropriate for hospice services. Family is in agreement. Palliative Clinical Navigator, Maxwell Caul, placed call to her PCP to obtain new order for a hospice consult. Explained referral process. Family appreciative and agreeable to hospice care.  HISTORY OF PRESENT ILLNESS: This is a 59 yo female who lives with her sister, who cares for her. She has a very supportive family. Palliative care was asked to follow patient for additional support. Will transition patient to hospice services once verbal order for hospice consult is received.    CODE STATUS: DNR (form left in the home today) ADVANCED DIRECTIVES: N MOST FORM: no PPS: 20%   PHYSICAL EXAM:   VITALS: Today's Vitals   02/02/19 1209  BP: 104/75  Pulse: (!) 118  Resp: 20  Temp: (!) 97 F (36.1 C)  TempSrc: Temporal  SpO2: 96%  PainSc: 0-No pain    LUNGS: scattered rhonchi bilaterally CARDIAC: Cor  Tachy EXTREMITIES: No edema SKIN: Wounds noted above  NEURO: Alert, nonverbal, progressive generalized weakness, non-ambulatory   (Duration of visit and documentation 120 minutes)   Daryl Eastern, RN BSN

## 2019-02-06 DIAGNOSIS — E785 Hyperlipidemia, unspecified: Secondary | ICD-10-CM | POA: Diagnosis not present

## 2019-02-06 DIAGNOSIS — F79 Unspecified intellectual disabilities: Secondary | ICD-10-CM | POA: Diagnosis not present

## 2019-02-06 DIAGNOSIS — E43 Unspecified severe protein-calorie malnutrition: Secondary | ICD-10-CM | POA: Diagnosis not present

## 2019-02-06 DIAGNOSIS — R1310 Dysphagia, unspecified: Secondary | ICD-10-CM | POA: Diagnosis not present

## 2019-02-06 DIAGNOSIS — Q039 Congenital hydrocephalus, unspecified: Secondary | ICD-10-CM | POA: Diagnosis not present

## 2019-02-06 DIAGNOSIS — I10 Essential (primary) hypertension: Secondary | ICD-10-CM | POA: Diagnosis not present

## 2019-02-07 DIAGNOSIS — R1310 Dysphagia, unspecified: Secondary | ICD-10-CM | POA: Diagnosis not present

## 2019-02-07 DIAGNOSIS — E785 Hyperlipidemia, unspecified: Secondary | ICD-10-CM | POA: Diagnosis not present

## 2019-02-07 DIAGNOSIS — F79 Unspecified intellectual disabilities: Secondary | ICD-10-CM | POA: Diagnosis not present

## 2019-02-07 DIAGNOSIS — I10 Essential (primary) hypertension: Secondary | ICD-10-CM | POA: Diagnosis not present

## 2019-02-07 DIAGNOSIS — Q039 Congenital hydrocephalus, unspecified: Secondary | ICD-10-CM | POA: Diagnosis not present

## 2019-02-07 DIAGNOSIS — E43 Unspecified severe protein-calorie malnutrition: Secondary | ICD-10-CM | POA: Diagnosis not present

## 2019-02-08 DIAGNOSIS — I10 Essential (primary) hypertension: Secondary | ICD-10-CM | POA: Diagnosis not present

## 2019-02-08 DIAGNOSIS — Q039 Congenital hydrocephalus, unspecified: Secondary | ICD-10-CM | POA: Diagnosis not present

## 2019-02-08 DIAGNOSIS — E43 Unspecified severe protein-calorie malnutrition: Secondary | ICD-10-CM | POA: Diagnosis not present

## 2019-02-08 DIAGNOSIS — R1310 Dysphagia, unspecified: Secondary | ICD-10-CM | POA: Diagnosis not present

## 2019-02-08 DIAGNOSIS — F79 Unspecified intellectual disabilities: Secondary | ICD-10-CM | POA: Diagnosis not present

## 2019-02-08 DIAGNOSIS — E785 Hyperlipidemia, unspecified: Secondary | ICD-10-CM | POA: Diagnosis not present

## 2019-02-09 DIAGNOSIS — F79 Unspecified intellectual disabilities: Secondary | ICD-10-CM | POA: Diagnosis not present

## 2019-02-09 DIAGNOSIS — E785 Hyperlipidemia, unspecified: Secondary | ICD-10-CM | POA: Diagnosis not present

## 2019-02-09 DIAGNOSIS — Q039 Congenital hydrocephalus, unspecified: Secondary | ICD-10-CM | POA: Diagnosis not present

## 2019-02-09 DIAGNOSIS — R1310 Dysphagia, unspecified: Secondary | ICD-10-CM | POA: Diagnosis not present

## 2019-02-09 DIAGNOSIS — E43 Unspecified severe protein-calorie malnutrition: Secondary | ICD-10-CM | POA: Diagnosis not present

## 2019-02-09 DIAGNOSIS — I10 Essential (primary) hypertension: Secondary | ICD-10-CM | POA: Diagnosis not present

## 2019-02-14 DIAGNOSIS — I10 Essential (primary) hypertension: Secondary | ICD-10-CM | POA: Diagnosis not present

## 2019-02-14 DIAGNOSIS — Q039 Congenital hydrocephalus, unspecified: Secondary | ICD-10-CM | POA: Diagnosis not present

## 2019-02-14 DIAGNOSIS — F79 Unspecified intellectual disabilities: Secondary | ICD-10-CM | POA: Diagnosis not present

## 2019-02-14 DIAGNOSIS — E43 Unspecified severe protein-calorie malnutrition: Secondary | ICD-10-CM | POA: Diagnosis not present

## 2019-02-14 DIAGNOSIS — E785 Hyperlipidemia, unspecified: Secondary | ICD-10-CM | POA: Diagnosis not present

## 2019-02-14 DIAGNOSIS — R1310 Dysphagia, unspecified: Secondary | ICD-10-CM | POA: Diagnosis not present

## 2019-02-15 DIAGNOSIS — F79 Unspecified intellectual disabilities: Secondary | ICD-10-CM | POA: Diagnosis not present

## 2019-02-15 DIAGNOSIS — E785 Hyperlipidemia, unspecified: Secondary | ICD-10-CM | POA: Diagnosis not present

## 2019-02-15 DIAGNOSIS — I10 Essential (primary) hypertension: Secondary | ICD-10-CM | POA: Diagnosis not present

## 2019-02-15 DIAGNOSIS — Q039 Congenital hydrocephalus, unspecified: Secondary | ICD-10-CM | POA: Diagnosis not present

## 2019-02-15 DIAGNOSIS — R1310 Dysphagia, unspecified: Secondary | ICD-10-CM | POA: Diagnosis not present

## 2019-02-15 DIAGNOSIS — E43 Unspecified severe protein-calorie malnutrition: Secondary | ICD-10-CM | POA: Diagnosis not present

## 2019-02-19 DIAGNOSIS — R1310 Dysphagia, unspecified: Secondary | ICD-10-CM | POA: Diagnosis not present

## 2019-02-19 DIAGNOSIS — F79 Unspecified intellectual disabilities: Secondary | ICD-10-CM | POA: Diagnosis not present

## 2019-02-19 DIAGNOSIS — E43 Unspecified severe protein-calorie malnutrition: Secondary | ICD-10-CM | POA: Diagnosis not present

## 2019-02-19 DIAGNOSIS — Q039 Congenital hydrocephalus, unspecified: Secondary | ICD-10-CM | POA: Diagnosis not present

## 2019-02-19 DIAGNOSIS — E785 Hyperlipidemia, unspecified: Secondary | ICD-10-CM | POA: Diagnosis not present

## 2019-02-19 DIAGNOSIS — I10 Essential (primary) hypertension: Secondary | ICD-10-CM | POA: Diagnosis not present

## 2019-02-20 DIAGNOSIS — L89010 Pressure ulcer of right elbow, unstageable: Secondary | ICD-10-CM | POA: Diagnosis not present

## 2019-02-20 DIAGNOSIS — I10 Essential (primary) hypertension: Secondary | ICD-10-CM | POA: Diagnosis not present

## 2019-02-20 DIAGNOSIS — K219 Gastro-esophageal reflux disease without esophagitis: Secondary | ICD-10-CM | POA: Diagnosis not present

## 2019-02-20 DIAGNOSIS — R1310 Dysphagia, unspecified: Secondary | ICD-10-CM | POA: Diagnosis not present

## 2019-02-20 DIAGNOSIS — E559 Vitamin D deficiency, unspecified: Secondary | ICD-10-CM | POA: Diagnosis not present

## 2019-02-20 DIAGNOSIS — E785 Hyperlipidemia, unspecified: Secondary | ICD-10-CM | POA: Diagnosis not present

## 2019-02-20 DIAGNOSIS — R634 Abnormal weight loss: Secondary | ICD-10-CM | POA: Diagnosis not present

## 2019-02-20 DIAGNOSIS — L89154 Pressure ulcer of sacral region, stage 4: Secondary | ICD-10-CM | POA: Diagnosis not present

## 2019-02-20 DIAGNOSIS — E43 Unspecified severe protein-calorie malnutrition: Secondary | ICD-10-CM | POA: Diagnosis not present

## 2019-02-20 DIAGNOSIS — R532 Functional quadriplegia: Secondary | ICD-10-CM | POA: Diagnosis not present

## 2019-02-20 DIAGNOSIS — R569 Unspecified convulsions: Secondary | ICD-10-CM | POA: Diagnosis not present

## 2019-02-20 DIAGNOSIS — Z741 Need for assistance with personal care: Secondary | ICD-10-CM | POA: Diagnosis not present

## 2019-02-20 DIAGNOSIS — E119 Type 2 diabetes mellitus without complications: Secondary | ICD-10-CM | POA: Diagnosis not present

## 2019-02-20 DIAGNOSIS — D649 Anemia, unspecified: Secondary | ICD-10-CM | POA: Diagnosis not present

## 2019-02-20 DIAGNOSIS — L89114 Pressure ulcer of right upper back, stage 4: Secondary | ICD-10-CM | POA: Diagnosis not present

## 2019-02-20 DIAGNOSIS — J45909 Unspecified asthma, uncomplicated: Secondary | ICD-10-CM | POA: Diagnosis not present

## 2019-02-20 DIAGNOSIS — J302 Other seasonal allergic rhinitis: Secondary | ICD-10-CM | POA: Diagnosis not present

## 2019-02-20 DIAGNOSIS — Z8719 Personal history of other diseases of the digestive system: Secondary | ICD-10-CM | POA: Diagnosis not present

## 2019-02-20 DIAGNOSIS — Q039 Congenital hydrocephalus, unspecified: Secondary | ICD-10-CM | POA: Diagnosis not present

## 2019-02-20 DIAGNOSIS — Z7401 Bed confinement status: Secondary | ICD-10-CM | POA: Diagnosis not present

## 2019-02-20 DIAGNOSIS — L89324 Pressure ulcer of left buttock, stage 4: Secondary | ICD-10-CM | POA: Diagnosis not present

## 2019-02-20 DIAGNOSIS — F79 Unspecified intellectual disabilities: Secondary | ICD-10-CM | POA: Diagnosis not present

## 2019-02-21 ENCOUNTER — Telehealth: Payer: Self-pay

## 2019-02-21 ENCOUNTER — Other Ambulatory Visit: Payer: Self-pay

## 2019-02-21 DIAGNOSIS — I10 Essential (primary) hypertension: Secondary | ICD-10-CM | POA: Diagnosis not present

## 2019-02-21 DIAGNOSIS — R1310 Dysphagia, unspecified: Secondary | ICD-10-CM | POA: Diagnosis not present

## 2019-02-21 DIAGNOSIS — F79 Unspecified intellectual disabilities: Secondary | ICD-10-CM | POA: Diagnosis not present

## 2019-02-21 DIAGNOSIS — E43 Unspecified severe protein-calorie malnutrition: Secondary | ICD-10-CM | POA: Diagnosis not present

## 2019-02-21 DIAGNOSIS — Q039 Congenital hydrocephalus, unspecified: Secondary | ICD-10-CM | POA: Diagnosis not present

## 2019-02-21 DIAGNOSIS — E785 Hyperlipidemia, unspecified: Secondary | ICD-10-CM | POA: Diagnosis not present

## 2019-02-21 MED ORDER — POLYETHYLENE GLYCOL 3350 17 GM/SCOOP PO POWD: 17.0000 g | Freq: Two times a day (BID) | ORAL | 5 refills | Status: AC | PRN

## 2019-03-20 NOTE — Telephone Encounter (Signed)
Please send Miralax as a prescription to [facility] so Hospice will pay for it

## 2019-03-20 NOTE — Telephone Encounter (Signed)
Miralax rx sent in

## 2019-03-20 DEATH — deceased

## 2019-05-03 ENCOUNTER — Ambulatory Visit: Payer: Medicare Other | Admitting: Family Medicine

## 2019-10-31 ENCOUNTER — Encounter: Payer: Medicare Other | Admitting: Family Medicine

## 2019-11-21 IMAGING — CT CT ABDOMEN AND PELVIS WITHOUT CONTRAST
3 of 5 series · 16 of 46 positions shown, 18 images · non-contrast
Comparison: Barium enema 05/14/2003

CLINICAL DATA: Abdomen distension

EXAM:
CT ABDOMEN AND PELVIS WITHOUT CONTRAST
TECHNIQUE: Multidetector CT imaging of the abdomen and pelvis was performed
following the standard protocol without IV contrast.

[Series 4: lung bases · axial · 0.93mm/px · z∈[+1382,+1398]mm · 2 of 47 slices shown]
[im 8/47  bone]
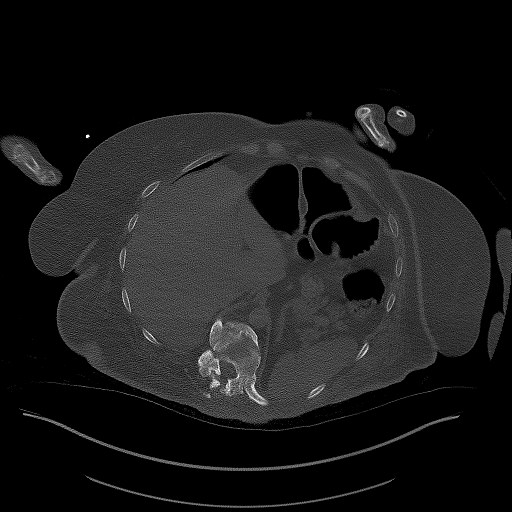
[im 16/47  bone]
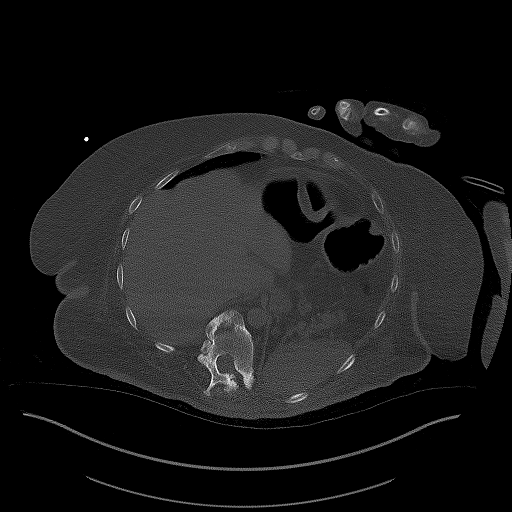

[Series 5: coronal st · coronal · 0.89mm/px · 3 of 125 slices shown]
[im 42/125  soft-tissue]
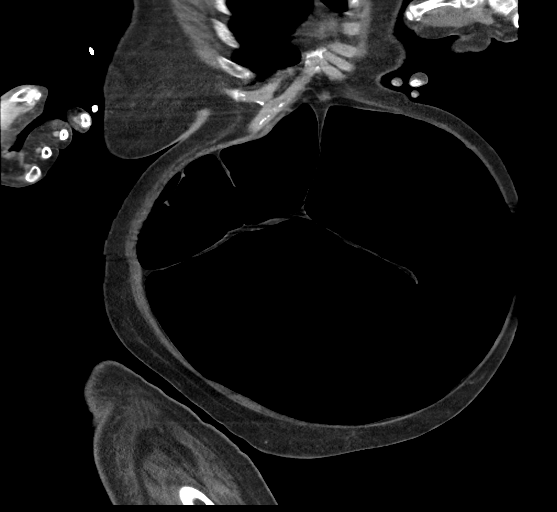
[im 56/125  soft-tissue]
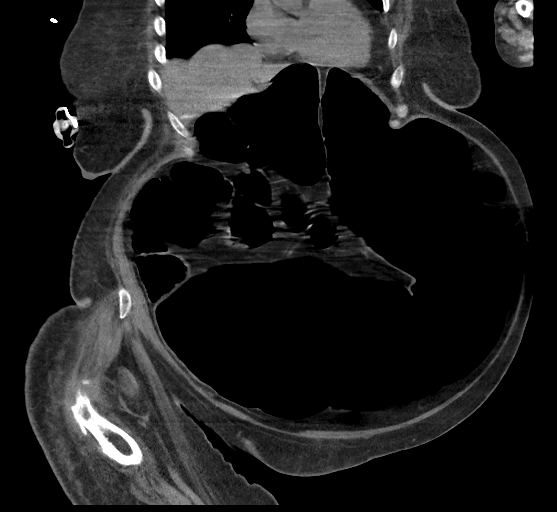
[im 69/125  soft-tissue]
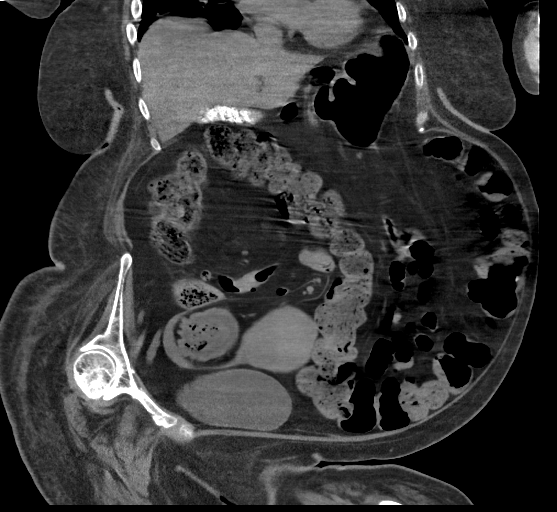

[Series 7: axial st · axial · 0.92mm/px · z∈[+1051,+1421]mm · 11 of 90 slices shown, 13 images]
[im 8/90  soft-tissue]
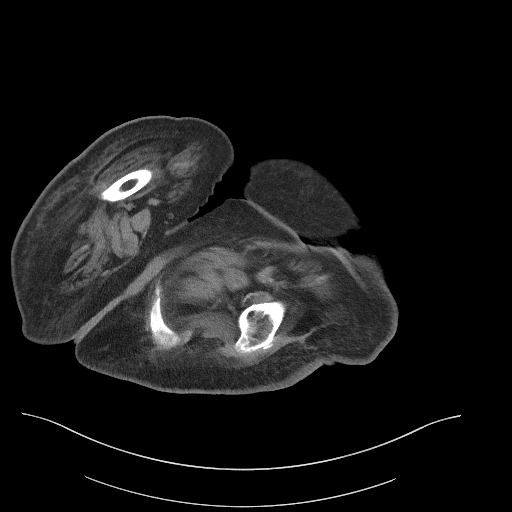
[im 8/90  bone]
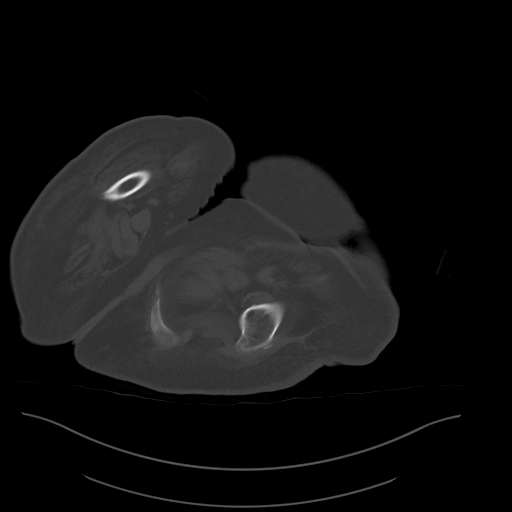
[im 15/90  soft-tissue]
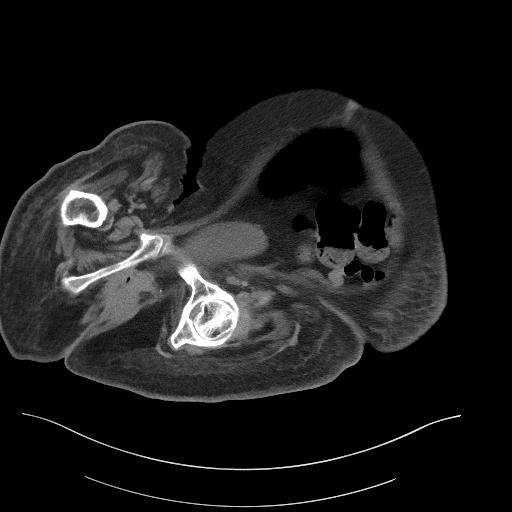
[im 23/90  soft-tissue]
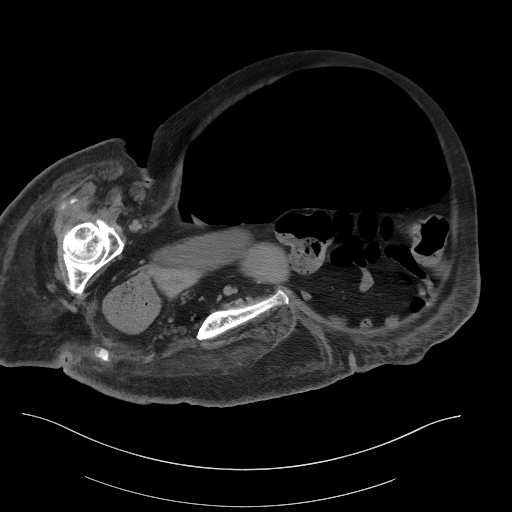
[im 30/90  soft-tissue]
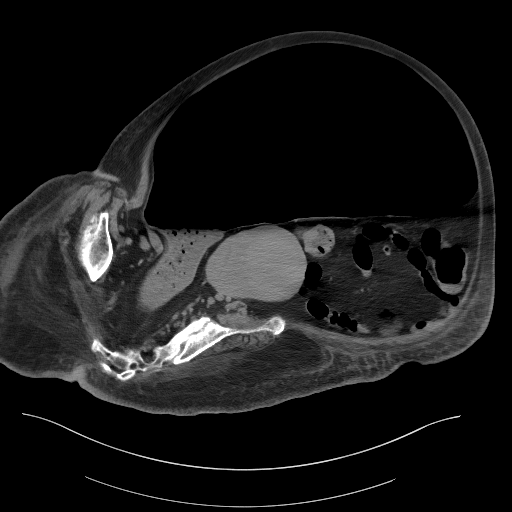
[im 38/90  soft-tissue]
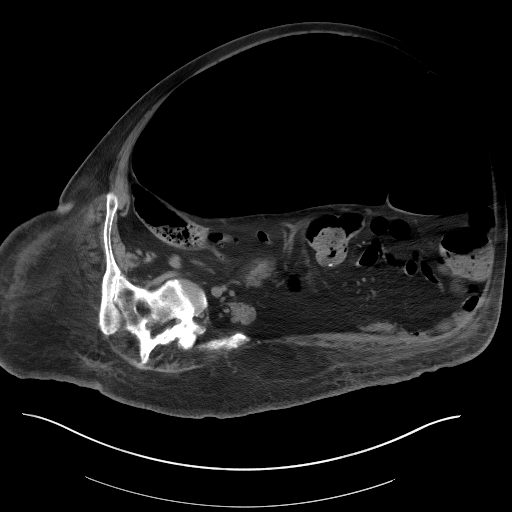
[im 45/90  soft-tissue]
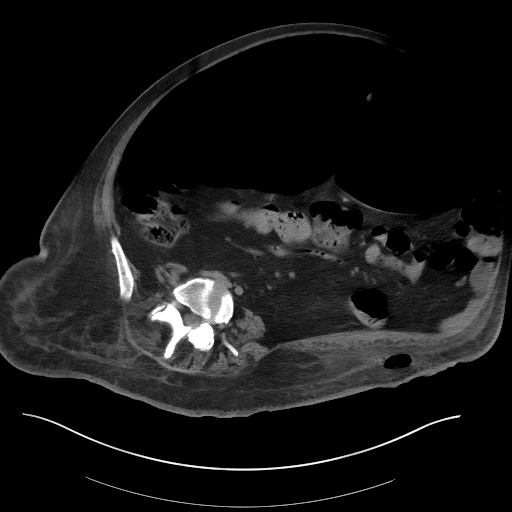
[im 52/90  soft-tissue]
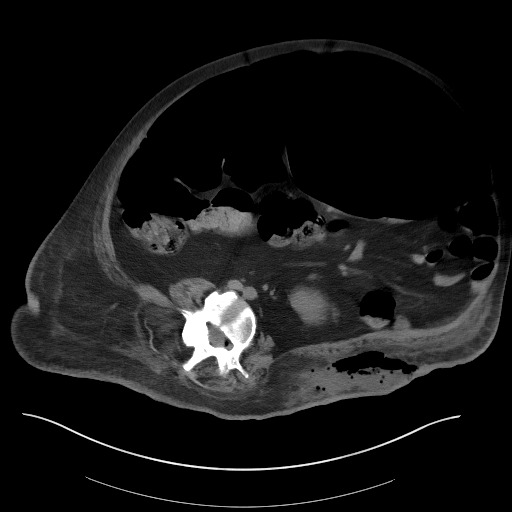
[im 60/90  soft-tissue]
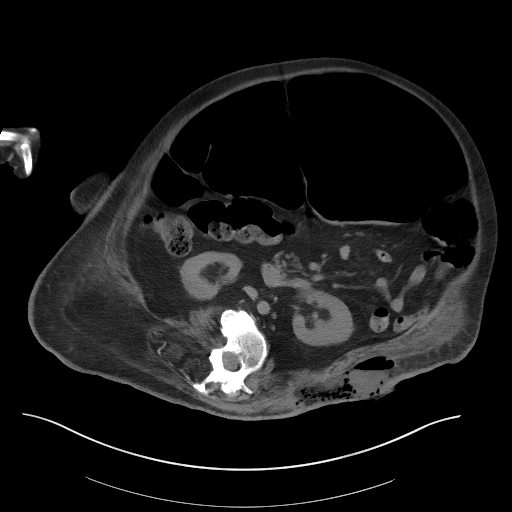
[im 67/90  soft-tissue]
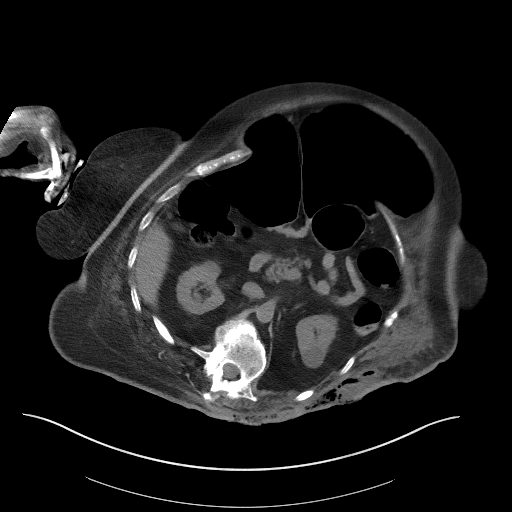
[im 67/90  bone]
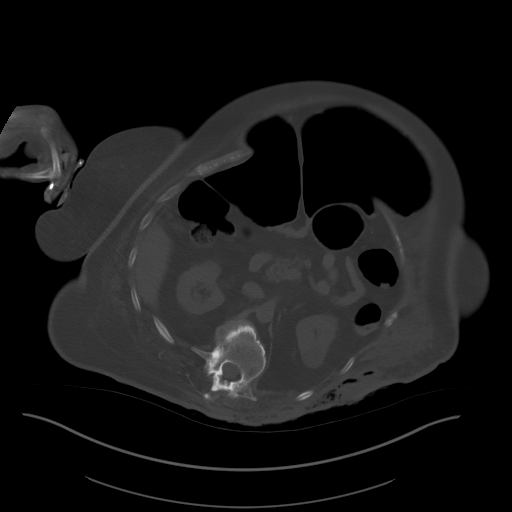
[im 75/90  soft-tissue]
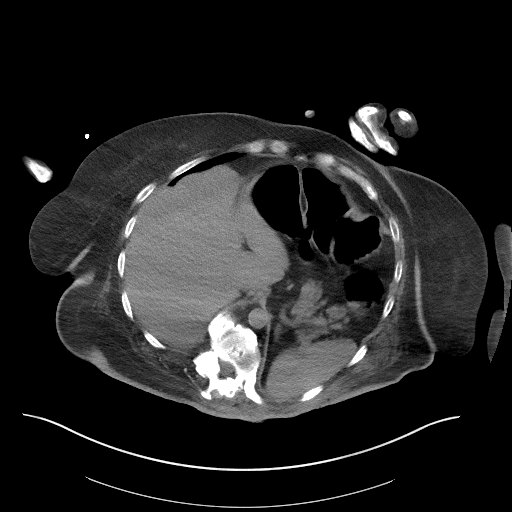
[im 82/90  soft-tissue]
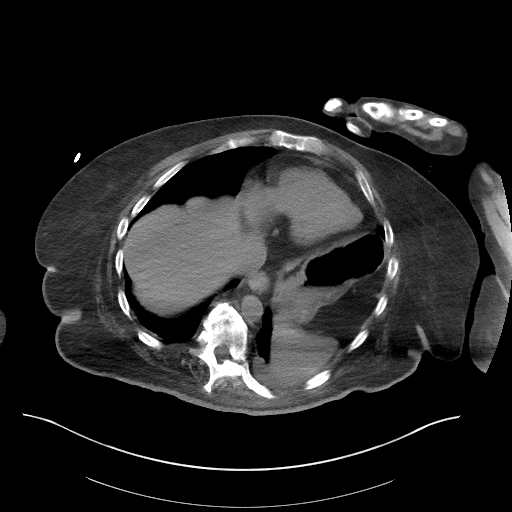

[16 of 46 positions shown; findings below may reference images not displayed]

FINDINGS: Lower chest: Lung bases demonstrate partial atelectasis at the left
base. Trace left pleural effusion. Heart size within normal limits.

Hepatobiliary: No focal hepatic abnormality. Numerous calcified
gallstones. No biliary dilatation.

Pancreas: Unremarkable. No pancreatic ductal dilatation or
surrounding inflammatory changes.

Spleen: Normal in size without focal abnormality.

Adrenals/Urinary Tract: Adrenal glands are normal. Kidneys show no
hydronephrosis. Probable cyst upper pole left kidney. Urinary
bladder is unremarkable.

Stomach/Bowel: The stomach is nonenlarged. No dilated small bowel.
Extremely tortuous and redundant colon. No colon wall thickening.
Massive distension of the sigmoid colon up to 16 cm with fluid
appearing stools in the rectum. Negative appendix.

Vascular/Lymphatic: Nonaneurysmal aorta. No significantly enlarged
lymph nodes.

Reproductive: Enlarged uterus with suspected fundal mass measuring
7.8 cm. No definite adnexal mass.

Other: No free air or free fluid.

Musculoskeletal: Scoliosis and degenerative changes of the spine.
Chronic appearing bony remodeling of the proximal left femur. Skin
thickening and irregular soft tissue density left lateral hip
extending toward the femoral trochanter which may reflect decubitus
ulcer. No acute gas or fluid in this region.

Left upper back soft tissue wound or ulcer with subcutaneous edema
and multiple foci of gas. This is contiguous with a large gas and
fluid collection measuring at least 3.4 x 9.7 cm within the left
flank subcutaneous soft tissues. No gross bony destructive change.
IMPRESSION: 1. Anatomy somewhat distorted by patient's scoliosis. There is
massive distention of the sigmoid colon which is contiguous with
fluid filled rectum. No discrete colonic narrowing or evidence for
volvulus. Findings suggest functional obstruction at the level of
the rectosigmoid colon. Negative for perforation.
2. Large wound involving the skin and subcutaneous tissues of the
left back and flank region with multiple foci of gas. Dominant fluid
and gas collection in the left flank soft tissues measuring at least
9.7 cm, concerning for abscess or potential necrotic infection.
Recommend correlation with direct inspection.
3. Numerous gallstones
4. Bulky uterus with suspected fibroids.

## 2020-04-05 IMAGING — DX DG CHEST 1V PORT
2 series · 2 of 2 positions shown · non-contrast
Comparison: 10/04/2018

CLINICAL DATA: Dysphagia

EXAM:
PORTABLE CHEST 1 VIEW

[chest ap (1 of 2)]
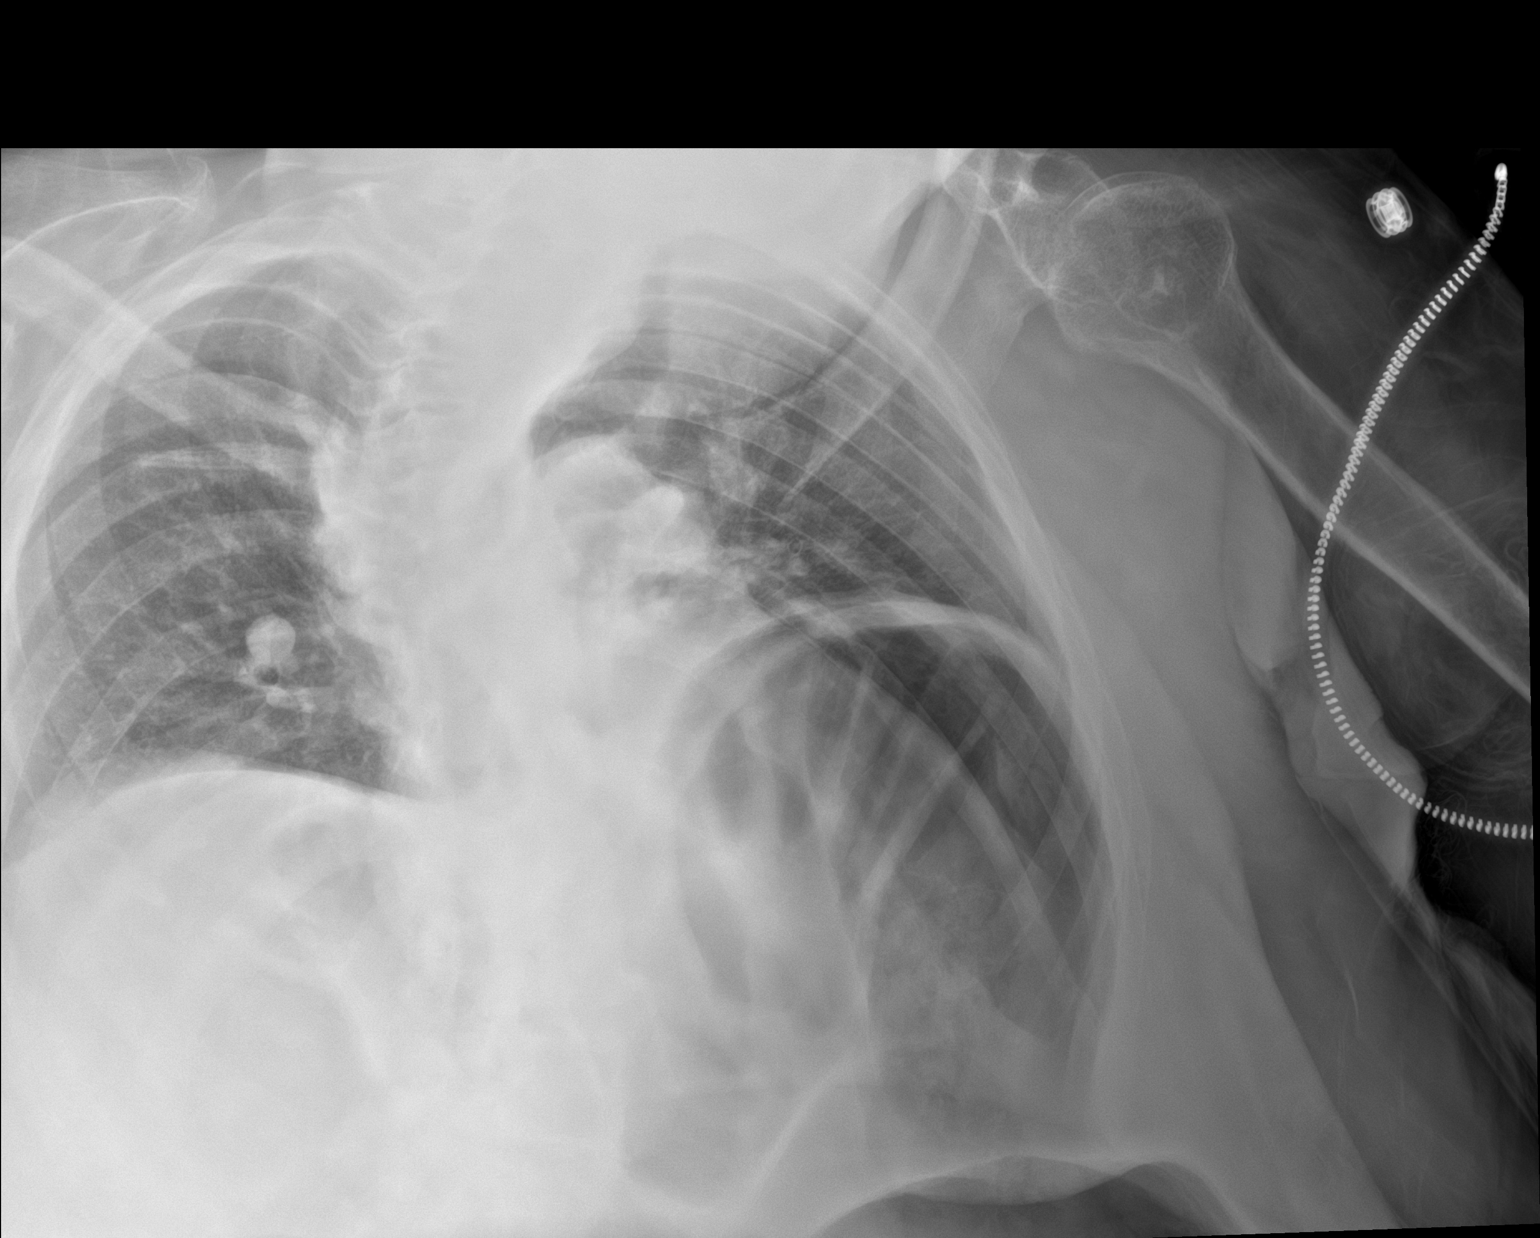

[chest ap (2 of 2)]
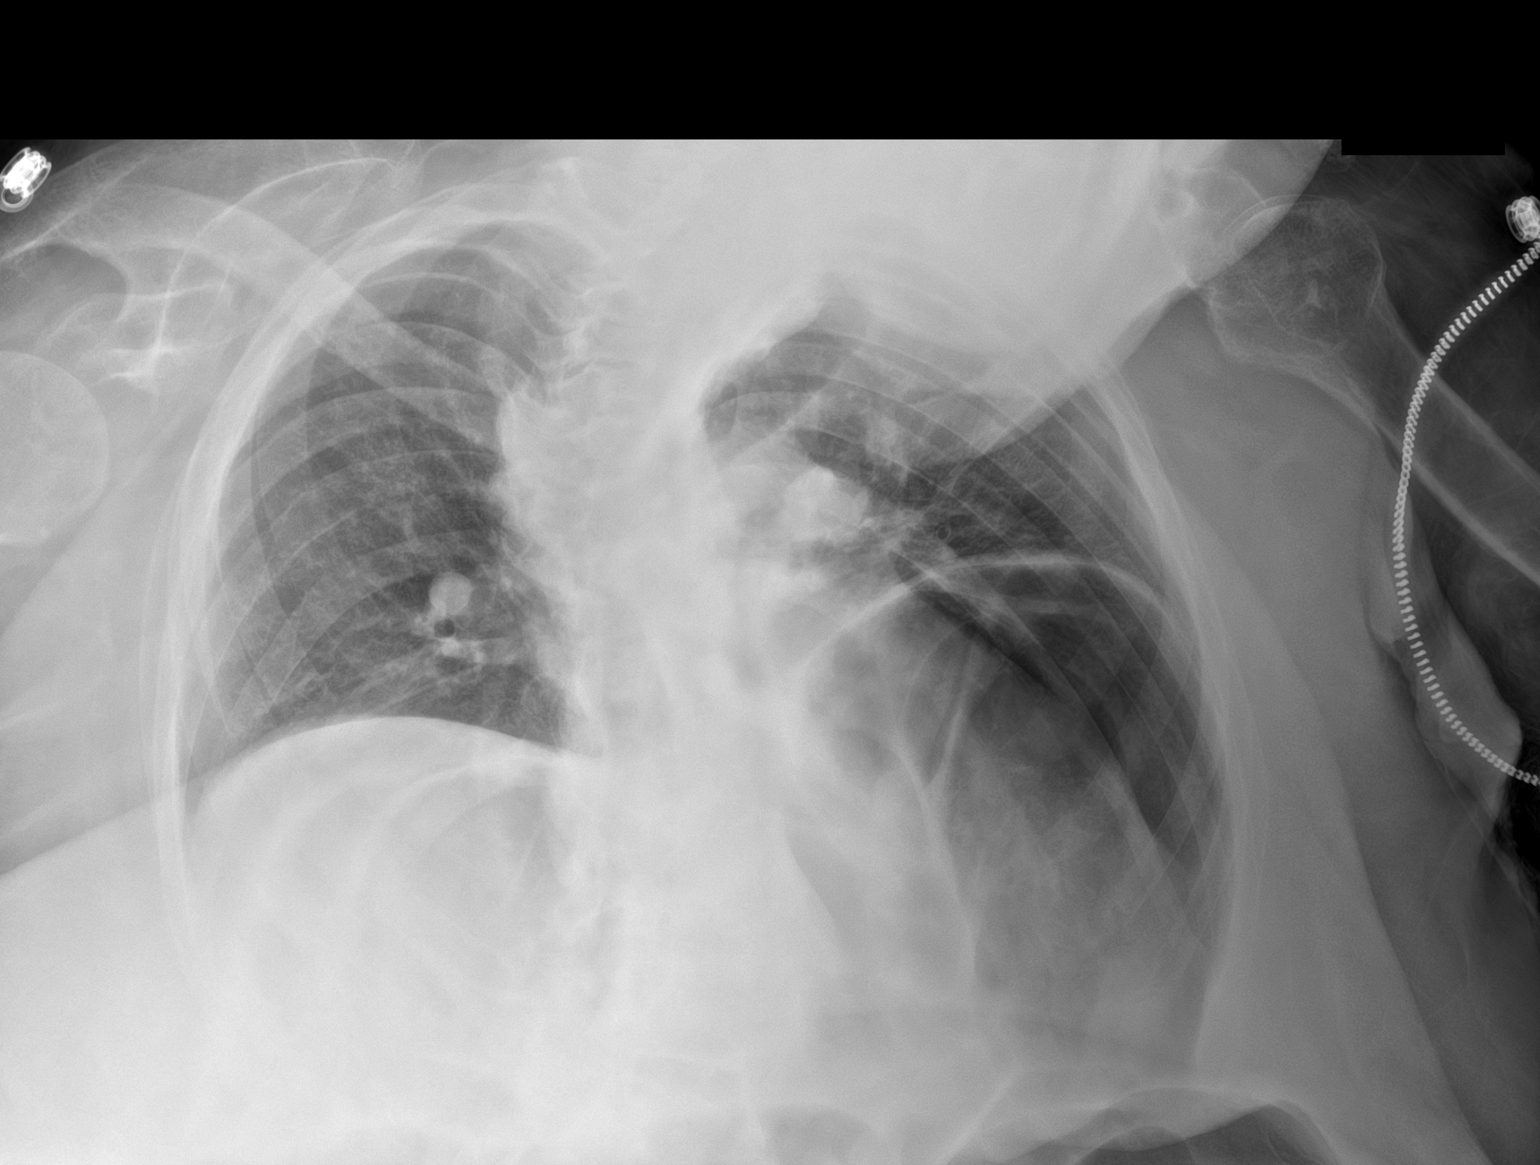

[2 of 2 positions shown; findings below may reference images not displayed]

FINDINGS: Cardiac shadow is stable. The overall inspiratory effort is again
poor similar to that seen on the prior exam. No focal infiltrate or
sizable effusion is seen. S-shaped scoliosis of the thoracolumbar
spine is noted.
IMPRESSION: No acute abnormality noted.
